# Patient Record
Sex: Male | Born: 1955
Health system: Southern US, Community
[De-identification: ages and names within clinical notes are randomized; demographics above are authoritative.]

## PROBLEM LIST (undated history)

## (undated) DIAGNOSIS — I5041 Acute combined systolic (congestive) and diastolic (congestive) heart failure: Secondary | ICD-10-CM

## (undated) DIAGNOSIS — B955 Unspecified streptococcus as the cause of diseases classified elsewhere: Secondary | ICD-10-CM

## (undated) DIAGNOSIS — A491 Streptococcal infection, unspecified site: Secondary | ICD-10-CM

## (undated) DIAGNOSIS — K7460 Unspecified cirrhosis of liver: Secondary | ICD-10-CM

## (undated) DIAGNOSIS — R7881 Bacteremia: Secondary | ICD-10-CM

## (undated) DIAGNOSIS — I10 Essential (primary) hypertension: Secondary | ICD-10-CM

## (undated) DIAGNOSIS — T783XXA Angioneurotic edema, initial encounter: Secondary | ICD-10-CM

## (undated) DIAGNOSIS — I82409 Acute embolism and thrombosis of unspecified deep veins of unspecified lower extremity: Secondary | ICD-10-CM

## (undated) DIAGNOSIS — M795 Residual foreign body in soft tissue: Secondary | ICD-10-CM

## (undated) DIAGNOSIS — F1010 Alcohol abuse, uncomplicated: Secondary | ICD-10-CM

## (undated) DIAGNOSIS — I4892 Unspecified atrial flutter: Secondary | ICD-10-CM

## (undated) HISTORY — PX: HAND SURGERY: SHX662

---

## 2007-11-13 ENCOUNTER — Emergency Department (HOSPITAL_COMMUNITY): Admission: EM | Admit: 2007-11-13 | Discharge: 2007-11-13 | Payer: Self-pay | Admitting: Emergency Medicine

## 2010-10-28 LAB — POCT I-STAT, CHEM 8
Calcium, Ion: 1.21
HCT: 44
Hemoglobin: 15
TCO2: 29

## 2010-10-28 LAB — URINE MICROSCOPIC-ADD ON

## 2010-10-28 LAB — URINALYSIS, ROUTINE W REFLEX MICROSCOPIC
Glucose, UA: NEGATIVE
Leukocytes, UA: NEGATIVE
Nitrite: NEGATIVE
Specific Gravity, Urine: 1.025
pH: 5.5

## 2010-10-28 LAB — GLUCOSE, CAPILLARY: Glucose-Capillary: 83

## 2010-12-02 ENCOUNTER — Emergency Department (HOSPITAL_COMMUNITY)
Admission: EM | Admit: 2010-12-02 | Discharge: 2010-12-02 | Disposition: A | Discharge status: Home / Self Care | Payer: Self-pay | Attending: Emergency Medicine | Admitting: Emergency Medicine

## 2010-12-02 ENCOUNTER — Emergency Department (HOSPITAL_COMMUNITY): Payer: Self-pay

## 2010-12-02 ENCOUNTER — Encounter: Payer: Self-pay | Admitting: Emergency Medicine

## 2010-12-02 DIAGNOSIS — Z794 Long term (current) use of insulin: Secondary | ICD-10-CM | POA: Insufficient documentation

## 2010-12-02 DIAGNOSIS — M940 Chondrocostal junction syndrome [Tietze]: Secondary | ICD-10-CM | POA: Insufficient documentation

## 2010-12-02 DIAGNOSIS — E119 Type 2 diabetes mellitus without complications: Secondary | ICD-10-CM | POA: Insufficient documentation

## 2010-12-02 DIAGNOSIS — I10 Essential (primary) hypertension: Secondary | ICD-10-CM | POA: Insufficient documentation

## 2010-12-02 DIAGNOSIS — R071 Chest pain on breathing: Secondary | ICD-10-CM | POA: Insufficient documentation

## 2010-12-02 HISTORY — DX: Essential (primary) hypertension: I10

## 2010-12-02 MED ORDER — HYDROCODONE-ACETAMINOPHEN 5-325 MG PO TABS: 1.0000 | ORAL_TABLET | ORAL | Status: AC | PRN

## 2010-12-02 NOTE — ED Notes (Signed)
Patient c/o of right flank pain. Denies any nausea, vomiting, fevers, or urinary problems. Patient denies any known injuries.

## 2010-12-02 NOTE — ED Provider Notes (Signed)
History     CSN: [id number] Arrival date & time: 12/02/2010  9:49 AM   First MD Initiated Contact with Patient 12/02/10 1006      Chief Complaint  Patient presents with  . Rib Injury    (Consider location/radiation/quality/duration/timing/severity/associated sxs/prior treatment) Patient is a 55 y.o. male presenting with chest pain. The history is provided by the patient.  Chest Pain Episode onset: 3 weeks ago.  He denies injury,  fall,  heavy lifting , etc. Chest pain occurs intermittently. The chest pain is unchanged. Associated with: Movement and lying on his right side. At its most intense, the pain is at 8/10. The pain is currently at 8/10. The severity of the pain is moderate. The quality of the pain is described as sharp and stabbing. The pain does not radiate. Chest pain is worsened by certain positions. Pertinent negatives for primary symptoms include no fever, no shortness of breath, no cough, no wheezing, no palpitations, no abdominal pain, no nausea and no dizziness.  Pertinent negatives for associated symptoms include no diaphoresis, no lower extremity edema, no numbness, no orthopnea and no weakness. He tried narcotics (He gets relief with hydrocodone.) for the symptoms.  His past medical history is significant for diabetes and hypertension.     Past Medical History  Diagnosis Date  . Hypertension   . Diabetes mellitus     History reviewed. No pertinent past surgical history.  Family History  Problem Relation Age of Onset  . Diabetes Mother     History  Substance Use Topics  . Smoking status: Never Smoker   . Smokeless tobacco: Never Used  . Alcohol Use: No      Review of Systems  Constitutional: Negative for fever and diaphoresis.  HENT: Negative for congestion, sore throat and neck pain.   Eyes: Negative.   Respiratory: Negative for cough, chest tightness, shortness of breath and wheezing.   Cardiovascular: Positive for chest pain. Negative for  palpitations, orthopnea and leg swelling.  Gastrointestinal: Negative for nausea and abdominal pain.  Genitourinary: Negative.  Negative for dysuria, frequency and difficulty urinating.  Musculoskeletal: Negative for joint swelling and arthralgias.  Skin: Negative.  Negative for rash and wound.  Neurological: Negative for dizziness, weakness, light-headedness, numbness and headaches.  Hematological: Negative.   Psychiatric/Behavioral: Negative.     Allergies  Other  Home Medications   Current Outpatient Rx  Name Route Sig Dispense Refill  . HYDROCODONE-ACETAMINOPHEN 5-500 MG PO TABS Oral Take 1 tablet by mouth 2 (two) times daily.      . INSULIN DETEMIR 100 UNIT/ML [place] SOLN Subcutaneous Inject 60 Units into the skin daily.      Marland Kitchen METFORMIN HCL 500 MG PO TABS Oral Take 500 mg by mouth daily.        BP 185/97  Pulse 74  Temp(Src) 98.3 F (36.8 C) (Oral)  Resp 17  Ht 6\' 1"  (1.854 m)  Wt 280 lb (127.007 kg)  BMI 36.94 kg/m2  SpO2 99%  Physical Exam  Nursing note and vitals reviewed. Constitutional: He is oriented to person, place, and time. He appears well-developed and well-nourished.  HENT:  Head: Normocephalic and atraumatic.  Eyes: Conjunctivae are normal.  Neck: Normal range of motion.  Cardiovascular: Normal rate, regular rhythm, normal heart sounds and intact distal pulses.  Exam reveals no friction rub.   No murmur heard. Pulmonary/Chest: Effort normal and breath sounds normal. No respiratory distress. He has no wheezes. He has no rales. He exhibits tenderness. He exhibits no  crepitus, no edema, no deformity and no swelling.    Abdominal: Soft. Bowel sounds are normal. There is no tenderness.  Musculoskeletal: Normal range of motion.  Neurological: He is alert and oriented to person, place, and time.  Skin: Skin is warm and dry.  Psychiatric: He has a normal mood and affect.    ED Course  Procedures (including critical care time)  Labs Reviewed - No data to  display Dg Ribs Unilateral W/chest Right  12/02/2010  *RADIOLOGY REPORT*  Clinical Data: Right anterior rib pain x 3 weeks, no known injury  RIGHT RIBS AND CHEST - 3+ VIEW  Comparison: None.  Findings: Lungs are clear. No pleural effusion or pneumothorax.  Cardiomediastinal silhouette is within normal limits.  No right rib fracture is seen.  IMPRESSION: No evidence of acute cardiopulmonary disease.  No right rib fracture is seen.  Original Report Authenticated By: Charline Bills, M.D.     No diagnosis found.    MDM  Chest wall pain. Hypertension        Candis Musa, PA 12/02/10 1222

## 2010-12-05 NOTE — ED Provider Notes (Signed)
Medical screening examination/treatment/procedure(s) were performed by non-physician practitioner and as supervising physician I was immediately available for consultation/collaboration.   Benny Lennert, MD 12/05/10 [phone]

## 2010-12-24 ENCOUNTER — Other Ambulatory Visit (HOSPITAL_COMMUNITY): Payer: Self-pay | Admitting: "Endocrinology

## 2010-12-24 ENCOUNTER — Ambulatory Visit (HOSPITAL_COMMUNITY)
Admission: RE | Admit: 2010-12-24 | Discharge: 2010-12-24 | Disposition: A | Discharge status: Home / Self Care | Payer: Medicaid Other | Source: Ambulatory Visit | Attending: "Endocrinology | Admitting: "Endocrinology

## 2010-12-24 DIAGNOSIS — R0789 Other chest pain: Secondary | ICD-10-CM

## 2010-12-24 DIAGNOSIS — R079 Chest pain, unspecified: Secondary | ICD-10-CM | POA: Insufficient documentation

## 2011-01-11 ENCOUNTER — Emergency Department (INDEPENDENT_AMBULATORY_CARE_PROVIDER_SITE_OTHER): Payer: Medicaid Other

## 2011-01-11 ENCOUNTER — Encounter (HOSPITAL_COMMUNITY): Payer: Self-pay | Admitting: *Deleted

## 2011-01-11 ENCOUNTER — Other Ambulatory Visit: Payer: Self-pay

## 2011-01-11 ENCOUNTER — Emergency Department (INDEPENDENT_AMBULATORY_CARE_PROVIDER_SITE_OTHER)
Admission: EM | Admit: 2011-01-11 | Discharge: 2011-01-11 | Disposition: A | Discharge status: Home / Self Care | Payer: Medicaid Other | Source: Home / Self Care | Attending: Emergency Medicine | Admitting: Emergency Medicine

## 2011-01-11 DIAGNOSIS — R0789 Other chest pain: Secondary | ICD-10-CM

## 2011-01-11 MED ORDER — PREDNISONE 20 MG PO TABS: ORAL_TABLET | ORAL | Status: AC

## 2011-01-11 MED ORDER — MELOXICAM 15 MG PO TABS: 15.0000 mg | ORAL_TABLET | Freq: Every day | ORAL | Status: AC

## 2011-01-11 MED ORDER — HYDROCODONE-ACETAMINOPHEN 5-325 MG PO TABS: 2.0000 | ORAL_TABLET | ORAL | Status: AC | PRN

## 2011-01-11 NOTE — ED Notes (Signed)
C/O intermittent chest pain x 1 month - reports "it's there every morning when I wake up".  States had CXR per PCP for sxs, but negative results.  Denies having cardiac work-up.  Describes pain as sharp, intermittent to right anterior lower ribs; pain worse with palpation.  Denies SOB, nausea, change w/ deep breathing.  Occasionally pain is worse with certain movement.  Has been taking hydrocodone for back pain (now resolved) - takes care of the rib/chest pain, but pain returns.

## 2011-01-11 NOTE — ED Provider Notes (Signed)
History     CSN: [id number] Arrival date & time: 01/11/2011 12:31 PM   First MD Initiated Contact with Patient 01/11/11 1242      Chief Complaint  Patient presents with  . Chest Pain    HPI Comments: Pt with intermittent sharp nonradiating anterior lower CP x 6 weeks. Lasts seconds/minutes and resolves. Worse with certain movements, palpation. No exertional component.  Seen in ED on 11/6 for identical pain, thought to have costochondritis, sent home with vicodin which pt states helps. Has not tried anything else for this pain. No rash. No recent or remote h/o trauma to this area.   Patient is a 55 y.o. male presenting with chest pain. The history is provided by the patient.  Chest Pain The chest pain began more  than 1 month ago. Chest pain occurs intermittently. The chest pain is unchanged. The pain is associated with breathing and coughing. The quality of the pain is described as sharp and similar to previous episodes. The pain does not radiate. Pertinent negatives for primary symptoms include no fever, no fatigue, no syncope, no shortness of breath, no cough, no wheezing, no palpitations, no abdominal pain, no nausea, no vomiting and no dizziness.  Pertinent negatives for associated symptoms include no diaphoresis, no lower extremity edema, no near-syncope, no numbness and no weakness. He tried narcotics for the symptoms. Risk factors include male gender and obesity.  His past medical history is significant for hypertension.     Past Medical History  Diagnosis Date  . Hypertension   . Diabetes mellitus     History reviewed. No pertinent past surgical history.  Family History  Problem Relation Age of Onset  . Diabetes Mother     History  Substance Use Topics  . Smoking status: Never Smoker   . Smokeless tobacco: Never Used  . Alcohol Use: No      Review of Systems  Constitutional: Negative for fever, diaphoresis and fatigue.  Respiratory: Negative for cough, chest  tightness, shortness of breath and wheezing.   Cardiovascular: Positive for chest pain. Negative for palpitations, leg swelling, syncope and near-syncope.  Gastrointestinal: Negative for nausea, vomiting and abdominal pain.  Musculoskeletal: Negative for back pain.  Skin: Negative for rash.  Neurological: Negative for dizziness, weakness and numbness.    Allergies  Other  Home Medications   Current Outpatient Rx  Name Route Sig Dispense Refill  . INSULIN DETEMIR 100 UNIT/ML [place] SOLN Subcutaneous Inject 60 Units into the skin daily.      Marland Kitchen LISINOPRIL PO Oral Take by mouth.      . METFORMIN HCL 500 MG PO TABS Oral Take 500 mg by mouth daily.      Marland Kitchen HYDROCODONE-ACETAMINOPHEN 5-325 MG PO TABS Oral Take 2 tablets by mouth every 4 (four) hours as needed for pain. 20 tablet 0  . MELOXICAM 15 MG PO TABS Oral Take 1 tablet (15 mg total) by mouth daily. 14 tablet 0  . PREDNISONE 20 MG PO TABS  Take 3 tabs po on first day, 2 tabs second day, 2 tabs third day, 1 tab fourth day, 1 tab 5th day. Take with food. 9 tablet 0  . UNKNOWN TO PATIENT  Blood pressure medicine - unknown name      BP 181/83  Pulse 81  Temp(Src) 97.8 F (36.6 C) (Oral)  Resp 20  SpO2 96%  Physical Exam  Nursing note and vitals reviewed. Constitutional: He is oriented to person, place, and time. He appears well-developed and well-nourished.  HENT:  Head: Normocephalic and atraumatic.  Eyes: Conjunctivae and EOM are normal. Pupils are equal, round, and reactive to light.  Neck: Normal range of motion.  Cardiovascular: Normal rate, regular rhythm, normal heart sounds and intact distal pulses.  Exam reveals no friction rub.   No murmur heard. Pulmonary/Chest: Effort normal and breath sounds normal. No respiratory distress. He exhibits tenderness. He exhibits no crepitus, no deformity and no swelling.         Tenderness at costochondral junction. Good inspiratory effort.  Abdominal: Soft. Bowel sounds are normal. He  exhibits no distension and no mass. There is no tenderness. There is no rebound and no guarding.  Musculoskeletal: Normal range of motion.  Neurological: He is alert and oriented to person, place, and time.  Skin: Skin is warm and dry. No rash noted.  Psychiatric: He has a normal mood and affect. His behavior is normal.    ED Course  Procedures (including critical care time)  Labs Reviewed - No data to display Dg Chest 2 View  01/11/2011  *RADIOLOGY REPORT*  Clinical Data: Chest pain for 6 weeks  CHEST - 2 VIEW  Comparison: None  Findings: The heart size and mediastinal contours are within normal limits.  Both lungs are clear.  The visualized skeletal structures are unremarkable.  IMPRESSION: No active cardiopulmonary abnormalities.  Original Report Authenticated By: Rosealee Albee, M.D.     1. Musculoskeletal chest pain       MDM  Prev records, labs, imaging reviewed. BP noted. Was in this range on last visit on 12/02/2010.  Imaging reviewed by myself. Discussed with rads. No PNA, [facility] per them. [place] narcotic database queried. 1 narcotic Rx from ED on 11/6. No other record of narcotic rx. Prev BUN/cr WNL. Pt reports no h/o renal insufficiency.   Pt appears to be in NAD, non-toxic, VSS. No evidence of STEMI or acute ischemic changes on EKG. Doubt ACS, PE, ruptured esophagus, aortic dissection, PNA or PTX. H&P most c/w MSK CP. Will send home with meloxicam, steriods, and vicodin for presumed costochondritis. Instructed pt that prednisone will elevate his blood sugar and discussed importance of taking BP medication/ checking BP at home. Pt to f/u with PMD at the [place] in 2 weeks if no improvement.    Date: 01/11/2011  Rate: 73  Rhythm: NSR. 1 PVC noted.   QRS Axis: normal  Intervals: normal  ST/T Wave abnormalities: nonspecific T wave changes  Conduction Disutrbances:none  Narrative Interpretation:   Old EKG Reviewed: none available   Luiz Blare, MD 01/11/11 1453

## 2014-04-13 ENCOUNTER — Emergency Department (HOSPITAL_COMMUNITY): Payer: Medicare Other

## 2014-04-13 ENCOUNTER — Inpatient Hospital Stay (HOSPITAL_COMMUNITY)
Admission: EM | Admit: 2014-04-13 | Discharge: 2014-04-30 | DRG: 291 | Disposition: A | Discharge status: Skilled Nursing Facility | Payer: Medicare Other | Attending: Family Medicine | Admitting: Family Medicine

## 2014-04-13 ENCOUNTER — Encounter (HOSPITAL_COMMUNITY): Payer: Self-pay | Admitting: Emergency Medicine

## 2014-04-13 DIAGNOSIS — I10 Essential (primary) hypertension: Secondary | ICD-10-CM | POA: Diagnosis not present

## 2014-04-13 DIAGNOSIS — N179 Acute kidney failure, unspecified: Secondary | ICD-10-CM | POA: Diagnosis present

## 2014-04-13 DIAGNOSIS — F10931 Alcohol use, unspecified with withdrawal delirium: Secondary | ICD-10-CM | POA: Diagnosis present

## 2014-04-13 DIAGNOSIS — Z794 Long term (current) use of insulin: Secondary | ICD-10-CM | POA: Diagnosis not present

## 2014-04-13 DIAGNOSIS — D649 Anemia, unspecified: Secondary | ICD-10-CM | POA: Diagnosis present

## 2014-04-13 DIAGNOSIS — K7460 Unspecified cirrhosis of liver: Secondary | ICD-10-CM | POA: Diagnosis present

## 2014-04-13 DIAGNOSIS — R220 Localized swelling, mass and lump, head: Secondary | ICD-10-CM | POA: Diagnosis not present

## 2014-04-13 DIAGNOSIS — I5031 Acute diastolic (congestive) heart failure: Secondary | ICD-10-CM | POA: Diagnosis not present

## 2014-04-13 DIAGNOSIS — R0602 Shortness of breath: Secondary | ICD-10-CM | POA: Diagnosis not present

## 2014-04-13 DIAGNOSIS — I5041 Acute combined systolic (congestive) and diastolic (congestive) heart failure: Secondary | ICD-10-CM | POA: Diagnosis not present

## 2014-04-13 DIAGNOSIS — M795 Residual foreign body in soft tissue: Secondary | ICD-10-CM | POA: Diagnosis not present

## 2014-04-13 DIAGNOSIS — I4892 Unspecified atrial flutter: Secondary | ICD-10-CM | POA: Diagnosis not present

## 2014-04-13 DIAGNOSIS — Y95 Nosocomial condition: Secondary | ICD-10-CM | POA: Diagnosis present

## 2014-04-13 DIAGNOSIS — R000 Tachycardia, unspecified: Secondary | ICD-10-CM | POA: Diagnosis not present

## 2014-04-13 DIAGNOSIS — A408 Other streptococcal sepsis: Secondary | ICD-10-CM | POA: Diagnosis not present

## 2014-04-13 DIAGNOSIS — Z452 Encounter for adjustment and management of vascular access device: Secondary | ICD-10-CM | POA: Diagnosis not present

## 2014-04-13 DIAGNOSIS — A409 Streptococcal sepsis, unspecified: Secondary | ICD-10-CM | POA: Diagnosis not present

## 2014-04-13 DIAGNOSIS — R0600 Dyspnea, unspecified: Secondary | ICD-10-CM

## 2014-04-13 DIAGNOSIS — T783XXA Angioneurotic edema, initial encounter: Secondary | ICD-10-CM | POA: Diagnosis present

## 2014-04-13 DIAGNOSIS — I82409 Acute embolism and thrombosis of unspecified deep veins of unspecified lower extremity: Secondary | ICD-10-CM

## 2014-04-13 DIAGNOSIS — I471 Supraventricular tachycardia: Secondary | ICD-10-CM

## 2014-04-13 DIAGNOSIS — R778 Other specified abnormalities of plasma proteins: Secondary | ICD-10-CM

## 2014-04-13 DIAGNOSIS — I426 Alcoholic cardiomyopathy: Secondary | ICD-10-CM | POA: Diagnosis present

## 2014-04-13 DIAGNOSIS — I5043 Acute on chronic combined systolic (congestive) and diastolic (congestive) heart failure: Principal | ICD-10-CM | POA: Diagnosis present

## 2014-04-13 DIAGNOSIS — T502X5A Adverse effect of carbonic-anhydrase inhibitors, benzothiadiazides and other diuretics, initial encounter: Secondary | ICD-10-CM | POA: Diagnosis present

## 2014-04-13 DIAGNOSIS — I509 Heart failure, unspecified: Secondary | ICD-10-CM | POA: Diagnosis not present

## 2014-04-13 DIAGNOSIS — R918 Other nonspecific abnormal finding of lung field: Secondary | ICD-10-CM | POA: Diagnosis not present

## 2014-04-13 DIAGNOSIS — B955 Unspecified streptococcus as the cause of diseases classified elsewhere: Secondary | ICD-10-CM

## 2014-04-13 DIAGNOSIS — E669 Obesity, unspecified: Secondary | ICD-10-CM | POA: Diagnosis present

## 2014-04-13 DIAGNOSIS — Z833 Family history of diabetes mellitus: Secondary | ICD-10-CM

## 2014-04-13 DIAGNOSIS — I2699 Other pulmonary embolism without acute cor pulmonale: Secondary | ICD-10-CM | POA: Diagnosis present

## 2014-04-13 DIAGNOSIS — T464X5A Adverse effect of angiotensin-converting-enzyme inhibitors, initial encounter: Secondary | ICD-10-CM | POA: Diagnosis present

## 2014-04-13 DIAGNOSIS — E876 Hypokalemia: Secondary | ICD-10-CM | POA: Diagnosis present

## 2014-04-13 DIAGNOSIS — J690 Pneumonitis due to inhalation of food and vomit: Secondary | ICD-10-CM | POA: Diagnosis not present

## 2014-04-13 DIAGNOSIS — I517 Cardiomegaly: Secondary | ICD-10-CM | POA: Diagnosis not present

## 2014-04-13 DIAGNOSIS — A491 Streptococcal infection, unspecified site: Secondary | ICD-10-CM

## 2014-04-13 DIAGNOSIS — I472 Ventricular tachycardia: Secondary | ICD-10-CM | POA: Diagnosis present

## 2014-04-13 DIAGNOSIS — E119 Type 2 diabetes mellitus without complications: Secondary | ICD-10-CM

## 2014-04-13 DIAGNOSIS — S60551A Superficial foreign body of right hand, initial encounter: Secondary | ICD-10-CM | POA: Diagnosis not present

## 2014-04-13 DIAGNOSIS — I82812 Embolism and thrombosis of superficial veins of left lower extremities: Secondary | ICD-10-CM | POA: Diagnosis not present

## 2014-04-13 DIAGNOSIS — E1165 Type 2 diabetes mellitus with hyperglycemia: Secondary | ICD-10-CM | POA: Diagnosis present

## 2014-04-13 DIAGNOSIS — J9811 Atelectasis: Secondary | ICD-10-CM | POA: Diagnosis not present

## 2014-04-13 DIAGNOSIS — E870 Hyperosmolality and hypernatremia: Secondary | ICD-10-CM | POA: Diagnosis not present

## 2014-04-13 DIAGNOSIS — T884XXA Failed or difficult intubation, initial encounter: Secondary | ICD-10-CM

## 2014-04-13 DIAGNOSIS — R748 Abnormal levels of other serum enzymes: Secondary | ICD-10-CM | POA: Diagnosis not present

## 2014-04-13 DIAGNOSIS — R600 Localized edema: Secondary | ICD-10-CM

## 2014-04-13 DIAGNOSIS — I4891 Unspecified atrial fibrillation: Secondary | ICD-10-CM | POA: Diagnosis present

## 2014-04-13 DIAGNOSIS — Z4682 Encounter for fitting and adjustment of non-vascular catheter: Secondary | ICD-10-CM | POA: Diagnosis not present

## 2014-04-13 DIAGNOSIS — J9690 Respiratory failure, unspecified, unspecified whether with hypoxia or hypercapnia: Secondary | ICD-10-CM

## 2014-04-13 DIAGNOSIS — Z9911 Dependence on respirator [ventilator] status: Secondary | ICD-10-CM

## 2014-04-13 DIAGNOSIS — J9601 Acute respiratory failure with hypoxia: Secondary | ICD-10-CM | POA: Diagnosis present

## 2014-04-13 DIAGNOSIS — R188 Other ascites: Secondary | ICD-10-CM | POA: Diagnosis not present

## 2014-04-13 DIAGNOSIS — I5040 Unspecified combined systolic (congestive) and diastolic (congestive) heart failure: Secondary | ICD-10-CM | POA: Diagnosis not present

## 2014-04-13 DIAGNOSIS — I82401 Acute embolism and thrombosis of unspecified deep veins of right lower extremity: Secondary | ICD-10-CM | POA: Diagnosis not present

## 2014-04-13 DIAGNOSIS — I82443 Acute embolism and thrombosis of tibial vein, bilateral: Secondary | ICD-10-CM | POA: Diagnosis not present

## 2014-04-13 DIAGNOSIS — F10231 Alcohol dependence with withdrawal delirium: Secondary | ICD-10-CM | POA: Diagnosis present

## 2014-04-13 DIAGNOSIS — T783XXS Angioneurotic edema, sequela: Secondary | ICD-10-CM | POA: Diagnosis not present

## 2014-04-13 DIAGNOSIS — A419 Sepsis, unspecified organism: Secondary | ICD-10-CM

## 2014-04-13 DIAGNOSIS — I82403 Acute embolism and thrombosis of unspecified deep veins of lower extremity, bilateral: Secondary | ICD-10-CM | POA: Diagnosis not present

## 2014-04-13 DIAGNOSIS — R7989 Other specified abnormal findings of blood chemistry: Secondary | ICD-10-CM

## 2014-04-13 DIAGNOSIS — F1010 Alcohol abuse, uncomplicated: Secondary | ICD-10-CM

## 2014-04-13 DIAGNOSIS — T82898A Other specified complication of vascular prosthetic devices, implants and grafts, initial encounter: Secondary | ICD-10-CM | POA: Diagnosis not present

## 2014-04-13 DIAGNOSIS — T783XXD Angioneurotic edema, subsequent encounter: Secondary | ICD-10-CM | POA: Diagnosis not present

## 2014-04-13 DIAGNOSIS — J9691 Respiratory failure, unspecified with hypoxia: Secondary | ICD-10-CM | POA: Diagnosis not present

## 2014-04-13 DIAGNOSIS — M7989 Other specified soft tissue disorders: Secondary | ICD-10-CM | POA: Diagnosis not present

## 2014-04-13 DIAGNOSIS — R7881 Bacteremia: Secondary | ICD-10-CM

## 2014-04-13 DIAGNOSIS — Z9889 Other specified postprocedural states: Secondary | ICD-10-CM

## 2014-04-13 DIAGNOSIS — B954 Other streptococcus as the cause of diseases classified elsewhere: Secondary | ICD-10-CM | POA: Diagnosis not present

## 2014-04-13 DIAGNOSIS — R6520 Severe sepsis without septic shock: Secondary | ICD-10-CM | POA: Diagnosis not present

## 2014-04-13 DIAGNOSIS — R109 Unspecified abdominal pain: Secondary | ICD-10-CM

## 2014-04-13 DIAGNOSIS — R0989 Other specified symptoms and signs involving the circulatory and respiratory systems: Secondary | ICD-10-CM | POA: Diagnosis not present

## 2014-04-13 HISTORY — DX: Residual foreign body in soft tissue: M79.5

## 2014-04-13 HISTORY — DX: Alcohol abuse, uncomplicated: F10.10

## 2014-04-13 HISTORY — DX: Acute combined systolic (congestive) and diastolic (congestive) heart failure: I50.41

## 2014-04-13 HISTORY — DX: Acute embolism and thrombosis of unspecified deep veins of unspecified lower extremity: I82.409

## 2014-04-13 HISTORY — DX: Angioneurotic edema, initial encounter: T78.3XXA

## 2014-04-13 HISTORY — DX: Bacteremia: R78.81

## 2014-04-13 HISTORY — DX: Streptococcal infection, unspecified site: A49.1

## 2014-04-13 HISTORY — DX: Unspecified streptococcus as the cause of diseases classified elsewhere: B95.5

## 2014-04-13 HISTORY — DX: Unspecified cirrhosis of liver: K74.60

## 2014-04-13 HISTORY — DX: Unspecified atrial flutter: I48.92

## 2014-04-13 LAB — TSH: TSH: 1.213 u[IU]/mL (ref 0.350–4.500)

## 2014-04-13 LAB — BASIC METABOLIC PANEL
ANION GAP: 10 (ref 5–15)
BUN: 29 mg/dL — ABNORMAL HIGH (ref 6–23)
CALCIUM: 9.2 mg/dL (ref 8.4–10.5)
CO2: 26 mmol/L (ref 19–32)
Chloride: 99 mmol/L (ref 96–112)
Creatinine, Ser: 1.98 mg/dL — ABNORMAL HIGH (ref 0.50–1.35)
GFR calc Af Amer: 40 mL/min — ABNORMAL LOW (ref >90)
GFR calc non Af Amer: 35 mL/min — ABNORMAL LOW (ref >90)
Glucose, Bld: 95 mg/dL (ref 70–99)
Potassium: 3.7 mmol/L (ref 3.5–5.1)
SODIUM: 135 mmol/L (ref 135–145)

## 2014-04-13 LAB — TROPONIN I
TROPONIN I: 0.09 ng/mL — AB (ref <0.031)
Troponin I: 0.08 ng/mL — ABNORMAL HIGH (ref <0.031)
Troponin I: 0.05 ng/mL — ABNORMAL HIGH (ref <0.031)

## 2014-04-13 LAB — HEPATIC FUNCTION PANEL
ALBUMIN: 3.7 g/dL (ref 3.5–5.2)
ALT: 30 U/L (ref 0–53)
AST: 61 U/L — AB (ref 0–37)
Alkaline Phosphatase: 97 U/L (ref 39–117)
BILIRUBIN TOTAL: 2.1 mg/dL — AB (ref 0.3–1.2)
Bilirubin, Direct: 0.8 mg/dL — ABNORMAL HIGH (ref 0.0–0.5)
Indirect Bilirubin: 1.3 mg/dL — ABNORMAL HIGH (ref 0.3–0.9)
Total Protein: 7.2 g/dL (ref 6.0–8.3)

## 2014-04-13 LAB — CBC WITH DIFFERENTIAL/PLATELET
BASOS ABS: 0 10*3/uL (ref 0.0–0.1)
BASOS PCT: 1 % (ref 0–1)
Eosinophils Absolute: 0.2 10*3/uL (ref 0.0–0.7)
Eosinophils Relative: 3 % (ref 0–5)
HEMATOCRIT: 38.9 % — AB (ref 39.0–52.0)
Hemoglobin: 13.2 g/dL (ref 13.0–17.0)
Lymphocytes Relative: 23 % (ref 12–46)
Lymphs Abs: 1.5 10*3/uL (ref 0.7–4.0)
MCH: 31.9 pg (ref 26.0–34.0)
MCHC: 33.9 g/dL (ref 30.0–36.0)
MCV: 94 fL (ref 78.0–100.0)
MONO ABS: 0.6 10*3/uL (ref 0.1–1.0)
Monocytes Relative: 9 % (ref 3–12)
NEUTROS ABS: 4.3 10*3/uL (ref 1.7–7.7)
NEUTROS PCT: 64 % (ref 43–77)
Platelets: 151 10*3/uL (ref 150–400)
RBC: 4.14 MIL/uL — ABNORMAL LOW (ref 4.22–5.81)
RDW: 13.4 % (ref 11.5–15.5)
WBC: 6.7 10*3/uL (ref 4.0–10.5)

## 2014-04-13 LAB — GLUCOSE, CAPILLARY: Glucose-Capillary: 123 mg/dL — ABNORMAL HIGH (ref 70–99)

## 2014-04-13 LAB — BRAIN NATRIURETIC PEPTIDE: B NATRIURETIC PEPTIDE 5: 617 pg/mL — AB (ref 0.0–100.0)

## 2014-04-13 LAB — MRSA PCR SCREENING: MRSA BY PCR: POSITIVE — AB

## 2014-04-13 MED ORDER — DILTIAZEM LOAD VIA INFUSION
15.0000 mg | Freq: Once | INTRAVENOUS | Status: DC
  Filled 2014-04-13: qty 15

## 2014-04-13 MED ORDER — DILTIAZEM HCL 100 MG IV SOLR
10.0000 mg/h | INTRAVENOUS | Status: DC
  Administered 2014-04-14 (×2): 10 mg/h via INTRAVENOUS
  Administered 2014-04-14: 7.5 mg/h via INTRAVENOUS
  Filled 2014-04-13: qty 100

## 2014-04-13 MED ORDER — INSULIN ASPART 100 UNIT/ML ~~LOC~~ SOLN
0.0000 [IU] | Freq: Three times a day (TID) | SUBCUTANEOUS | Status: DC
  Administered 2014-04-13: 2 [IU] via SUBCUTANEOUS
  Administered 2014-04-14 (×2): 3 [IU] via SUBCUTANEOUS
  Administered 2014-04-14: 5 [IU] via SUBCUTANEOUS
  Administered 2014-04-16: 2 [IU] via SUBCUTANEOUS

## 2014-04-13 MED ORDER — METHYLPREDNISOLONE SODIUM SUCC 125 MG IJ SOLR
80.0000 mg | Freq: Once | INTRAMUSCULAR | Status: AC
  Administered 2014-04-13: 80 mg via INTRAVENOUS
  Filled 2014-04-13: qty 2

## 2014-04-13 MED ORDER — THIAMINE HCL 100 MG/ML IJ SOLN
100.0000 mg | Freq: Every day | INTRAMUSCULAR | Status: DC
  Administered 2014-04-16 – 2014-04-22 (×2): 100 mg via INTRAVENOUS
  Filled 2014-04-13 (×2): qty 2

## 2014-04-13 MED ORDER — DIPHENHYDRAMINE HCL 50 MG/ML IJ SOLN
25.0000 mg | Freq: Four times a day (QID) | INTRAMUSCULAR | Status: DC | PRN
  Administered 2014-04-18 – 2014-04-23 (×7): 25 mg via INTRAVENOUS
  Filled 2014-04-13 (×8): qty 1

## 2014-04-13 MED ORDER — INSULIN DETEMIR 100 UNIT/ML ~~LOC~~ SOLN
60.0000 [IU] | Freq: Every day | SUBCUTANEOUS | Status: DC
Start: 2014-04-13 — End: 2014-04-15
  Administered 2014-04-13 – 2014-04-14 (×2): 60 [IU] via SUBCUTANEOUS
  Filled 2014-04-13 (×4): qty 0.6

## 2014-04-13 MED ORDER — FUROSEMIDE 10 MG/ML IJ SOLN
40.0000 mg | Freq: Every day | INTRAMUSCULAR | Status: DC
  Administered 2014-04-13: 40 mg via INTRAVENOUS
  Filled 2014-04-13: qty 4

## 2014-04-13 MED ORDER — LORAZEPAM 0.5 MG PO TABS
1.0000 mg | ORAL_TABLET | Freq: Four times a day (QID) | ORAL | Status: AC | PRN
  Administered 2014-04-15: 1 mg via ORAL
  Filled 2014-04-13: qty 2

## 2014-04-13 MED ORDER — INSULIN ASPART 100 UNIT/ML ~~LOC~~ SOLN
0.0000 [IU] | Freq: Every day | SUBCUTANEOUS | Status: DC
  Administered 2014-04-13: 3 [IU] via SUBCUTANEOUS

## 2014-04-13 MED ORDER — LORAZEPAM 2 MG/ML IJ SOLN: 0.0000 mg | Freq: Four times a day (QID) | INTRAMUSCULAR | Status: AC

## 2014-04-13 MED ORDER — VITAMIN B-1 100 MG PO TABS
100.0000 mg | ORAL_TABLET | Freq: Every day | ORAL | Status: DC
  Administered 2014-04-13 – 2014-04-30 (×16): 100 mg via ORAL
  Filled 2014-04-13 (×16): qty 1

## 2014-04-13 MED ORDER — ACETAMINOPHEN 325 MG PO TABS: 650.0000 mg | ORAL_TABLET | Freq: Four times a day (QID) | ORAL | Status: DC | PRN

## 2014-04-13 MED ORDER — PREDNISONE 20 MG PO TABS
40.0000 mg | ORAL_TABLET | Freq: Every day | ORAL | Status: DC
Start: 2014-04-14 — End: 2014-04-14
  Administered 2014-04-14: 40 mg via ORAL
  Filled 2014-04-13: qty 2

## 2014-04-13 MED ORDER — FUROSEMIDE 10 MG/ML IJ SOLN
40.0000 mg | Freq: Three times a day (TID) | INTRAMUSCULAR | Status: DC
  Administered 2014-04-13 – 2014-04-15 (×5): 40 mg via INTRAVENOUS
  Filled 2014-04-13 (×5): qty 4

## 2014-04-13 MED ORDER — FOLIC ACID 1 MG PO TABS
1.0000 mg | ORAL_TABLET | Freq: Every day | ORAL | Status: DC
  Administered 2014-04-13 – 2014-04-15 (×3): 1 mg via ORAL
  Filled 2014-04-13 (×3): qty 1

## 2014-04-13 MED ORDER — ONDANSETRON HCL 4 MG/2ML IJ SOLN
4.0000 mg | Freq: Four times a day (QID) | INTRAMUSCULAR | Status: DC | PRN
  Administered 2014-04-23: 4 mg via INTRAVENOUS
  Filled 2014-04-13: qty 2

## 2014-04-13 MED ORDER — DILTIAZEM HCL 25 MG/5ML IV SOLN
INTRAVENOUS | Status: AC
  Administered 2014-04-13: 10 mg via INTRAVENOUS
  Filled 2014-04-13: qty 5

## 2014-04-13 MED ORDER — ACETAMINOPHEN 650 MG RE SUPP: 650.0000 mg | Freq: Four times a day (QID) | RECTAL | Status: DC | PRN

## 2014-04-13 MED ORDER — LORAZEPAM 2 MG/ML IJ SOLN
1.0000 mg | Freq: Once | INTRAMUSCULAR | Status: AC
  Administered 2014-04-13: 1 mg via INTRAVENOUS
  Filled 2014-04-13: qty 1

## 2014-04-13 MED ORDER — DILTIAZEM HCL 100 MG IV SOLR
5.0000 mg/h | Freq: Once | INTRAVENOUS | Status: DC
  Filled 2014-04-13: qty 100

## 2014-04-13 MED ORDER — ONDANSETRON HCL 4 MG PO TABS: 4.0000 mg | ORAL_TABLET | Freq: Four times a day (QID) | ORAL | Status: DC | PRN

## 2014-04-13 MED ORDER — DILTIAZEM HCL 50 MG/10ML IV SOLN
10.0000 mg | Freq: Once | INTRAVENOUS | Status: AC
  Administered 2014-04-13: 10 mg via INTRAVENOUS

## 2014-04-13 MED ORDER — LORAZEPAM 2 MG/ML IJ SOLN
0.0000 mg | Freq: Two times a day (BID) | INTRAMUSCULAR | Status: AC
Start: 2014-04-15 — End: 2014-04-17
  Administered 2014-04-15 – 2014-04-16 (×2): 2 mg via INTRAVENOUS
  Administered 2014-04-16 – 2014-04-17 (×2): 4 mg via INTRAVENOUS
  Filled 2014-04-13 (×2): qty 2
  Filled 2014-04-13: qty 1
  Filled 2014-04-13: qty 2

## 2014-04-13 MED ORDER — SODIUM CHLORIDE 0.9 % IJ SOLN
3.0000 mL | Freq: Two times a day (BID) | INTRAMUSCULAR | Status: DC
  Administered 2014-04-13 – 2014-04-29 (×16): 3 mL via INTRAVENOUS

## 2014-04-13 MED ORDER — ADULT MULTIVITAMIN W/MINERALS CH
1.0000 | ORAL_TABLET | Freq: Every day | ORAL | Status: DC
  Administered 2014-04-13 – 2014-04-15 (×3): 1 via ORAL
  Filled 2014-04-13 (×3): qty 1

## 2014-04-13 MED ORDER — ASPIRIN 325 MG PO TABS
325.0000 mg | ORAL_TABLET | Freq: Once | ORAL | Status: AC
Start: 2014-04-13 — End: 2014-04-13
  Administered 2014-04-13: 325 mg via ORAL
  Filled 2014-04-13: qty 1

## 2014-04-13 MED ORDER — HEPARIN SODIUM (PORCINE) 5000 UNIT/ML IJ SOLN
5000.0000 [IU] | Freq: Three times a day (TID) | INTRAMUSCULAR | Status: DC
  Administered 2014-04-13 – 2014-04-16 (×10): 5000 [IU] via SUBCUTANEOUS
  Filled 2014-04-13 (×10): qty 1

## 2014-04-13 MED ORDER — LORAZEPAM 2 MG/ML IJ SOLN: 1.0000 mg | Freq: Four times a day (QID) | INTRAMUSCULAR | Status: AC | PRN

## 2014-04-13 NOTE — ED Notes (Signed)
EDP in room to see pt. 

## 2014-04-13 NOTE — H&P (Signed)
[facility] History and Physical  Gabriel SeminoleRalph E Merk EAV:[id number] DOB: [birth year] DOA: 04/13/2014  Referring physician: Emergency Department PCP: Pcp Not In System  Specialists:   Chief Complaint: LE swelling  HPI: Gabriel Reynolds is a 59 y.o. male  With a hx of DM who presents with complaints of generalized edema and facial swelling in the setting of lisinopril. In the ED, the patient was noted to have marked LE edema with an enlarged heart seen on CXR. BNP was elevated at 617 with initial troponin of 0.09. The patient was also tachycardic with HR in the 120's, with patient receiving diltazem in ED. Cardiology was consulted and hospitalist was consulted for consideration for admission.  Review of Systems: Review of Systems  Constitutional: Negative for fever, weight loss and diaphoresis.  HENT: Negative for ear pain.   Eyes: Negative for pain and discharge.  Respiratory: Positive for cough. Negative for shortness of breath and stridor.   Cardiovascular: Positive for leg swelling. Negative for chest pain and orthopnea.  Gastrointestinal: Negative for nausea and abdominal pain.  Genitourinary: Negative for dysuria and urgency.  Musculoskeletal: Negative for back pain and joint pain.  Neurological: Negative for seizures and loss of consciousness.  Psychiatric/Behavioral: Negative for depression. The patient is not nervous/anxious.      Past Medical History  Diagnosis Date  . Hypertension   . Diabetes mellitus    Past Surgical History  Procedure Laterality Date  . Hand surgery     Social History:  reports that he has never smoked. He has never used smokeless tobacco. He reports that he drinks about 4.2 oz of alcohol per week. He reports that he does not use illicit drugs.  where does patient live--home, ALF, SNF? and with whom if at home?  Can patient participate in ADLs?  Allergies  Allergen Reactions  . Other Hives    wool    Family History  Problem Relation Age of  Onset  . Diabetes Mother     (be sure to complete)  Prior to Admission medications   Medication Sig Start Date End Date Taking? Authorizing Provider  insulin detemir (LEVEMIR) 100 UNIT/ML injection Inject 60 Units into the skin daily.     Yes Historical Provider, MD  metFORMIN (GLUCOPHAGE) 500 MG tablet Take 500 mg by mouth daily.     Yes Historical Provider, MD   Physical Exam: Filed Vitals:   04/13/14 1040  BP: 134/99  Pulse: 125  Temp: 98.5 F (36.9 C)  TempSrc: Oral  Resp: 18  Height: 6\' 1"  (1.854 m)  Weight: 127.007 kg (280 lb)  SpO2: 99%     General:  Awake, in nad  Eyes: PERRL B  ENT: membranes moist, dentition fair  Neck: trachea midline, neck supple  Cardiovascular: tachycardic, s1, s2  Respiratory: normal resp effort, no wheezing  Abdomen: soft,nondistended  Skin: normal skin turgor, no abnormal skin lesions seen  Musculoskeletal: perfused, no clubbing, 2-3+ LE pitting edema  Psychiatric: mood/affect normal // no auditory/visual hallucinations  Neurologic: cn2-12 intact, strength/sensation intact  Labs on Admission:  Basic Metabolic Panel:  Recent Labs Lab 04/13/14 1110  NA 135  K 3.7  CL 99  CO2 26  GLUCOSE 95  BUN 29*  CREATININE 1.98*  CALCIUM 9.2   Liver Function Tests: No results for input(s): AST, ALT, ALKPHOS, BILITOT, PROT, ALBUMIN in the last 168 hours. No results for input(s): LIPASE, AMYLASE in the last 168 hours. No results for input(s): AMMONIA in the last 168 hours. CBC:  Recent Labs Lab 04/13/14 1110  WBC 6.7  NEUTROABS 4.3  HGB 13.2  HCT 38.9*  MCV 94.0  PLT 151   Cardiac Enzymes:  Recent Labs Lab 04/13/14 1110  TROPONINI 0.09*    BNP (last 3 results)  Recent Labs  04/13/14 1110  BNP 617.0*    ProBNP (last 3 results) No results for input(s): PROBNP in the last 8760 hours.  CBG: No results for input(s): GLUCAP in the last 168 hours.  Radiological Exams on Admission: Dg Chest 2  View  04/13/2014   CLINICAL DATA:  Facial and bilateral leg swelling for 2 weeks.  EXAM: CHEST  2 VIEW  COMPARISON:  01/11/2011  FINDINGS: Mild cardiomegaly, which has developed since previous. Aortic and hilar contours are stable.  No edema or convincing effusion. There is chronic coarsening of lung markings, especially at the right base. No pneumonia or pneumothorax.  IMPRESSION: 1. Cardiomegaly which has developed since 2012. 2. No edema or pneumonia.   Electronically Signed   By: Marnee Spring M.D.   On: 04/13/2014 12:18    Assessment/Plan Principal Problem:   CHF exacerbation Active Problems:   Diabetes type 2, controlled   Angioedema   Tachycardia  1. Suspected acutely decompensated CHF exacerbation 1. Elevated BNP with enlarged heart on CXR 2. Cardiology consulted 3. Will check 2d echo 4. Will start IV lasix 5. Monitor I/o and daily weights 6. Admit to stepdown 2. DM2 1. Cont on home lantus 2. Add SSI coverage 3. Angioedema 1. Suspect related to lisinopril 2. Hold med, start steroids and benadryl 4. Tachycardia 1. Cardiology consulted 2. Start cardizem gtt 3. Follow in stepdown 5. DVT prophylaxis 1. Heparin subQ  Code Status: Full Family Communication: Pt in room, wife at bedside Disposition Plan: Admit to med-tele   CHIU, Scheryl Marten [facility] Pager [phone]  If 7PM-7AM, please contact night-coverage [email or web address] Password [facility] 04/13/2014, 2:07 PM

## 2014-04-13 NOTE — ED Notes (Signed)
Pt reports onset of feet and leg edema 2 weeks ago. Pt now has facial swelling x 1 day.

## 2014-04-13 NOTE — ED Notes (Signed)
Cardizem gtt ordered per hospitalist. Gtt started. Order in Epic placed as one time dose. Verbal gtt order verified with Physician and started. Order received from Dr Deno Etiennehu.

## 2014-04-13 NOTE — Consult Note (Signed)
CARDIOLOGY CONSULT NOTE   Patient ID: Gabriel Reynolds MRN: [id number] DOB/AGE: 1955/04/09 59 y.o.  Admit Date: 04/13/2014 Referring Physician: PTH [facility]  Consulting Cardiologist: Prentice Docker  Primary Cardiologist:  New Reason for Consultation: Diastolic CHF and tachycardia   Clinical Summary Mr. Overbeck is a 59 y.o.male admitted with generalized edema, tachycardia, and elevated troponin of 0.09. He has a history of hypertension and diabetes. He was admitted for diureses and cardiology work up. He has not seen a cardiologist in the past. He is followed by the [place] in [place].  His wife states that the symptoms began appx two weeks ago with swelling in his left leg, then moving to his right. She states that the swelling got worse over the last week with abdominal distention,lip and facial edema.  He has been on lisinopril for over a year without allergic reaction. He also states that he has had lower BG and had to decrease insulin doses recently.   In ER EKG demonstrated tachycardia rate of  127 bpm with PVC's. BP 134/99, HR 125, O2 Sat 99%. Creatinine 1.98. K+ 3.7. Pro-BNP 617.0 CXR Cardiomegaly, no edema or pneumonia. HE was treated with diltazem IV bolus and gtt, lasix 40 mg IV, ativan, steroids, as he was thought to be having allergic reaction to lisinopril.  He drinks several 12 oz beers a day. He is vague on the amount, but his wife states he drinks a good bit.  He denies chest pain, dyspnea, or NV. He is medically compliant. He states he has had no results from lasix in ER.   Allergies  Allergen Reactions  . Other Hives    wool    Medications Scheduled Medications: . diltiazem  15 mg Intravenous Once  . furosemide  40 mg Intravenous Daily  . heparin  5,000 Units Subcutaneous 3 times per day  . insulin aspart  0-15 Units Subcutaneous TID WC  . insulin aspart  0-5 Units Subcutaneous QHS  . insulin detemir  60 Units Subcutaneous Daily  . [START ON  04/14/2014] predniSONE  40 mg Oral Q breakfast  . sodium chloride  3 mL Intravenous Q12H    Infusions:     PRN Medications: acetaminophen **OR** acetaminophen, diphenhydrAMINE, ondansetron **OR** ondansetron (ZOFRAN) IV   Past Medical History  Diagnosis Date  . Hypertension   . Diabetes mellitus     Past Surgical History  Procedure Laterality Date  . Hand surgery      Family History  Problem Relation Age of Onset  . Diabetes Mother     Social History Mr. Prosperi reports that he has never smoked. He has never used smokeless tobacco. Mr. Gosch reports that he drinks about 4.2 oz of alcohol per week.  Review of Systems Complete review of systems are found to be negative unless outlined in H&P above.  Physical Examination Blood pressure 119/90, pulse 98, temperature 97.7 F (36.5 C), temperature source Oral, resp. rate 29, height  (1.854 m), weight 300 lb 7.8 oz (136.3 kg), SpO2 99 %. No intake or output data in the 24 hours ending 04/13/14 1614  Telemetry: Sinus tachycardia rates in the 120's to 130's.   GEN: No acute distress. HEENT: Conjunctiva and lids edematous , lips edematous. . Neck: Supple, no elevated JVP or carotid bruits, no thyromegaly. Lungs: Clear to auscultation, nonlabored breathing at rest. Cardiac: Tachycardic with regular rhythm, no S3 or significant systolic murmur, no pericardial rub. Abdomen: Distended, nontender, no hepatomegaly, bowel sounds  present, no guarding or rebound. Extremities: 1+pretibial pitting edema of the left leg, not on the right., distal pulses 2+. Skin: Warm and dry. Musculoskeletal: No kyphosis. Neuropsychiatric: Alert and oriented x3, affect grossly appropriate.  Prior Cardiac Testing/Procedures NONE  Lab Results  Basic Metabolic Panel:  Recent Labs Lab 04/13/14 1110  NA 135  K 3.7  CL 99  CO2 26  GLUCOSE 95  BUN 29*  CREATININE 1.98*  CALCIUM 9.2   CBC:  Recent Labs Lab 04/13/14 1110  WBC 6.7    NEUTROABS 4.3  HGB 13.2  HCT 38.9*  MCV 94.0  PLT 151    Cardiac Enzymes:  Recent Labs Lab 04/13/14 1110  TROPONINI 0.09*    Radiology: Dg Chest 2 View  04/13/2014   CLINICAL DATA:  Facial and bilateral leg swelling for 2 weeks.  EXAM: CHEST  2 VIEW  COMPARISON:  01/11/2011  FINDINGS: Mild cardiomegaly, which has developed since previous. Aortic and hilar contours are stable.  No edema or convincing effusion. There is chronic coarsening of lung markings, especially at the right base. No pneumonia or pneumothorax.  IMPRESSION: 1. Cardiomegaly which has developed since 2012. 2. No edema or pneumonia.   Electronically Signed   By: Marnee SpringJonathon  Watts M.D.   On: 04/13/2014 12:18     ECG: Sinus tachycardiac rate of 127 bpm. Some PVC's.    Impression and Recommendations  1. Acute Diastolic CHF: He still has some evidence of fluid overload in the LE with abdominal distention. He received one dose of IV lasix X 1 in ER with no output per patient. Will begin 40 mg IV TID. Abdomen is distended may be from fluid.   2.Tachycardia: Heart rate is not well controlled on current dose of IV diltiazem 5 mg/hr will increase to 10 mghr in and effort to slow him down. Echo has been ordered.  3. Hypertension: BP is normalized on diltiazem gtt.   4. Diabetes: States that his blood glucose is lower and he has had to lower the dose of insulin. Not eating due to feelings of fullness.   5. ETOH Abuse: Per his wife. He down plays the amount. May need DT precautions.    Discussed this with Dr. Purvis SheffieldKoneswaran. Please see his notes below.   Signed: Bettey MareKathryn M. Lawrence NP [facility]  04/13/2014, 4:14 PM Co-Sign MD  The patient was seen and examined, and I agree with the assessment and plan as documented above, with modifications as noted below. Pt with aforementioned history pertinent of increasing leg swelling, dyspnea, and cough over past week which has gradually been getting worse, followed by two days of facial  and lip swelling. Denies history of heart, kidney, and liver disease. Wife not present during my evaluation, but she had previously reported that patient drinks a significant amount of alcohol daily. Denies chest pain altogether, as well as orthopnea, palpitations, and paroxysmal nocturnal dyspnea. CXR demonstrated cardiomegaly without CHF. Labs notable for minimally elevated troponin (consistent with demand ischemia) and trending down, acute renal failure (baseline creatinine unknown), elevated BNP, and hyperbilirubinemia. Telemetry demonstrates sinus tachycardia on a diltiazem infusion. Overall impression concerning for a cardiomyopathy with acute decompensation. Unclear whether systolic or diastolic, but diastolic BP's markedly elevated. Also with h/o alcoholism, alcoholic cardiomyopathy may also be playing a role. TSH normal. Has been taking lisinopril for quite some time so unlikely for this to represent an acute allergic reaction. Elevated bilirubin with normal Hgb suggestive of hepatobiliary disease vs congestive hepatopathy.  Recommendations: Would attempt diuresis  with Lasix IV 40 mg tid. Obtain echocardiogram with contrast to assess LV size, systolic and diastolic function, and regional wall motion. No murmurs on exam to suggest significant valvular heart disease. Tachycardia likely represents physiologic response to underlying pathology, and will likely improve with diuresis and optimal medical therapy. Ok to use diltiazem infusion for now. Given alcohol abuse, consider abdominal ultrasound.

## 2014-04-13 NOTE — ED Provider Notes (Addendum)
CSN: [id number]     Arrival date & time 04/13/14  1021 History  This chart was scribed for Shon Baton, MD by Tonye Royalty, ED Scribe. This patient was seen in room APA09/APA09 and the patient's care was started at 10:56 AM.    Chief Complaint  Patient presents with  . Facial Swelling  . Leg Swelling   The history is provided by the patient. No language interpreter was used.    HPI Comments: Gabriel Reynolds is a 59 y.o. male who presents to the Emergency Department complaining of swelling in legs with onset 2 weeks ago as well as facial swelling with onset 2 days ago. He states leg swelling is not worse after standing. He states he has never had similar facial swelling before. He denies any new foods or medications. He uses Lisinopril. He denies history of CHF. He states he has history of HTN and DM. He also uses insulin and Metformin. He denies smoking but does use alcohol. He denies tongue swelling or difficulty swallowing, SOB, chest pain, or abdominal pain.  [facility]  Past Medical History  Diagnosis Date  . Hypertension   . Diabetes mellitus    Past Surgical History  Procedure Laterality Date  . Hand surgery     Family History  Problem Relation Age of Onset  . Diabetes Mother    History  Substance Use Topics  . Smoking status: Never Smoker   . Smokeless tobacco: Never Used  . Alcohol Use: 4.2 oz/week    7 Cans of beer per week    Review of Systems  Constitutional: Negative.  Negative for fever.  HENT: Positive for facial swelling.   Respiratory: Negative.  Negative for chest tightness and shortness of breath.   Cardiovascular: Positive for leg swelling. Negative for chest pain.  Gastrointestinal: Negative.  Negative for abdominal pain.  Genitourinary: Negative.  Negative for dysuria.  Neurological: Negative for headaches.  All other systems reviewed and are negative.     Allergies  Other  Home Medications   Prior to Admission medications    Medication Sig Start Date End Date Taking? Authorizing Provider  insulin detemir (LEVEMIR) 100 UNIT/ML injection Inject 60 Units into the skin daily.     Yes Historical Provider, MD  metFORMIN (GLUCOPHAGE) 500 MG tablet Take 500 mg by mouth daily.     Yes Historical Provider, MD   BP 134/99 mmHg  Pulse 125  Temp(Src) 98.5 F (36.9 C) (Oral)  Resp 18  Ht  (1.854 m)  Wt 280 lb (127.007 kg)  BMI 36.95 kg/m2  SpO2 99% Physical Exam  Constitutional: He is oriented to person, place, and time.  HENT:  Head: Normocephalic and atraumatic.  Diffuse swelling of the upper and lower lips, no tonsillar or pharyngeal swelling noted, airway patent  Eyes: Pupils are equal, round, and reactive to light.  Neck: Neck supple. JVD present.  Cardiovascular: Regular rhythm and normal heart sounds.   No murmur heard. Tachycardia  Pulmonary/Chest: Effort normal and breath sounds normal. No respiratory distress. He has no wheezes.  Abdominal: Soft. Bowel sounds are normal. There is no tenderness. There is no rebound.  Musculoskeletal: He exhibits edema.  3+ pitting edema of the bilateral lower extremities to the knee  Neurological: He is alert and oriented to person, place, and time.  Skin: Skin is warm and dry.  Psychiatric: He has a normal mood and affect.  Nursing note and vitals reviewed.   ED Course  Procedures (  including critical care time)  CRITICAL CARE Performed by: Shon Baton   Total critical care time: 35 min  Critical care time was exclusive of separately billable procedures and treating other patients.  Critical care was necessary to treat or prevent imminent or life-threatening deterioration.  Critical care was time spent personally by me on the following activities: development of treatment plan with patient and/or surrogate as well as nursing, discussions with consultants, evaluation of patient's response to treatment, examination of patient, obtaining history from  patient or surrogate, ordering and performing treatments and interventions, ordering and review of laboratory studies, ordering and review of radiographic studies, pulse oximetry and re-evaluation of patient's condition.   DIAGNOSTIC STUDIES: Oxygen Saturation is 99% on room air, normal by my interpretation.    COORDINATION OF CARE: 10:59 AM Discussed treatment plan with patient at beside, the patient agrees with the plan and has no further questions at this time.   Labs Review Labs Reviewed  CBC WITH DIFFERENTIAL/PLATELET - Abnormal; Notable for the following:    RBC 4.14 (*)    HCT 38.9 (*)    All other components within normal limits  BASIC METABOLIC PANEL - Abnormal; Notable for the following:    BUN 29 (*)    Creatinine, Ser 1.98 (*)    GFR calc non Af Amer 35 (*)    GFR calc Af Amer 40 (*)    All other components within normal limits  BRAIN NATRIURETIC PEPTIDE - Abnormal; Notable for the following:    B Natriuretic Peptide 617.0 (*)    All other components within normal limits  TROPONIN I - Abnormal; Notable for the following:    Troponin I 0.09 (*)    All other components within normal limits    Imaging Review Dg Chest 2 View  04/13/2014   CLINICAL DATA:  Facial and bilateral leg swelling for 2 weeks.  EXAM: CHEST  2 VIEW  COMPARISON:  01/11/2011  FINDINGS: Mild cardiomegaly, which has developed since previous. Aortic and hilar contours are stable.  No edema or convincing effusion. There is chronic coarsening of lung markings, especially at the right base. No pneumonia or pneumothorax.  IMPRESSION: 1. Cardiomegaly which has developed since 2012. 2. No edema or pneumonia.   Electronically Signed   By: Marnee Spring M.D.   On: 04/13/2014 12:18     EKG Interpretation   Date/Time:  Friday April 13 2014 11:09:32 EDT Ventricular Rate:  127 PR Interval:  173 QRS Duration: 91 QT Interval:  308 QTC Calculation: 448 R Axis:   130 Text Interpretation:  Ectopic atrial  tachycardia, unifocal Multiform  ventricular premature complexes Anterior infarct, old Repol abnrm suggests  ischemia, diffuse leads multiple changes when compared to prior Confirmed  by HORTON  MD, COURTNEY ([id number]) on 04/13/2014 11:15:44 AM      MDM   Final diagnoses:  Angioedema, initial encounter  Bilateral edema of lower extremity  Elevated troponin  Atrial tachycardia    Patient presents with swelling of the face in the bilateral feet. Vital signs notable for tachycardia in the 120s. Denies any chest pain or shortness of breath. However, screening EKG given tachycardia shows PVCs and Q waves which were not present with prior. No known history of heart disease. Troponin and BNP sent.  Workup notable for increased creatinine at 1.98, BNP is 617 and troponin of 0.09. Patient given full dose aspirin. Chest x-ray shows cardiomegaly without vascular congestion.  Patient does report that he drinks 1 beer every  couple of days. He denies daily drinking; however, wife at the bedside states she thinks he probably drinks daily. This could be contributed tachycardia. Patient given Ativan and diltiazem IV bolus in an effort to manage tachycardia.  Cardiology to consult given elevated troponin, abnormal EKG. Will admit to medicine  I personally performed the services described in this documentation, which was scribed in my presence. The recorded information has been reviewed and is accurate.   Shon Batonourtney F Horton, MD 04/13/14 1339  No significant response to one bolus dose of diltiazem. Will place on diltiazem drip.  Shon Batonourtney F Horton, MD 04/13/14 1340

## 2014-04-14 ENCOUNTER — Inpatient Hospital Stay (HOSPITAL_COMMUNITY): Payer: Medicare Other

## 2014-04-14 DIAGNOSIS — I509 Heart failure, unspecified: Secondary | ICD-10-CM

## 2014-04-14 LAB — CBC
HCT: 38.6 % — ABNORMAL LOW (ref 39.0–52.0)
Hemoglobin: 13.1 g/dL (ref 13.0–17.0)
MCH: 32 pg (ref 26.0–34.0)
MCHC: 33.9 g/dL (ref 30.0–36.0)
MCV: 94.4 fL (ref 78.0–100.0)
Platelets: 150 10*3/uL (ref 150–400)
RBC: 4.09 MIL/uL — ABNORMAL LOW (ref 4.22–5.81)
RDW: 13.3 % (ref 11.5–15.5)
WBC: 4.9 10*3/uL (ref 4.0–10.5)

## 2014-04-14 LAB — COMPREHENSIVE METABOLIC PANEL
ALT: 30 U/L (ref 0–53)
AST: 52 U/L — AB (ref 0–37)
Albumin: 3.4 g/dL — ABNORMAL LOW (ref 3.5–5.2)
Alkaline Phosphatase: 95 U/L (ref 39–117)
Anion gap: 10 (ref 5–15)
BUN: 37 mg/dL — ABNORMAL HIGH (ref 6–23)
CO2: 25 mmol/L (ref 19–32)
CREATININE: 2.33 mg/dL — AB (ref 0.50–1.35)
Calcium: 9.1 mg/dL (ref 8.4–10.5)
Chloride: 99 mmol/L (ref 96–112)
GFR calc Af Amer: 33 mL/min — ABNORMAL LOW (ref >90)
GFR calc non Af Amer: 29 mL/min — ABNORMAL LOW (ref >90)
Glucose, Bld: 223 mg/dL — ABNORMAL HIGH (ref 70–99)
Potassium: 4.4 mmol/L (ref 3.5–5.1)
Sodium: 134 mmol/L — ABNORMAL LOW (ref 135–145)
TOTAL PROTEIN: 6.9 g/dL (ref 6.0–8.3)
Total Bilirubin: 1.8 mg/dL — ABNORMAL HIGH (ref 0.3–1.2)

## 2014-04-14 LAB — LIPID PANEL
CHOLESTEROL: 145 mg/dL (ref 0–200)
HDL: 43 mg/dL (ref >39)
LDL Cholesterol: 90 mg/dL (ref 0–99)
Total CHOL/HDL Ratio: 3.4 RATIO
Triglycerides: 60 mg/dL (ref <150)
VLDL: 12 mg/dL (ref 0–40)

## 2014-04-14 LAB — HEMOGLOBIN A1C
Hgb A1c MFr Bld: 5.2 % (ref 4.8–5.6)
MEAN PLASMA GLUCOSE: 103 mg/dL

## 2014-04-14 LAB — GLUCOSE, CAPILLARY: GLUCOSE-CAPILLARY: 276 mg/dL — AB (ref 70–99)

## 2014-04-14 LAB — TROPONIN I: Troponin I: 0.04 ng/mL — ABNORMAL HIGH (ref <0.031)

## 2014-04-14 MED ORDER — CHLORHEXIDINE GLUCONATE CLOTH 2 % EX PADS
6.0000 | MEDICATED_PAD | Freq: Every day | CUTANEOUS | Status: AC
  Administered 2014-04-14 – 2014-04-18 (×6): 6 via TOPICAL

## 2014-04-14 MED ORDER — PREDNISONE 20 MG PO TABS
20.0000 mg | ORAL_TABLET | Freq: Every day | ORAL | Status: DC
  Administered 2014-04-15: 20 mg via ORAL
  Filled 2014-04-14: qty 1

## 2014-04-14 MED ORDER — DILTIAZEM HCL 30 MG PO TABS: 30.0000 mg | ORAL_TABLET | Freq: Two times a day (BID) | ORAL | Status: DC

## 2014-04-14 MED ORDER — DILTIAZEM HCL 30 MG PO TABS
30.0000 mg | ORAL_TABLET | Freq: Two times a day (BID) | ORAL | Status: DC
  Administered 2014-04-14 – 2014-04-15 (×3): 30 mg via ORAL
  Filled 2014-04-14 (×3): qty 1

## 2014-04-14 MED ORDER — MUPIROCIN 2 % EX OINT
1.0000 "application " | TOPICAL_OINTMENT | Freq: Two times a day (BID) | CUTANEOUS | Status: AC
  Administered 2014-04-14 – 2014-04-18 (×9): 1 via NASAL
  Filled 2014-04-14 (×3): qty 22

## 2014-04-14 MED ORDER — NAPHAZOLINE HCL 0.1 % OP SOLN
2.0000 [drp] | Freq: Four times a day (QID) | OPHTHALMIC | Status: DC | PRN
  Administered 2014-04-17: 2 [drp] via OPHTHALMIC
  Filled 2014-04-14: qty 15

## 2014-04-14 NOTE — Progress Notes (Signed)
[facility] PROGRESS NOTE  Gabriel SeminoleRalph E Simmerman WUJ:[id number] DOB: 1956-01-10 DOA: 04/13/2014 PCP: Pcp Not In System  Assessment/Plan: 1. Suspected acutely decompensated CHF exacerbation 1. Elevated BNP with enlarged heart on CXR 2. Cardiology was consulted and is following 3. 2d echo done 3/19. Awaiting results 4. Currently on IV lasix TID 5. Monitor I/o and daily weights 6. TSH normal. Consideration for ETOH cardiomyopathy given significant reported ETOH abuse 2. DM2 1. Cont on home lantus 2. Add SSI coverage 3. Angioedema 1. Suspect related to lisinopril, now on hold 2. Started steroids and benadryl 3. Improving 4. Tachycardia 1. Cardiology consulted per above 2. Started cardizem gtt 3. Rate better controlled 5. DVT prophylaxis 1. Heparin subQ 6. ETOH abuse 1. Pt downplays his ETOH intake, but it is reportedly significant 2. CIWA ordered 3. Pt with evidence of cirrhosis per below 7. Cirrhosis, likely alcoholic 1. Evidence of cirrhosis on liver US 2. LFT's trending down 3. Avoid hepatotoxic agents  Code Status: Full Family Communication: Pt in room (indicate person spoken with, relationship, and if by phone, the number) Disposition Plan: Pending   Consultants:  Cardiology  Procedures:    Antibiotics:   (indicate start date, and stop date if known)  HPI/Subjective: Reports facial swelling improved  Objective: Filed Vitals:   04/14/14 1159 04/14/14 1200 04/14/14 1215 04/14/14 1230  BP:  129/78 104/58 103/65  Pulse:  46 61 103  Temp: 97.5 F (36.4 C)     TempSrc: Oral     Resp:  28 29 25   Height:      Weight:      SpO2:  96% 95% 97%    Intake/Output Summary (Last 24 hours) at 04/14/14 1359 Last data filed at 04/14/14 1200  Gross per 24 hour  Intake    480 ml  Output      0 ml  Net    480 ml   Filed Weights   04/13/14 1040 04/13/14 1548 04/14/14 0500  Weight: 127.007 kg (280 lb) 136.3 kg (300 lb 7.8 oz) 136.5 kg (300 lb 14.9 oz)     Exam:   General:  Awake, in nad  Cardiovascular: regular, s1, s2  Respiratory: normal resp effort, no wheezing  Abdomen: soft, obese, nondistended  Musculoskeletal: perfused, no clubbing   Data Reviewed: Basic Metabolic Panel:  Recent Labs Lab 04/13/14 1110 04/14/14 0427  NA 135 134*  K 3.7 4.4  CL 99 99  CO2 26 25  GLUCOSE 95 223*  BUN 29* 37*  CREATININE 1.98* 2.33*  CALCIUM 9.2 9.1   Liver Function Tests:  Recent Labs Lab 04/13/14 1545 04/14/14 0427  AST 61* 52*  ALT 30 30  ALKPHOS 97 95  BILITOT 2.1* 1.8*  PROT 7.2 6.9  ALBUMIN 3.7 3.4*   No results for input(s): LIPASE, AMYLASE in the last 168 hours. No results for input(s): AMMONIA in the last 168 hours. CBC:  Recent Labs Lab 04/13/14 1110 04/14/14 0427  WBC 6.7 4.9  NEUTROABS 4.3  --   HGB 13.2 13.1  HCT 38.9* 38.6*  MCV 94.0 94.4  PLT 151 150   Cardiac Enzymes:  Recent Labs Lab 04/13/14 1110 04/13/14 1545 04/13/14 2224 04/14/14 0427  TROPONINI 0.09* 0.08* 0.05* 0.04*   BNP (last 3 results)  Recent Labs  04/13/14 1110  BNP 617.0*    ProBNP (last 3 results) No results for input(s): PROBNP in the last 8760 hours.  CBG:  Recent Labs Lab 04/13/14 1615 04/13/14 2022  GLUCAP 123* 276*  Recent Results (from the past 240 hour(s))  MRSA PCR Screening     Status: Abnormal   Collection Time: 04/13/14  4:00 PM  Result Value Ref Range Status   MRSA by PCR POSITIVE (A) NEGATIVE Final    Comment:        The GeneXpert MRSA Assay (FDA approved for NASAL specimens only), is one component of a comprehensive MRSA colonization surveillance program. It is not intended to diagnose MRSA infection nor to guide or monitor treatment for MRSA infections. RESULT CALLED TO, READ BACK BY AND VERIFIED WITH:  CUMMINGS,R @ 2205 ON 04/13/14 BY WOODIE,J      Studies: Dg Chest 2 View  04/13/2014   CLINICAL DATA:  Facial and bilateral leg swelling for 2 weeks.  EXAM: CHEST  2 VIEW   COMPARISON:  01/11/2011  FINDINGS: Mild cardiomegaly, which has developed since previous. Aortic and hilar contours are stable.  No edema or convincing effusion. There is chronic coarsening of lung markings, especially at the right base. No pneumonia or pneumothorax.  IMPRESSION: 1. Cardiomegaly which has developed since 2012. 2. No edema or pneumonia.   Electronically Signed   By: Marnee Spring M.D.   On: 04/13/2014 12:18   US Abdomen Limited Ruq  04/14/2014   CLINICAL DATA:  Ethanol abuse  EXAM: US ABDOMEN LIMITED - RIGHT UPPER QUADRANT  COMPARISON:  None.  FINDINGS: Gallbladder:  No gallstones or wall thickening visualized. No sonographic Murphy sign noted.  Common bile duct:  Diameter: Normal diameter at 4 mm.  Liver:  There is uniform increase in liver echogenicity. No biliary duct dilatation. Fine nodular contour to the liver. No ascites.  IMPRESSION: 1. Normal gallbladder. 2. Echogenic liver with fine nodular contour suggests mild cirrhosis. 3. No ascites.   Electronically Signed   By: Genevive Bi M.D.   On: 04/14/2014 13:18    Scheduled Meds: . Chlorhexidine Gluconate Cloth  6 each Topical Q0600  . folic acid  1 mg Oral Daily  . furosemide  40 mg Intravenous TID  . heparin  5,000 Units Subcutaneous 3 times per day  . insulin aspart  0-15 Units Subcutaneous TID WC  . insulin aspart  0-5 Units Subcutaneous QHS  . insulin detemir  60 Units Subcutaneous Daily  . LORazepam  0-4 mg Intravenous Q6H   Followed by  . [START ON 04/15/2014] LORazepam  0-4 mg Intravenous Q12H  . multivitamin with minerals  1 tablet Oral Daily  . mupirocin ointment  1 application Nasal BID  . predniSONE  40 mg Oral Q breakfast  . sodium chloride  3 mL Intravenous Q12H  . thiamine  100 mg Oral Daily   Or  . thiamine  100 mg Intravenous Daily   Continuous Infusions: . diltiazem (CARDIZEM) infusion Stopped (04/14/14 1204)    Principal Problem:   CHF exacerbation Active Problems:   Diabetes type 2,  controlled   Angioedema   Tachycardia    CHIU, STEPHEN K  [facility] Pager [phone]. If 7PM-7AM, please contact night-coverage at [email or web address], password [facility] 04/14/2014, 1:59 PM  LOS: 1 day

## 2014-04-14 NOTE — Progress Notes (Signed)
Cardizem gtt restarted, d/t HR in 120 s.

## 2014-04-14 NOTE — Progress Notes (Signed)
Echocardiogram 2D Echocardiogram has been performed.  Estelle GrumblesMyers, Tarena Gockley J 04/14/2014, 9:49 AM

## 2014-04-15 ENCOUNTER — Inpatient Hospital Stay (HOSPITAL_COMMUNITY): Payer: Medicare Other

## 2014-04-15 ENCOUNTER — Inpatient Hospital Stay (HOSPITAL_COMMUNITY): Payer: Medicare Other | Admitting: Anesthesiology

## 2014-04-15 DIAGNOSIS — I5041 Acute combined systolic (congestive) and diastolic (congestive) heart failure: Secondary | ICD-10-CM

## 2014-04-15 LAB — GLUCOSE, CAPILLARY
GLUCOSE-CAPILLARY: 188 mg/dL — AB (ref 70–99)
GLUCOSE-CAPILLARY: 108 mg/dL — AB (ref 70–99)
GLUCOSE-CAPILLARY: 69 mg/dL — AB (ref 70–99)
GLUCOSE-CAPILLARY: 93 mg/dL (ref 70–99)
Glucose-Capillary: 216 mg/dL — ABNORMAL HIGH (ref 70–99)
Glucose-Capillary: 159 mg/dL — ABNORMAL HIGH (ref 70–99)
Glucose-Capillary: 76 mg/dL (ref 70–99)
Glucose-Capillary: 97 mg/dL (ref 70–99)

## 2014-04-15 LAB — BLOOD GAS, ARTERIAL
Acid-base deficit: 9.1 mmol/L — ABNORMAL HIGH (ref 0.0–2.0)
Acid-base deficit: 7.5 mmol/L — ABNORMAL HIGH (ref 0.0–2.0)
BICARBONATE: 13.5 meq/L — AB (ref 20.0–24.0)
Bicarbonate: 17 mEq/L — ABNORMAL LOW (ref 20.0–24.0)
Drawn by: 22223
FIO2: 100 %
MECHVT: 700 mL
O2 Content: 3 L/min
O2 Saturation: 92.6 %
O2 Saturation: 98 %
PATIENT TEMPERATURE: 37
PEEP: 5 cmH2O
PH ART: 7.507 — AB (ref 7.350–7.450)
PH ART: 7.348 — AB (ref 7.350–7.450)
PO2 ART: 65 mmHg — AB (ref 80.0–100.0)
Patient temperature: 37
RATE: 12 resp/min
TCO2: 11.7 mmol/L (ref 0–100)
TCO2: 15.1 mmol/L (ref 0–100)
pCO2 arterial: 17.2 mmHg — CL (ref 35.0–45.0)
pCO2 arterial: 31.8 mmHg — ABNORMAL LOW (ref 35.0–45.0)
pO2, Arterial: 134 mmHg — ABNORMAL HIGH (ref 80.0–100.0)

## 2014-04-15 LAB — COMPREHENSIVE METABOLIC PANEL
ALBUMIN: 3.7 g/dL (ref 3.5–5.2)
ALT: 28 U/L (ref 0–53)
AST: 43 U/L — ABNORMAL HIGH (ref 0–37)
Alkaline Phosphatase: 91 U/L (ref 39–117)
Anion gap: 12 (ref 5–15)
BUN: 47 mg/dL — ABNORMAL HIGH (ref 6–23)
CHLORIDE: 99 mmol/L (ref 96–112)
CO2: 22 mmol/L (ref 19–32)
CREATININE: 3.07 mg/dL — AB (ref 0.50–1.35)
Calcium: 9.6 mg/dL (ref 8.4–10.5)
GFR calc Af Amer: 24 mL/min — ABNORMAL LOW (ref >90)
GFR calc non Af Amer: 20 mL/min — ABNORMAL LOW (ref >90)
Glucose, Bld: 79 mg/dL (ref 70–99)
Potassium: 4 mmol/L (ref 3.5–5.1)
Sodium: 133 mmol/L — ABNORMAL LOW (ref 135–145)
Total Bilirubin: 1.4 mg/dL — ABNORMAL HIGH (ref 0.3–1.2)
Total Protein: 7.2 g/dL (ref 6.0–8.3)

## 2014-04-15 LAB — CBC
HCT: 40.1 % (ref 39.0–52.0)
Hemoglobin: 13.4 g/dL (ref 13.0–17.0)
MCH: 31.5 pg (ref 26.0–34.0)
MCHC: 33.4 g/dL (ref 30.0–36.0)
MCV: 94.4 fL (ref 78.0–100.0)
PLATELETS: 187 10*3/uL (ref 150–400)
RBC: 4.25 MIL/uL (ref 4.22–5.81)
RDW: 13.5 % (ref 11.5–15.5)
WBC: 12.1 10*3/uL — AB (ref 4.0–10.5)

## 2014-04-15 LAB — LACTIC ACID, PLASMA: LACTIC ACID, VENOUS: 4.5 mmol/L — AB (ref 0.5–2.0)

## 2014-04-15 LAB — LIPASE, BLOOD: Lipase: 25 U/L (ref 11–59)

## 2014-04-15 LAB — MAGNESIUM: MAGNESIUM: 1.8 mg/dL (ref 1.5–2.5)

## 2014-04-15 MED ORDER — ASPIRIN 81 MG PO CHEW
81.0000 mg | CHEWABLE_TABLET | Freq: Every day | ORAL | Status: DC
  Administered 2014-04-15: 81 mg via ORAL
  Filled 2014-04-15: qty 1

## 2014-04-15 MED ORDER — METHYLPREDNISOLONE SODIUM SUCC 125 MG IJ SOLR
125.0000 mg | INTRAMUSCULAR | Status: AC
  Administered 2014-04-15: 125 mg via INTRAVENOUS

## 2014-04-15 MED ORDER — DOPAMINE-DEXTROSE 3.2-5 MG/ML-% IV SOLN
0.0000 ug/kg/min | INTRAVENOUS | Status: DC
  Administered 2014-04-15: 5 ug/kg/min via INTRAVENOUS

## 2014-04-15 MED ORDER — ETOMIDATE 2 MG/ML IV SOLN: INTRAVENOUS | Status: DC | PRN

## 2014-04-15 MED ORDER — MIDAZOLAM HCL 2 MG/2ML IJ SOLN
INTRAMUSCULAR | Status: AC
  Administered 2014-04-15: 20:00:00
  Filled 2014-04-15: qty 4

## 2014-04-15 MED ORDER — SODIUM CHLORIDE 0.9 % IJ SOLN
10.0000 mL | INTRAMUSCULAR | Status: DC | PRN
  Administered 2014-04-17 – 2014-04-22 (×4): 10 mL
  Filled 2014-04-15 (×4): qty 40

## 2014-04-15 MED ORDER — FENTANYL CITRATE 0.05 MG/ML IJ SOLN
INTRAMUSCULAR | Status: AC
  Administered 2014-04-15: 19:00:00
  Filled 2014-04-15: qty 2

## 2014-04-15 MED ORDER — DEXMEDETOMIDINE HCL IN NACL 200 MCG/50ML IV SOLN
INTRAVENOUS | Status: AC
  Administered 2014-04-15: 200 ug via INTRAVENOUS
  Filled 2014-04-15: qty 50

## 2014-04-15 MED ORDER — SODIUM CHLORIDE 0.9 % IJ SOLN
10.0000 mL | Freq: Two times a day (BID) | INTRAMUSCULAR | Status: DC
  Administered 2014-04-15 – 2014-04-17 (×4): 10 mL
  Administered 2014-04-17 – 2014-04-18 (×2): 40 mL
  Administered 2014-04-18 – 2014-04-19 (×3): 10 mL
  Administered 2014-04-21: 40 mL
  Administered 2014-04-22 – 2014-04-30 (×9): 10 mL

## 2014-04-15 MED ORDER — METHYLPREDNISOLONE SODIUM SUCC 125 MG IJ SOLR
INTRAMUSCULAR | Status: AC
  Administered 2014-04-15: 62.5 mg
  Filled 2014-04-15: qty 2

## 2014-04-15 MED ORDER — FUROSEMIDE 10 MG/ML IJ SOLN: 60.0000 mg | Freq: Three times a day (TID) | INTRAMUSCULAR | Status: DC

## 2014-04-15 MED ORDER — LABETALOL HCL 5 MG/ML IV SOLN: 5.0000 mg | INTRAVENOUS | Status: DC | PRN

## 2014-04-15 MED ORDER — DEXMEDETOMIDINE HCL IN NACL 200 MCG/50ML IV SOLN
0.4000 ug/kg/h | INTRAVENOUS | Status: DC
  Administered 2014-04-15: 200 ug via INTRAVENOUS
  Administered 2014-04-15 – 2014-04-16 (×3): 0.4 ug/kg/h via INTRAVENOUS
  Administered 2014-04-17 (×3): 0.6 ug/kg/h via INTRAVENOUS
  Filled 2014-04-15: qty 100
  Filled 2014-04-15 (×3): qty 50
  Filled 2014-04-15 (×2): qty 100

## 2014-04-15 MED ORDER — DEXMEDETOMIDINE HCL IN NACL 200 MCG/50ML IV SOLN
INTRAVENOUS | Status: AC
  Administered 2014-04-15: 200 ug
  Filled 2014-04-15: qty 50

## 2014-04-15 MED ORDER — DOPAMINE-DEXTROSE 3.2-5 MG/ML-% IV SOLN
INTRAVENOUS | Status: AC
  Filled 2014-04-15: qty 250

## 2014-04-15 MED ORDER — FUROSEMIDE 10 MG/ML IJ SOLN
40.0000 mg | Freq: Three times a day (TID) | INTRAMUSCULAR | Status: DC
  Administered 2014-04-15 – 2014-04-16 (×2): 40 mg via INTRAVENOUS
  Filled 2014-04-15 (×2): qty 4

## 2014-04-15 MED ORDER — MIDAZOLAM HCL 2 MG/2ML IJ SOLN
4.0000 mg | Freq: Once | INTRAMUSCULAR | Status: AC
  Administered 2014-04-15 (×3): 4 mg via INTRAVENOUS
  Filled 2014-04-15: qty 4

## 2014-04-15 MED ORDER — METOPROLOL TARTRATE 50 MG PO TABS
50.0000 mg | ORAL_TABLET | Freq: Two times a day (BID) | ORAL | Status: DC
  Administered 2014-04-15: 50 mg via ORAL
  Filled 2014-04-15: qty 1

## 2014-04-15 MED ORDER — MIDAZOLAM HCL 2 MG/2ML IJ SOLN
4.0000 mg | Freq: Once | INTRAMUSCULAR | Status: AC
  Administered 2014-04-15: 4 mg via INTRAVENOUS

## 2014-04-15 MED ORDER — METOPROLOL TARTRATE 25 MG PO TABS
25.0000 mg | ORAL_TABLET | Freq: Two times a day (BID) | ORAL | Status: DC
  Administered 2014-04-15: 25 mg via ORAL
  Filled 2014-04-15: qty 1

## 2014-04-15 MED ORDER — METOPROLOL TARTRATE 25 MG PO TABS
25.0000 mg | ORAL_TABLET | Freq: Two times a day (BID) | ORAL | Status: DC
Start: 2014-04-15 — End: 2014-04-15

## 2014-04-15 MED ORDER — INSULIN DETEMIR 100 UNIT/ML ~~LOC~~ SOLN
40.0000 [IU] | Freq: Every day | SUBCUTANEOUS | Status: DC
  Administered 2014-04-15: 40 [IU] via SUBCUTANEOUS
  Filled 2014-04-15 (×3): qty 0.4

## 2014-04-15 MED ORDER — FENTANYL CITRATE 0.05 MG/ML IJ SOLN
100.0000 ug | Freq: Once | INTRAMUSCULAR | Status: AC
  Administered 2014-04-15 (×2): 100 ug via INTRAVENOUS
  Filled 2014-04-15: qty 2

## 2014-04-15 MED ORDER — ETOMIDATE 2 MG/ML IV SOLN
INTRAVENOUS | Status: DC | PRN
  Administered 2014-04-15 (×2): 10 mg via INTRAVENOUS

## 2014-04-15 MED ORDER — METHYLPREDNISOLONE SODIUM SUCC 40 MG IJ SOLR
40.0000 mg | INTRAMUSCULAR | Status: DC
  Administered 2014-04-16 – 2014-04-17 (×2): 40 mg via INTRAVENOUS
  Filled 2014-04-15 (×2): qty 1

## 2014-04-15 MED ORDER — MIDAZOLAM HCL 10 MG/2ML IJ SOLN
INTRAMUSCULAR | Status: AC
  Administered 2014-04-15: 19:00:00
  Filled 2014-04-15: qty 2

## 2014-04-15 NOTE — Progress Notes (Signed)
Bladder scanned the patient , 25 cc of urine in bladder. Will continue to monitor. Spouse visiting at the bedside.

## 2014-04-15 NOTE — Treatment Plan (Signed)
Patient noted to be increasingly confused with increased tachypnea with RR into the 40's. ABG obtained with pH of 7.5 and pCO2 of 17 with pO2 in the 60's. Pt had been continued on PRN ativan with continued tachypnea. Discussed case with Critical Care, findings suggestive of hyperventilation in the setting of suspected ETOH withdrawal. Critical Care agrees with elective intubation. Discussed with patient's family who agrees with protecting patient's airway.  Time out was called. Intubation was attempted by myself x 2. Both attempts were unsuccessful secondary to poorly visualized vocal cords. Anesthesia also at bedside. Multiple other attempts were made by anesthesia. ED staff then at bedside with successful intubation at that time. CXR ordered for tube placement. See intubation notes for drugs given during procedure.

## 2014-04-15 NOTE — Progress Notes (Signed)
Patient is stating he wants foley catheter removed. Foley removed per patients request, patient bladder scanned, no urine was revealed on the scanner. Will continue to monitor. Urinal at bedside and instructed patient to use when he felt the urge to void.

## 2014-04-15 NOTE — Anesthesia Procedure Notes (Addendum)
Procedure Name: Intubation Date/Time: 04/15/2014 7:35 PM Performed by: Franco NonesYATES, Mariella Blackwelder S Pre-anesthesia Checklist: Patient identified, Emergency Drugs available, Suction available, Patient being monitored and Timeout performed Patient Re-evaluated:Patient Re-evaluated prior to inductionOxygen Delivery Method: Ambu bag Preoxygenation: Pre-oxygenation with 100% oxygen Intubation Type: IV induction, Cricoid Pressure applied and Rapid sequence Laryngoscope Size: Glidescope and 4 Grade View: Grade II Tube type: Subglottic suction tube Tube size: 7.0 mm Number of attempts: 5 or more Airway Equipment and Method: Stylet,  Video-laryngoscopy and Oral airway Placement Confirmation: ETT inserted through vocal cords under direct vision,  CO2 detector and breath sounds checked- equal and bilateral Secured at: 21 cm Tube secured with: Tape Dental Injury: Teeth and Oropharynx as per pre-operative assessment

## 2014-04-15 NOTE — Progress Notes (Signed)
CBG 69 this am, patient is asymptomatic. 4oz orange juice given. Will recheck CBG and monitor.

## 2014-04-15 NOTE — Procedures (Signed)
Central Venous Catheter Insertion Procedure Note Geri SeminoleRalph E Rilling [id number] 1956-01-16  Procedure: Insertion of Central Venous Catheter Indications: Assessment of intravascular volume, Drug and/or fluid administration and Frequent blood sampling  Procedure Details Consent: Unable to obtain consent because of emergent medical necessity. Time Out: Verified patient identification, verified procedure, site/side was marked, verified correct patient position, special equipment/implants available, medications/allergies/relevent history reviewed, required imaging and test results available.  Performed  Maximum sterile technique was used including antiseptics, cap, gloves, gown, hand hygiene, mask and sheet. Skin prep: Chlorhexidine; local anesthetic administered A antimicrobial bonded/coated triple lumen catheter was placed in the right femoral vein due to emergent situation using the Seldinger technique.  Evaluation Blood flow good Complications: No apparent complications Patient did tolerate procedure well.   Debbora Ang A 04/15/2014, 8:09 PM

## 2014-04-15 NOTE — Anesthesia Procedure Notes (Deleted)
Procedure Name: Intubation Date/Time: 04/15/2014 7:35 PM Performed by: Franco NonesYATES, Lisel Siegrist S Pre-anesthesia Checklist: Patient identified, Emergency Drugs available, Suction available, Patient being monitored and Timeout performed Patient Re-evaluated:Patient Re-evaluated prior to inductionOxygen Delivery Method: Ambu bag Preoxygenation: Pre-oxygenation with 100% oxygen Intubation Type: IV induction Laryngoscope Size: Glidescope and 4 Grade View: Grade II Tube type: Subglottic suction tube Tube size: 7.0 mm Number of attempts: 5 or more Airway Equipment and Method: Video-laryngoscopy,  Oral airway and Rigid stylet Placement Confirmation: ETT inserted through vocal cords under direct vision,  CO2 detector and breath sounds checked- equal and bilateral Secured at: 24 cm Tube secured with: Tape Dental Injury: Teeth and Oropharynx as per pre-operative assessment  Comments: Deep and downward cricoid pressure

## 2014-04-15 NOTE — ED Provider Notes (Signed)
Asked to evaluate the patient in the ICU for intubation. Patient having increased difficulty breathing secondary to angioedema, requiring intubation to secure airway.   Emergent  condition was identified, no consent obtained.  Procedure: Intubation Permit was implied secondary to emergent situation. A number 4 GlideScope blade was inserted into the oropharynx at which time the vocal cords were visualized. A 7.5-French endotracheal tube was inserted and visualized going through the vocal cords. The stylette was removed. Colorimetric change was visualized on the CO2 meter. Breath sounds were heard in both lung fields equally. The endotracheal tube was placed at 24 cm, measured at the teeth.   Gilda Creasehristopher J Jamina Macbeth, MD 04/15/14 2006

## 2014-04-15 NOTE — Progress Notes (Signed)
Patient has not voided this am and is c/o of some pressure in lower abdomen. Bladder scanned after receiving lasix 40mg  IV, bladder scanned revealed 135ml of urine in bladder. MD made aware, orders given for foley to be inserted for acute urinary retention and aggressive IV diuresis. Will continue to monitor.

## 2014-04-15 NOTE — Progress Notes (Addendum)
CBG 76 after consuming 50% of breakfast. Will continue to monitor. MD Made aware.

## 2014-04-15 NOTE — Progress Notes (Signed)
/  Upon entering the patients room, he was sitting on the bedside and was stating he needed to go use the bathroom. When helping patient stand up to ambulate, he became very unsteady and had a near syncopal episode. Additional nursing staff entered the room to help assist the patient back to bed. BP 95/78, HR 70s. Assisted patient to wheelchair. When standing to return to back to bed, BP 58/22, HR 70's. Pt became very dizzy again when transferring back to bed. EKG was obtained. CBG checked 96. MD made aware. Will continue to monitor.

## 2014-04-15 NOTE — Progress Notes (Signed)
Pt yelling out, restless and agitated, attempting to get out of the bed. Unable to tell nursing staff what is wrong. MD paged to make aware.

## 2014-04-15 NOTE — Progress Notes (Addendum)
[facility] PROGRESS NOTE  FILEMON BRETON AVW:[id number] DOB: 1955-07-02 DOA: 04/13/2014 PCP: Pcp Not In System  Assessment/Plan: 1. Acutely decompensated combined systolic and diastolic CHF exacerbation 1. Elevated BNP with enlarged heart on CXR 2. Cardiology was consulted and is following 3. 2d echo done 3/19. Findings of E 20-25% with grade 3 diastolic dysfunction, diffuse hypokinesis 4. Currently on IV lasix TID 5. I/O is not accurate over past 24hrs as urinal was being emptied without measuring 6. Would ensure strict I/O 7. Monitor I/o and daily weights 8. TSH normal. Consideration for ETOH cardiomyopathy given significant reported ETOH abuse by pt's wife 9. Will attempt to transition from cardizem to beta blocker given EF 10. Will keep NPO after midnight in case cardiac intervention is recommended in AM 2. DM2 1. Mildly hypoglycemic this AM 2. Will reduce lantus to 40 units daily from 60 units 3. On SSI coverage 3. Angioedema 1. Suspect related to lisinopril, now on hold 2. Started steroids and benadryl 3. Improving 4. Tachycardia 1. Cardiology consulted per above 2. Started cardizem gtt 3. Rate better controlled 4. Given echo results, consider transitioning to beta blocker for rate control 5. DVT prophylaxis 1. Heparin subQ 6. ETOH abuse 1. Pt downplays his ETOH intake, but it is reportedly significant 2. Wife at bedside reports as much as 3x "40's" of beer in one day 3. CIWA is continued 4. Pt with evidence of cirrhosis per below 7. Cirrhosis, likely alcoholic 1. Evidence of cirrhosis on liver US 2. LFT's trending down 3. Avoid hepatotoxic agents  Code Status: Full Family Communication: Pt in room, family at bedside Disposition Plan: Pending   Consultants:  Cardiology  Procedures:    Antibiotics:   (indicate start date, and stop date if known)  HPI/Subjective: Wants to go home  Objective: Filed Vitals:   04/15/14 0830 04/15/14 0845 04/15/14  0900 04/15/14 0915  BP: 118/80 125/95 124/81 120/86  Pulse: 95 106 55 39  Temp:      TempSrc:      Resp: Height:      Weight:      SpO2: 95% 93% 96% 95%    Intake/Output Summary (Last 24 hours) at 04/15/14 0939 Last data filed at 04/14/14 1900  Gross per 24 hour  Intake    414 ml  Output      0 ml  Net    414 ml   Filed Weights   04/13/14 1548 04/14/14 0500 04/15/14 0500  Weight: 136.3 kg (300 lb 7.8 oz) 136.5 kg (300 lb 14.9 oz) 136.6 kg (301 lb 2.4 oz)    Exam:   General:  Awake, in nad  Cardiovascular: regular, s1, s2  Respiratory: normal resp effort, no wheezing  Abdomen: soft, obese, nondistended  Musculoskeletal: perfused, no clubbing   Data Reviewed: Basic Metabolic Panel:  Recent Labs Lab 04/13/14 1110 04/14/14 0427 04/15/14 0434  NA 135 134* 133*  K 3.7 4.4 4.0  CL 99 99 99  CO2 GLUCOSE 95 223* 79  BUN 29* 37* 47*  CREATININE 1.98* 2.33* 3.07*  CALCIUM 9.2 9.1 9.6  MG  --   --  1.8   Liver Function Tests:  Recent Labs Lab 04/13/14 1545 04/14/14 0427 04/15/14 0434  AST 61* 52* 43*  ALT ALKPHOS 97 95 91  BILITOT 2.1* 1.8* 1.4*  PROT 7.2 6.9 7.2  ALBUMIN 3.7 3.4* 3.7   No results for input(s): LIPASE, AMYLASE in  the last 168 hours. No results for input(s): AMMONIA in the last 168 hours. CBC:  Recent Labs Lab 04/13/14 1110 04/14/14 0427 04/15/14 0434  WBC 6.7 4.9 12.1*  NEUTROABS 4.3  --   --   HGB 13.2 13.1 13.4  HCT 38.9* 38.6* 40.1  MCV 94.0 94.4 94.4  PLT 151 150 187   Cardiac Enzymes:  Recent Labs Lab 04/13/14 1110 04/13/14 1545 04/13/14 2224 04/14/14 0427  TROPONINI 0.09* 0.08* 0.05* 0.04*   BNP (last 3 results)  Recent Labs  04/13/14 1110  BNP 617.0*    ProBNP (last 3 results) No results for input(s): PROBNP in the last 8760 hours.  CBG:  Recent Labs Lab 04/14/14 0733 04/14/14 1134 04/14/14 1743 04/14/14 2114 04/15/14 0801  GLUCAP 188* 216* 159* 108* 69*     Recent Results (from the past 240 hour(s))  MRSA PCR Screening     Status: Abnormal   Collection Time: 04/13/14  4:00 PM  Result Value Ref Range Status   MRSA by PCR POSITIVE (A) NEGATIVE Final    Comment:        The GeneXpert MRSA Assay (FDA approved for NASAL specimens only), is one component of a comprehensive MRSA colonization surveillance program. It is not intended to diagnose MRSA infection nor to guide or monitor treatment for MRSA infections. RESULT CALLED TO, READ BACK BY AND VERIFIED WITH:  CUMMINGS,R @ 2205 ON 04/13/14 BY WOODIE,J      Studies: Dg Chest 2 View  04/13/2014   CLINICAL DATA:  Facial and bilateral leg swelling for 2 weeks.  EXAM: CHEST  2 VIEW  COMPARISON:  01/11/2011  FINDINGS: Mild cardiomegaly, which has developed since previous. Aortic and hilar contours are stable.  No edema or convincing effusion. There is chronic coarsening of lung markings, especially at the right base. No pneumonia or pneumothorax.  IMPRESSION: 1. Cardiomegaly which has developed since 2012. 2. No edema or pneumonia.   Electronically Signed   By: Marnee Spring M.D.   On: 04/13/2014 12:18   US Abdomen Limited Ruq  04/14/2014   CLINICAL DATA:  Ethanol abuse  EXAM: US ABDOMEN LIMITED - RIGHT UPPER QUADRANT  COMPARISON:  None.  FINDINGS: Gallbladder:  No gallstones or wall thickening visualized. No sonographic Murphy sign noted.  Common bile duct:  Diameter: Normal diameter at 4 mm.  Liver:  There is uniform increase in liver echogenicity. No biliary duct dilatation. Fine nodular contour to the liver. No ascites.  IMPRESSION: 1. Normal gallbladder. 2. Echogenic liver with fine nodular contour suggests mild cirrhosis. 3. No ascites.   Electronically Signed   By: Genevive Bi M.D.   On: 04/14/2014 13:18    Scheduled Meds: . Chlorhexidine Gluconate Cloth  6 each Topical Q0600  . folic acid  1 mg Oral Daily  . furosemide  40 mg Intravenous TID  . heparin  5,000 Units Subcutaneous 3  times per day  . insulin aspart  0-15 Units Subcutaneous TID WC  . insulin aspart  0-5 Units Subcutaneous QHS  . insulin detemir  40 Units Subcutaneous Daily  . LORazepam  0-4 mg Intravenous Q6H   Followed by  . LORazepam  0-4 mg Intravenous Q12H  . metoprolol tartrate  50 mg Oral BID  . multivitamin with minerals  1 tablet Oral Daily  . mupirocin ointment  1 application Nasal BID  . predniSONE  20 mg Oral Q breakfast  . sodium chloride  3 mL Intravenous Q12H  . thiamine  100 mg  Oral Daily   Or  . thiamine  100 mg Intravenous Daily   Continuous Infusions: . diltiazem (CARDIZEM) infusion 7.5 mg/hr (04/14/14 1900)    Principal Problem:   CHF exacerbation Active Problems:   Diabetes type 2, controlled   Angioedema   Tachycardia    CHIU, STEPHEN K  [facility] Pager [phone]. If 7PM-7AM, please contact night-coverage at [email or web address], password [email or web address] 04/15/2014, 9:39 AM  LOS: 2 days

## 2014-04-15 NOTE — Progress Notes (Signed)
While attempting to insert foley using aseptic technique, patient bared down during insertion and push foley out while expelling urine all over the bed. 150 cc of urine was collected in the urinal after the foley came out. Patient was then cleaned up, peri care performed,  then foley catheter insertion was reattempted again using aseptic technique with 2 RNs at the bedside.

## 2014-04-15 NOTE — Progress Notes (Signed)
MD notified of lactic acid of 4.5 via text page/phone call

## 2014-04-16 ENCOUNTER — Inpatient Hospital Stay (HOSPITAL_COMMUNITY): Payer: Medicare Other

## 2014-04-16 DIAGNOSIS — I5040 Unspecified combined systolic (congestive) and diastolic (congestive) heart failure: Secondary | ICD-10-CM

## 2014-04-16 DIAGNOSIS — J9601 Acute respiratory failure with hypoxia: Secondary | ICD-10-CM

## 2014-04-16 LAB — COMPREHENSIVE METABOLIC PANEL
ALT: 54 U/L — ABNORMAL HIGH (ref 0–53)
ANION GAP: 12 (ref 5–15)
AST: 97 U/L — ABNORMAL HIGH (ref 0–37)
Albumin: 3.1 g/dL — ABNORMAL LOW (ref 3.5–5.2)
Alkaline Phosphatase: 81 U/L (ref 39–117)
BUN: 65 mg/dL — ABNORMAL HIGH (ref 6–23)
CO2: 22 mmol/L (ref 19–32)
CREATININE: 4.26 mg/dL — AB (ref 0.50–1.35)
Calcium: 8.9 mg/dL (ref 8.4–10.5)
Chloride: 99 mmol/L (ref 96–112)
GFR calc Af Amer: 16 mL/min — ABNORMAL LOW (ref >90)
GFR, EST NON AFRICAN AMERICAN: 14 mL/min — AB (ref >90)
Glucose, Bld: 154 mg/dL — ABNORMAL HIGH (ref 70–99)
Potassium: 5 mmol/L (ref 3.5–5.1)
Sodium: 133 mmol/L — ABNORMAL LOW (ref 135–145)
Total Bilirubin: 1.8 mg/dL — ABNORMAL HIGH (ref 0.3–1.2)
Total Protein: 6.4 g/dL (ref 6.0–8.3)

## 2014-04-16 LAB — URINALYSIS, ROUTINE W REFLEX MICROSCOPIC
Bilirubin Urine: NEGATIVE
Glucose, UA: NEGATIVE mg/dL
Ketones, ur: NEGATIVE mg/dL
Nitrite: NEGATIVE
Protein, ur: NEGATIVE mg/dL
Specific Gravity, Urine: 1.02 (ref 1.005–1.030)
UROBILINOGEN UA: 0.2 mg/dL (ref 0.0–1.0)
pH: 5 (ref 5.0–8.0)

## 2014-04-16 LAB — URINE MICROSCOPIC-ADD ON

## 2014-04-16 LAB — GLUCOSE, CAPILLARY
GLUCOSE-CAPILLARY: 117 mg/dL — AB (ref 70–99)
GLUCOSE-CAPILLARY: 109 mg/dL — AB (ref 70–99)
GLUCOSE-CAPILLARY: 123 mg/dL — AB (ref 70–99)
Glucose-Capillary: 155 mg/dL — ABNORMAL HIGH (ref 70–99)

## 2014-04-16 LAB — SODIUM, URINE, RANDOM: Sodium, Ur: 54 mmol/L

## 2014-04-16 LAB — CBC WITH DIFFERENTIAL/PLATELET
BASOS ABS: 0 10*3/uL (ref 0.0–0.1)
BASOS PCT: 0 % (ref 0–1)
Eosinophils Absolute: 0 10*3/uL (ref 0.0–0.7)
Eosinophils Relative: 0 % (ref 0–5)
LYMPHS PCT: 13 % (ref 12–46)
Lymphs Abs: 1 10*3/uL (ref 0.7–4.0)
MONO ABS: 0.5 10*3/uL (ref 0.1–1.0)
MONOS PCT: 6 % (ref 3–12)
NEUTROS PCT: 81 % — AB (ref 43–77)
Neutro Abs: 6.4 10*3/uL (ref 1.7–7.7)

## 2014-04-16 LAB — LACTIC ACID, PLASMA
LACTIC ACID, VENOUS: 2.2 mmol/L — AB (ref 0.5–2.0)
LACTIC ACID, VENOUS: 1.6 mmol/L (ref 0.5–2.0)
Lactic Acid, Venous: 3.2 mmol/L (ref 0.5–2.0)

## 2014-04-16 LAB — MAGNESIUM: Magnesium: 1.7 mg/dL (ref 1.5–2.5)

## 2014-04-16 LAB — CBC
HCT: 41.6 % (ref 39.0–52.0)
Hemoglobin: 13.8 g/dL (ref 13.0–17.0)
MCH: 31.6 pg (ref 26.0–34.0)
MCHC: 33.2 g/dL (ref 30.0–36.0)
MCV: 95.2 fL (ref 78.0–100.0)
PLATELETS: 157 10*3/uL (ref 150–400)
RBC: 4.37 MIL/uL (ref 4.22–5.81)
RDW: 13.6 % (ref 11.5–15.5)
WBC: 8.1 10*3/uL (ref 4.0–10.5)

## 2014-04-16 LAB — HEPATITIS PANEL, ACUTE
HCV AB: NEGATIVE
HEP A IGM: NONREACTIVE
HEP B C IGM: NONREACTIVE
HEP B S AG: NEGATIVE

## 2014-04-16 LAB — PROTIME-INR
INR: 1.59 — ABNORMAL HIGH (ref 0.00–1.49)
PROTHROMBIN TIME: 19.1 s — AB (ref 11.6–15.2)

## 2014-04-16 LAB — APTT: aPTT: 31 seconds (ref 24–37)

## 2014-04-16 LAB — ANTITHROMBIN III: ANTITHROMB III FUNC: 86 % (ref 75–120)

## 2014-04-16 LAB — CREATININE, URINE, RANDOM: CREATININE, URINE: 66.89 mg/dL

## 2014-04-16 LAB — HEPARIN LEVEL (UNFRACTIONATED): Heparin Unfractionated: 0.93 IU/mL — ABNORMAL HIGH (ref 0.30–0.70)

## 2014-04-16 LAB — PROCALCITONIN: Procalcitonin: 1.32 ng/mL

## 2014-04-16 MED ORDER — MAGNESIUM SULFATE 2 GM/50ML IV SOLN
2.0000 g | Freq: Once | INTRAVENOUS | Status: AC
  Administered 2014-04-16: 2 g via INTRAVENOUS
  Filled 2014-04-16: qty 50

## 2014-04-16 MED ORDER — CHLORHEXIDINE GLUCONATE 0.12 % MT SOLN
15.0000 mL | Freq: Two times a day (BID) | OROMUCOSAL | Status: DC
  Administered 2014-04-16 – 2014-04-23 (×17): 15 mL via OROMUCOSAL
  Filled 2014-04-16 (×14): qty 15

## 2014-04-16 MED ORDER — DEXMEDETOMIDINE HCL IN NACL 200 MCG/50ML IV SOLN
0.0000 ug/kg/h | INTRAVENOUS | Status: DC
  Administered 2014-04-16: 0.6 ug/kg/h via INTRAVENOUS
  Administered 2014-04-16: 0.5 ug/kg/h via INTRAVENOUS
  Administered 2014-04-17 (×2): 0.6 ug/kg/h via INTRAVENOUS
  Administered 2014-04-17 (×2): 0.7 ug/kg/h via INTRAVENOUS
  Administered 2014-04-17 (×2): 0.6 ug/kg/h via INTRAVENOUS
  Administered 2014-04-17: 0.8 ug/kg/h via INTRAVENOUS
  Administered 2014-04-17 – 2014-04-18 (×2): 0.6 ug/kg/h via INTRAVENOUS
  Administered 2014-04-18: 0.7 ug/kg/h via INTRAVENOUS
  Administered 2014-04-18 (×2): 0.6 ug/kg/h via INTRAVENOUS
  Administered 2014-04-18: 0.7 ug/kg/h via INTRAVENOUS
  Administered 2014-04-18 (×3): 0.6 ug/kg/h via INTRAVENOUS
  Administered 2014-04-18: 0.9 ug/kg/h via INTRAVENOUS
  Administered 2014-04-19: 1 ug/kg/h via INTRAVENOUS
  Administered 2014-04-19: 1.2 ug/kg/h via INTRAVENOUS
  Administered 2014-04-19 (×2): 1.1 ug/kg/h via INTRAVENOUS
  Administered 2014-04-19: 1.2 ug/kg/h via INTRAVENOUS
  Administered 2014-04-19: 1.1 ug/kg/h via INTRAVENOUS
  Filled 2014-04-16 (×12): qty 50
  Filled 2014-04-16: qty 100
  Filled 2014-04-16 (×2): qty 50
  Filled 2014-04-16: qty 100

## 2014-04-16 MED ORDER — ALBUTEROL SULFATE (2.5 MG/3ML) 0.083% IN NEBU: 2.5000 mg | INHALATION_SOLUTION | RESPIRATORY_TRACT | Status: DC | PRN

## 2014-04-16 MED ORDER — CETYLPYRIDINIUM CHLORIDE 0.05 % MT LIQD
7.0000 mL | Freq: Four times a day (QID) | OROMUCOSAL | Status: DC
  Administered 2014-04-16 – 2014-04-24 (×32): 7 mL via OROMUCOSAL

## 2014-04-16 MED ORDER — INSULIN DETEMIR 100 UNIT/ML ~~LOC~~ SOLN
15.0000 [IU] | Freq: Every day | SUBCUTANEOUS | Status: DC
  Administered 2014-04-17: 15 [IU] via SUBCUTANEOUS
  Filled 2014-04-16 (×2): qty 0.15

## 2014-04-16 MED ORDER — HEPARIN (PORCINE) IN NACL 100-0.45 UNIT/ML-% IJ SOLN
1000.0000 [IU]/h | INTRAMUSCULAR | Status: DC
  Administered 2014-04-16: 1800 [IU]/h via INTRAVENOUS
  Administered 2014-04-17: 1600 [IU]/h via INTRAVENOUS
  Filled 2014-04-16 (×2): qty 250

## 2014-04-16 MED ORDER — PIPERACILLIN-TAZOBACTAM 3.375 G IVPB
3.3750 g | Freq: Three times a day (TID) | INTRAVENOUS | Status: DC
  Administered 2014-04-16 – 2014-04-19 (×10): 3.375 g via INTRAVENOUS
  Filled 2014-04-16 (×13): qty 50

## 2014-04-16 MED ORDER — ASPIRIN 81 MG PO CHEW
81.0000 mg | CHEWABLE_TABLET | Freq: Every day | ORAL | Status: DC
  Administered 2014-04-16 – 2014-04-26 (×11): 81 mg
  Filled 2014-04-16 (×11): qty 1

## 2014-04-16 MED ORDER — ALTEPLASE 2 MG IJ SOLR
INTRAMUSCULAR | Status: AC
  Filled 2014-04-16: qty 2

## 2014-04-16 MED ORDER — FENTANYL CITRATE 0.05 MG/ML IJ SOLN
100.0000 ug | INTRAMUSCULAR | Status: DC | PRN
  Filled 2014-04-16: qty 2

## 2014-04-16 MED ORDER — INSULIN ASPART 100 UNIT/ML ~~LOC~~ SOLN
0.0000 [IU] | SUBCUTANEOUS | Status: DC
  Administered 2014-04-16: 3 [IU] via SUBCUTANEOUS
  Administered 2014-04-16: 2 [IU] via SUBCUTANEOUS
  Administered 2014-04-16: 3 [IU] via SUBCUTANEOUS
  Administered 2014-04-17: 5 [IU] via SUBCUTANEOUS
  Administered 2014-04-17: 3 [IU] via SUBCUTANEOUS
  Administered 2014-04-17: 2 [IU] via SUBCUTANEOUS
  Administered 2014-04-18: 15 [IU] via SUBCUTANEOUS
  Administered 2014-04-18: 11 [IU] via SUBCUTANEOUS
  Administered 2014-04-18: 5 [IU] via SUBCUTANEOUS
  Administered 2014-04-18: 15 [IU] via SUBCUTANEOUS
  Administered 2014-04-18 – 2014-04-19 (×2): 8 [IU] via SUBCUTANEOUS
  Administered 2014-04-19 (×3): 11 [IU] via SUBCUTANEOUS
  Administered 2014-04-19 (×2): 8 [IU] via SUBCUTANEOUS
  Administered 2014-04-20: 5 [IU] via SUBCUTANEOUS
  Administered 2014-04-20: 3 [IU] via SUBCUTANEOUS
  Administered 2014-04-20 (×3): 8 [IU] via SUBCUTANEOUS
  Administered 2014-04-21: 11 [IU] via SUBCUTANEOUS
  Administered 2014-04-21: 8 [IU] via SUBCUTANEOUS
  Administered 2014-04-21 (×3): 11 [IU] via SUBCUTANEOUS
  Administered 2014-04-21: 15 [IU] via SUBCUTANEOUS
  Administered 2014-04-22 (×2): 11 [IU] via SUBCUTANEOUS
  Administered 2014-04-22: 8 [IU] via SUBCUTANEOUS
  Administered 2014-04-22: 11 [IU] via SUBCUTANEOUS
  Administered 2014-04-22: 5 [IU] via SUBCUTANEOUS
  Administered 2014-04-22: 8 [IU] via SUBCUTANEOUS
  Administered 2014-04-23: 15 [IU] via SUBCUTANEOUS
  Administered 2014-04-23: 8 [IU] via SUBCUTANEOUS
  Administered 2014-04-23 (×2): 11 [IU] via SUBCUTANEOUS
  Administered 2014-04-23: 8 [IU] via SUBCUTANEOUS
  Administered 2014-04-23: 5 [IU] via SUBCUTANEOUS
  Administered 2014-04-24 (×2): 2 [IU] via SUBCUTANEOUS

## 2014-04-16 MED ORDER — ADULT MULTIVITAMIN W/MINERALS CH
1.0000 | ORAL_TABLET | Freq: Every day | ORAL | Status: DC
  Administered 2014-04-16 – 2014-04-30 (×15): 1
  Filled 2014-04-16 (×15): qty 1

## 2014-04-16 MED ORDER — STERILE WATER FOR INJECTION IJ SOLN
INTRAMUSCULAR | Status: AC
  Filled 2014-04-16: qty 10

## 2014-04-16 MED ORDER — DEXMEDETOMIDINE HCL IN NACL 200 MCG/50ML IV SOLN
INTRAVENOUS | Status: AC
  Filled 2014-04-16: qty 200

## 2014-04-16 MED ORDER — FUROSEMIDE 10 MG/ML IJ SOLN
4.0000 mg/h | INTRAVENOUS | Status: DC
  Administered 2014-04-16 – 2014-04-18 (×3): 8 mg/h via INTRAVENOUS
  Administered 2014-04-19: 4 mg/h via INTRAVENOUS
  Filled 2014-04-16 (×4): qty 25

## 2014-04-16 MED ORDER — HEPARIN BOLUS VIA INFUSION
4000.0000 [IU] | Freq: Once | INTRAVENOUS | Status: AC
  Administered 2014-04-16: 4000 [IU] via INTRAVENOUS
  Filled 2014-04-16: qty 4000

## 2014-04-16 MED ORDER — FOLIC ACID 1 MG PO TABS
1.0000 mg | ORAL_TABLET | Freq: Every day | ORAL | Status: DC
  Administered 2014-04-16 – 2014-04-30 (×15): 1 mg
  Filled 2014-04-16 (×15): qty 1

## 2014-04-16 MED ORDER — FUROSEMIDE 10 MG/ML IJ SOLN
80.0000 mg | Freq: Once | INTRAMUSCULAR | Status: AC
  Administered 2014-04-16: 80 mg via INTRAVENOUS
  Filled 2014-04-16: qty 8

## 2014-04-16 MED ORDER — DOBUTAMINE IN D5W 4-5 MG/ML-% IV SOLN
2.5000 ug/kg/min | INTRAVENOUS | Status: DC
  Administered 2014-04-16: 5 ug/kg/min via INTRAVENOUS
  Administered 2014-04-17 – 2014-04-19 (×2): 2.5 ug/kg/min via INTRAVENOUS
  Filled 2014-04-16 (×3): qty 250

## 2014-04-16 MED ORDER — FENTANYL CITRATE 0.05 MG/ML IJ SOLN
100.0000 ug | INTRAMUSCULAR | Status: DC | PRN
  Administered 2014-04-16 – 2014-04-24 (×19): 100 ug via INTRAVENOUS
  Filled 2014-04-16 (×21): qty 2

## 2014-04-16 NOTE — Consult Note (Addendum)
Reason for Consult: Acute kidney injury Referring Physician: Dr. Artis Delay is an 59 y.o. male.  HPI: The patient was history of hypertension, diabetes, history of alcohol abuse presently came with complaints of increase neck swelling, abdominal and some fascial swelling. When he was evaluated possibility of angioedema and congestive heart failure entertained and patient was admitted to the hospital. Presently patient is intubated and is unable to get any additional information. Presently patient is oliguric and with worsening of renal failure. At this moment there is no previous documented history of renal failure.I have reviewed the patient'Reynolds current medications.  Past Medical History  Diagnosis Date  . Hypertension   . Diabetes mellitus     Past Surgical History  Procedure Laterality Date  . Hand surgery      Family History  Problem Relation Age of Onset  . Diabetes Mother     Social History:  reports that he has never smoked. He has never used smokeless tobacco. He reports that he drinks about 4.2 oz of alcohol per week. He reports that he does not use illicit drugs.  Allergies:  Allergies  Allergen Reactions  . Ace Inhibitors Swelling  . Other Hives    wool    Medications:   Results for orders placed or performed during the hospital encounter of 04/13/14 (from the past 48 hour(Reynolds))  Glucose, capillary     Status: Abnormal   Collection Time: 04/14/14  5:43 PM  Result Value Ref Range   Glucose-Capillary 159 (H) 70 - 99 mg/dL  Glucose, capillary     Status: Abnormal   Collection Time: 04/14/14  9:14 PM  Result Value Ref Range   Glucose-Capillary 108 (H) 70 - 99 mg/dL   Comment 1 Notify RN   Comprehensive metabolic panel     Status: Abnormal   Collection Time: 04/15/14  4:34 AM  Result Value Ref Range   Sodium 133 (L) 135 - 145 mmol/L   Potassium 4.0 3.5 - 5.1 mmol/L   Chloride 99 96 - 112 mmol/L   CO2 22 19 - 32 mmol/L   Glucose, Bld 79 70 - 99 mg/dL    BUN 47 (H) 6 - 23 mg/dL   Creatinine, Ser 5.42 (H) 0.50 - 1.35 mg/dL   Calcium 9.6 8.4 - 66.4 mg/dL   Total Protein 7.2 6.0 - 8.3 g/dL   Albumin 3.7 3.5 - 5.2 g/dL   AST 43 (H) 0 - 37 U/L   ALT 28 0 - 53 U/L   Alkaline Phosphatase 91 39 - 117 U/L   Total Bilirubin 1.4 (H) 0.3 - 1.2 mg/dL   GFR calc non Af Amer 20 (L) >90 mL/min   GFR calc Af Amer 24 (L) >90 mL/min    Comment: (NOTE) The eGFR has been calculated using the CKD EPI equation. This calculation has not been validated in all clinical situations. eGFR'Reynolds persistently <90 mL/min signify possible Chronic Kidney Disease.    Anion gap 12 5 - 15  CBC     Status: Abnormal   Collection Time: 04/15/14  4:34 AM  Result Value Ref Range   WBC 12.1 (H) 4.0 - 10.5 K/uL   RBC 4.25 4.22 - 5.81 MIL/uL   Hemoglobin 13.4 13.0 - 17.0 g/dL   HCT 05.9 95.4 - 28.2 %   MCV 94.4 78.0 - 100.0 fL   MCH 31.5 26.0 - 34.0 pg   MCHC 33.4 30.0 - 36.0 g/dL   RDW 21.4 02.0 - 22.0 %  Platelets 187 150 - 400 K/uL  Magnesium     Status: None   Collection Time: 04/15/14  4:34 AM  Result Value Ref Range   Magnesium 1.8 1.5 - 2.5 mg/dL  Glucose, capillary     Status: Abnormal   Collection Time: 04/15/14  8:01 AM  Result Value Ref Range   Glucose-Capillary 69 (L) 70 - 99 mg/dL   Comment 1 Notify RN    Comment 2 Document in Chart   Glucose, capillary     Status: None   Collection Time: 04/15/14  8:42 AM  Result Value Ref Range   Glucose-Capillary 76 70 - 99 mg/dL   Comment 1 Notify RN    Comment 2 Document in Chart   Hepatitis panel, acute     Status: None   Collection Time: 04/15/14  9:57 AM  Result Value Ref Range   Hepatitis B Surface Ag NEGATIVE NEGATIVE   HCV Ab NEGATIVE NEGATIVE   Hep A IgM NON REACTIVE NON REACTIVE    Comment: (NOTE) Effective December 11, 2013, Hepatitis Acute Panel (test code [id number]) will be revised to automatically reflex to the Hepatitis C Viral RNA, Quantitative, Real-Time PCR assay if the Hepatitis C  antibody screening result is Reactive. This action is being taken to ensure that the CDC/USPSTF recommended HCV diagnostic algorithm with the appropriate test reflex needed for accurate interpretation is followed.    Hep B C IgM NON REACTIVE NON REACTIVE    Comment: (NOTE) High levels of Hepatitis B Core IgM antibody are detectable during the acute stage of Hepatitis B. This antibody is used to differentiate current from past HBV infection. Performed at [facility]   Glucose, capillary     Status: None   Collection Time: 04/15/14 10:30 AM  Result Value Ref Range   Glucose-Capillary 93 70 - 99 mg/dL  Glucose, capillary     Status: None   Collection Time: 04/15/14 11:30 AM  Result Value Ref Range   Glucose-Capillary 97 70 - 99 mg/dL  Glucose, capillary     Status: Abnormal   Collection Time: 04/15/14  3:43 PM  Result Value Ref Range   Glucose-Capillary 117 (H) 70 - 99 mg/dL   Comment 1 Notify RN    Comment 2 Document in Chart   Blood gas, arterial     Status: Abnormal   Collection Time: 04/15/14  4:13 PM  Result Value Ref Range   O2 Content 3.0 L/min   Delivery systems NASAL CANNULA    pH, Arterial 7.507 (H) 7.350 - 7.450   pCO2 arterial 17.2 (LL) 35.0 - 45.0 mmHg    Comment: CRITICAL RESULT CALLED TO, READ BACK BY AND VERIFIED WITH: NEDINE ROWE, RN BY JESSICA LASLEY, RRT ON 04/15/2014 AT 1616    pO2, Arterial 65.0 (L) 80.0 - 100.0 mmHg   Bicarbonate 13.5 (L) 20.0 - 24.0 mEq/L   TCO2 11.7 0 - 100 mmol/L   Acid-base deficit 9.1 (H) 0.0 - 2.0 mmol/L   O2 Saturation 92.6 %   Patient temperature 37.0    Collection site RIGHT BRACHIAL    Drawn by COLLECTED BY RT    Sample type ARTERIAL    Allens test (pass/fail) NOT INDICATED (A) PASS  Lactic acid, plasma     Status: Abnormal   Collection Time: 04/15/14  8:00 PM  Result Value Ref Range   Lactic Acid, Venous 4.5 (HH) 0.5 - 2.0 mmol/L    Comment: MODERATE HEMOLYSIS CRITICAL RESULT CALLED TO, READ BACK BY AND  VERIFIED WITH: WAGONER,R AT 2055 ON 04/15/14 BY MOSLEY,   Lipase, blood     Status: None   Collection Time: 04/15/14  8:00 PM  Result Value Ref Range   Lipase 25 11 - 59 U/L  Glucose, capillary     Status: Abnormal   Collection Time: 04/15/14  9:19 PM  Result Value Ref Range   Glucose-Capillary 109 (H) 70 - 99 mg/dL   Comment 1 Notify RN   Blood gas, arterial     Status: Abnormal   Collection Time: 04/15/14 10:23 PM  Result Value Ref Range   FIO2 100.00 %   Delivery systems VENTILATOR    Mode PRESSURE REGULATED VOLUME CONTROL    VT 700 mL   Rate 12 resp/min   Peep/cpap 5.0 cm H20   pH, Arterial 7.348 (L) 7.350 - 7.450   pCO2 arterial 31.8 (L) 35.0 - 45.0 mmHg   pO2, Arterial 134.0 (H) 80.0 - 100.0 mmHg   Bicarbonate 17.0 (L) 20.0 - 24.0 mEq/L   TCO2 15.1 0 - 100 mmol/L   Acid-base deficit 7.5 (H) 0.0 - 2.0 mmol/L   O2 Saturation 98.0 %   Patient temperature 37.0    Collection site RIGHT RADIAL    Drawn by [id number]    Sample type ARTERIAL    Allens test (pass/fail) PASS PASS  Glucose, capillary     Status: Abnormal   Collection Time: 04/16/14  7:40 AM  Result Value Ref Range   Glucose-Capillary 123 (H) 70 - 99 mg/dL   Comment 1 Notify RN    Comment 2 Document in Chart   Comprehensive metabolic panel     Status: Abnormal   Collection Time: 04/16/14  9:00 AM  Result Value Ref Range   Sodium 133 (L) 135 - 145 mmol/L   Potassium 5.0 3.5 - 5.1 mmol/L    Comment: DELTA CHECK NOTED   Chloride 99 96 - 112 mmol/L   CO2 22 19 - 32 mmol/L   Glucose, Bld 154 (H) 70 - 99 mg/dL   BUN 65 (H) 6 - 23 mg/dL   Creatinine, Ser 4.26 (H) 0.50 - 1.35 mg/dL   Calcium 8.9 8.4 - 10.5 mg/dL   Total Protein 6.4 6.0 - 8.3 g/dL   Albumin 3.1 (L) 3.5 - 5.2 g/dL   AST 97 (H) 0 - 37 U/L   ALT 54 (H) 0 - 53 U/L   Alkaline Phosphatase 81 39 - 117 U/L   Total Bilirubin 1.8 (H) 0.3 - 1.2 mg/dL   GFR calc non Af Amer 14 (L) >90 mL/min   GFR calc Af Amer 16 (L) >90 mL/min    Comment: (NOTE) The eGFR  has been calculated using the CKD EPI equation. This calculation has not been validated in all clinical situations. eGFR'Reynolds persistently <90 mL/min signify possible Chronic Kidney Disease.    Anion gap 12 5 - 15  CBC     Status: None   Collection Time: 04/16/14  9:00 AM  Result Value Ref Range   WBC 8.1 4.0 - 10.5 K/uL   RBC 4.37 4.22 - 5.81 MIL/uL   Hemoglobin 13.8 13.0 - 17.0 g/dL   HCT 41.6 39.0 - 52.0 %   MCV 95.2 78.0 - 100.0 fL   MCH 31.6 26.0 - 34.0 pg   MCHC 33.2 30.0 - 36.0 g/dL   RDW 13.6 11.5 - 15.5 %   Platelets 157 150 - 400 K/uL  Magnesium     Status: None   Collection Time:  04/16/14  9:00 AM  Result Value Ref Range   Magnesium 1.7 1.5 - 2.5 mg/dL  Procalcitonin     Status: None   Collection Time: 04/16/14  9:00 AM  Result Value Ref Range   Procalcitonin 1.32 ng/mL    Comment:        Interpretation: PCT > 0.5 ng/mL and <= 2 ng/mL: Systemic infection (sepsis) is possible, but other conditions are known to elevate PCT as well. (NOTE)         ICU PCT Algorithm               Non ICU PCT Algorithm    ----------------------------     ------------------------------         PCT < 0.25 ng/mL                 PCT < 0.1 ng/mL     Stopping of antibiotics            Stopping of antibiotics       strongly encouraged.               strongly encouraged.    ----------------------------     ------------------------------       PCT level decrease by               PCT < 0.25 ng/mL       >= 80% from peak PCT       OR PCT 0.25 - 0.5 ng/mL          Stopping of antibiotics                                             encouraged.     Stopping of antibiotics           encouraged.    ----------------------------     ------------------------------       PCT level decrease by              PCT >= 0.25 ng/mL       < 80% from peak PCT        AND PCT >= 0.5 ng/mL             Continuing antibiotics                                              encouraged.       Continuing antibiotics             encouraged.    ----------------------------     ------------------------------     PCT level increase compared          PCT > 0.5 ng/mL         with peak PCT AND          PCT >= 0.5 ng/mL             Escalation of antibiotics                                          strongly encouraged.      Escalation of antibiotics        strongly encouraged.  Lactic acid, plasma     Status: Abnormal   Collection Time: 04/16/14  9:15 AM  Result Value Ref Range   Lactic Acid, Venous 3.2 (HH) 0.5 - 2.0 mmol/L    Comment: CRITICAL RESULT CALLED TO, READ BACK BY AND VERIFIED WITH: PHILLIPS,C AT 10:05AM ON 04/16/14 BY FESTERMAN,C   Culture, blood (x 2)     Status: None (Preliminary result)   Collection Time: 04/16/14 10:06 AM  Result Value Ref Range   Specimen Description BLOOD    Special Requests NONE    Culture NO GROWTH <24 HRS    Report Status PENDING   CBC with Differential     Status: Abnormal   Collection Time: 04/16/14 10:06 AM  Result Value Ref Range   WBC DUPLICATE 4.0 - 09.3 K/uL   RBC DUPLICATE 2.35 - 5.73 MIL/uL   Hemoglobin DUPLICATE 22.0 - 25.4 g/dL   HCT DUPLICATE 27.0 - 62.3 %   MCV DUPLICATE 76.2 - 831.5 fL   MCH DUPLICATE 17.6 - 16.0 pg   MCHC DUPLICATE 73.7 - 10.6 g/dL   RDW DUPLICATE 26.9 - 48.5 %   Platelets DUPLICATE 462 - 703 K/uL   Neutrophils Relative % 81 (H) 43 - 77 %   Neutro Abs 6.4 1.7 - 7.7 K/uL   Lymphocytes Relative 13 12 - 46 %   Lymphs Abs 1.0 0.7 - 4.0 K/uL   Monocytes Relative 6 3 - 12 %   Monocytes Absolute 0.5 0.1 - 1.0 K/uL   Eosinophils Relative 0 0 - 5 %   Eosinophils Absolute 0.0 0.0 - 0.7 K/uL   Basophils Relative 0 0 - 1 %   Basophils Absolute 0.0 0.0 - 0.1 K/uL  Protime-INR     Status: Abnormal   Collection Time: 04/16/14 10:06 AM  Result Value Ref Range   Prothrombin Time 19.1 (H) 11.6 - 15.2 seconds   INR 1.59 (H) 0.00 - 1.49  APTT     Status: None   Collection Time: 04/16/14 10:06 AM  Result Value Ref Range   aPTT 31 24 - 37  seconds  Culture, blood (x 2)     Status: None (Preliminary result)   Collection Time: 04/16/14 10:16 AM  Result Value Ref Range   Specimen Description BLOOD    Special Requests NONE    Culture NO GROWTH <24 HRS    Report Status PENDING   Glucose, capillary     Status: Abnormal   Collection Time: 04/16/14 12:12 PM  Result Value Ref Range   Glucose-Capillary 155 (H) 70 - 99 mg/dL   Comment 1 Notify RN    Comment 2 Document in Chart     Ct Abdomen Pelvis Wo Contrast  04/15/2014   CLINICAL DATA:  Initial evaluation for abdominal pain with concern for renal obstruction or small bowel obstruction as well as ascites, personal history of diabetes and hypertension  EXAM: CT ABDOMEN AND PELVIS WITHOUT CONTRAST  TECHNIQUE: Multidetector CT imaging of the abdomen and pelvis was performed following the standard protocol without IV contrast.  COMPARISON:  04/14/2014  FINDINGS: There is a small volume of ascites in the abdomen and pelvis. Liver is normal as is the gallbladder. The spleen is normal. Pancreas appears normal. There are peripancreatic lymph nodes in the region of the portal vein. There is suggestion of mild inflammatory change around the pancreas. There is also possibly mild inflammatory change inferior to the pancreas around the duodenum.  Adrenal glands are normal. Kidneys are normal. There are no  renal or ureteral calculi. There is no evidence of hydronephrosis. There is moderate bilateral perinephric inflammatory change.  Reproductive organs are normal. Bladder is contracted and mildly hyper attenuating. There is mild inflammatory change around the bladder.  There is mild calcification of the abdominal aorta. There is a nonobstructive bowel gas pattern. There are no abnormally dilated loops of small or large bowel. Appendix appears nondilated. Stomach is normal.  There are no acute musculoskeletal findings. There is coronary artery calcification. There is mild cardiac enlargement. There is trace  left pleural fluid. There is a small right pleural effusion. There is mildly increased attenuation in the body wall suggesting possibility of mild anasarca.  IMPRESSION: Mild ascites in the abdomen and pelvis. Additionally, there is possible inflammatory change in the region of the pancreas that could indicate pancreatitis. There is also inflammatory change in the perinephric space bilaterally as well as around the bladder. Cystitis with pyelonephritis not excluded.  Bladder is essentially entirely decompressed and appears somewhat hyper attenuating. This may reflect concentrated urine. Correlate for any evidence of hematuria however.  Small pleural effusions   Electronically Signed   By: Skipper Cliche M.D.   On: 04/15/2014 18:04   Dg Chest 1 View  04/15/2014   CLINICAL DATA:  Endotracheal tube placement.  Initial encounter.  EXAM: CHEST  1 VIEW  COMPARISON:  Chest radiograph performed earlier today at 4:17 p.m.  FINDINGS: The patient'Reynolds endotracheal tube is seen ending 4 cm above the carina.  The lungs are hypoexpanded. Vascular crowding and vascular congestion are seen. Mild left-sided opacity likely reflects atelectasis. No definite pleural effusion or pneumothorax is seen.  The cardiomediastinal silhouette is borderline enlarged. No acute osseous abnormalities are identified.  IMPRESSION: 1. Endotracheal tube seen ending 4 cm above the carina. 2. Lungs hypoexpanded. Vascular congestion and borderline cardiomegaly noted. Mild left-sided airspace opacity likely reflects atelectasis.   Electronically Signed   By: Garald Balding M.D.   On: 04/15/2014 20:19   Dg Chest Port 1 View  04/15/2014   CLINICAL DATA:  CHF  EXAM: PORTABLE CHEST - 1 VIEW  COMPARISON:  04/13/2014  FINDINGS: Lungs are clear. No frank interstitial edema. No pleural effusion or pneumothorax.  Cardiomegaly.  IMPRESSION: No evidence of acute cardiopulmonary disease.   Electronically Signed   By: Julian Hy M.D.   On: 04/15/2014 16:46     Review of Systems  Unable to perform ROS: intubated   Blood pressure 127/85, pulse 82, temperature 96 F (35.6 C), temperature source Axillary, resp. rate 15, height $RemoveBe'6\' 1"'[id number]$  (1.854 m), weight 138.7 kg (305 lb 12.5 oz), SpO2 100 %. Physical Exam  Constitutional: No distress.  HENT:  Mouth/Throat: No oropharyngeal exudate.  Eyes: Left eye exhibits no discharge.  Neck: No JVD present.  Cardiovascular: Normal rate and regular rhythm.   No murmur heard. Respiratory: No respiratory distress. He has no wheezes.  GI: He exhibits distension. There is no tenderness. There is no rebound.  Musculoskeletal: He exhibits edema.    Assessment/Plan: Problem #1 acute kidney injury: Presently oliguric. The etiology could be prerenal versus hepatorenal. [Patient with hyponatremia, anasarca, acute kidney injury with some hypotension]. At this moment cardiorenal and ATN cannot be ruled out. Patient is on dobutamine and this is started on Lasix drip Problem #2 severe cardiomyopathy: Presently being followed by cardiology. Etiology could be related to alcoholic  cardiomyopathy versus hypertensive cardiomyopathy. As stated above patient presently on dobutamine. Problem #3 anasarca: Possibly secondary to chronic liver disease versus proceeded cardiomyopathy  associated with salt and fluid intake. Problem #4 history of diabetes Problem #5 hypertension: His blood pressure is moment is fluctuating but low normal. Problem #6 history of angioedema: Taught to be secondary to ACE inhibitor which was discontinued. Problem #7 elevated LFTs Problem #8 tachycardia Plan: We'll check urine sodium,  creatinine. We will give him Lasix 80 mg IV now and continue with a drip. We'll check his basic metabolic panel in the morning.  Gabriel Reynolds 04/16/2014, 1:55 PM

## 2014-04-16 NOTE — Progress Notes (Addendum)
[facility] PROGRESS NOTE  EAIN MULLENDORE ZOX:[id number] DOB: 1955/11/04 DOA: 04/13/2014 PCP: Pcp Not In System  Assessment/Plan: 1. Acutely decompensated combined systolic and diastolic CHF exacerbation 1. Elevated BNP with enlarged heart on CXR 2. Cardiology was consulted and is following 3. 2d echo done 3/19. Findings of E 20-25% with grade 3 diastolic dysfunction, diffuse hypokinesis 4. No urine output despite IV lasix TID, now on lasix gtt per Cardiology 5. Monitor I/o and daily weights 6. TSH normal.  7. Likely ETOH cardiomyopathy given significant reported ETOH abuse by pt's wife 2. DM2 1. As pt is intubated, presently NPO, reduce lantus to 20 units for now 2. Have consulted Dietitian for tube feed recs 3. On SSI coverage 3. Angioedema 1. Suspect related to lisinopril, now on hold 2. ACEI listed as allergy 3. Continue steroids and benadryl 4. Improving 4. Tachycardia 1. Cardiology following 2. Rate better controlled currently 3. Ultimately would benefit from metoprolol succinate or carvedilol 5. ETOH abuse 1. Pt downplayed his ETOH intake, but it is reportedly significant 2. Wife at bedside reported as much as 3x "40's" of beer in one day 3. Pt with evidence of cirrhosis per below 4. On evening of 3/20, pt became unstable and unable to maintain airway, thus intubated with concerns of acute ETOH withdrawal 6. Cirrhosis, likely alcoholic 1. Evidence of cirrhosis on liver US 2. LFT's trending down 3. Avoid hepatotoxic agents 7. LE DVT 1. Noted on LE dopplers 2. Also concerns for possible PE as well, but unable to obtain VQ or CT w/ contrast 3. Have started therapeutic heparin gtt 8. Possible aspiration pneumonia with sepsis 1. Elevated lactic acid during this admit with hypotension, hypothermia of 96.67F 2. Pan cultures are thus far unremarkable 3. Given recent intubation, will cover for aspiration PNA empirically with zosyn 4. Follow lactate trends 5. Not giving  IVF secondary to pt being clinically volume overloaded  Code Status: Full Family Communication: Pt in room, family at bedside Disposition Plan: Pending   Consultants:  Cardiology  Procedures:    Antibiotics:   (indicate start date, and stop date if known)  HPI/Subjective: No acute events this AM. Remains intubated  Objective: Filed Vitals:   04/16/14 1215 04/16/14 1230 04/16/14 1247 04/16/14 1707  BP: 113/90 127/85    Pulse: 66 82    Temp:   96 F (35.6 C) 96.4 F (35.8 C)  TempSrc:   Axillary Axillary  Resp: 17 15    Height:      Weight:      SpO2: 99% 100%      Intake/Output Summary (Last 24 hours) at 04/16/14 1740 Last data filed at 04/16/14 0500  Gross per 24 hour  Intake 394.92 ml  Output    400 ml  Net  -5.08 ml   Filed Weights   04/14/14 0500 04/15/14 0500 04/16/14 0500  Weight: 136.5 kg (300 lb 14.9 oz) 136.6 kg (301 lb 2.4 oz) 138.7 kg (305 lb 12.5 oz)    Exam:   General:  Intubated, sedated  Cardiovascular: regular, s1, s2  Respiratory: Intubated, no wheezing  Abdomen: soft, obese, nondistended  Musculoskeletal: perfused, no clubbing   Data Reviewed: Basic Metabolic Panel:  Recent Labs Lab 04/13/14 1110 04/14/14 0427 04/15/14 0434 04/16/14 0900  NA 135 134* 133* 133*  K 3.7 4.4 4.0 5.0  CL 99 99 99 99  CO2 26 25 22 22   GLUCOSE 95 223* 79 154*  BUN 29* 37* 47* 65*  CREATININE 1.98* 2.33* 3.07* 4.26*  CALCIUM 9.2 9.1 9.6 8.9  MG  --   --  1.8 1.7   Liver Function Tests:  Recent Labs Lab 04/13/14 1545 04/14/14 0427 04/15/14 0434 04/16/14 0900  AST 61* 52* 43* 97*  ALT 30 30 28  54*  ALKPHOS 97 95 91 81  BILITOT 2.1* 1.8* 1.4* 1.8*  PROT 7.2 6.9 7.2 6.4  ALBUMIN 3.7 3.4* 3.7 3.1*    Recent Labs Lab 04/15/14 2000  LIPASE 25   No results for input(s): AMMONIA in the last 168 hours. CBC:  Recent Labs Lab 04/13/14 1110 04/14/14 0427 04/15/14 0434 04/16/14 0900 04/16/14 1006  WBC 6.7 4.9 12.1* 8.1  DUPLICATE  NEUTROABS 4.3  --   --   --  6.4  HGB 13.2 13.1 13.4 13.8 DUPLICATE  HCT 38.9* 38.6* 40.1 41.6 DUPLICATE  MCV 94.0 94.4 94.4 95.2 DUPLICATE  PLT 151 150 187 157 DUPLICATE   Cardiac Enzymes:  Recent Labs Lab 04/13/14 1110 04/13/14 1545 04/13/14 2224 04/14/14 0427  TROPONINI 0.09* 0.08* 0.05* 0.04*   BNP (last 3 results)  Recent Labs  04/13/14 1110  BNP 617.0*    ProBNP (last 3 results) No results for input(s): PROBNP in the last 8760 hours.  CBG:  Recent Labs Lab 04/15/14 1130 04/15/14 1543 04/15/14 2119 04/16/14 0740 04/16/14 1212  GLUCAP 97 117* 109* 123* 155*    Recent Results (from the past 240 hour(s))  MRSA PCR Screening     Status: Abnormal   Collection Time: 04/13/14  4:00 PM  Result Value Ref Range Status   MRSA by PCR POSITIVE (A) NEGATIVE Final    Comment:        The GeneXpert MRSA Assay (FDA approved for NASAL specimens only), is one component of a comprehensive MRSA colonization surveillance program. It is not intended to diagnose MRSA infection nor to guide or monitor treatment for MRSA infections. RESULT CALLED TO, READ BACK BY AND VERIFIED WITH:  CUMMINGS,R @ 2205 ON 04/13/14 BY WOODIE,J   Culture, blood (x 2)     Status: None (Preliminary result)   Collection Time: 04/16/14 10:06 AM  Result Value Ref Range Status   Specimen Description BLOOD  Final   Special Requests NONE  Final   Culture NO GROWTH <24 HRS  Final   Report Status PENDING  Incomplete  Culture, blood (x 2)     Status: None (Preliminary result)   Collection Time: 04/16/14 10:16 AM  Result Value Ref Range Status   Specimen Description BLOOD  Final   Special Requests NONE  Final   Culture NO GROWTH <24 HRS  Final   Report Status PENDING  Incomplete     Studies: Ct Abdomen Pelvis Wo Contrast  04/15/2014   CLINICAL DATA:  Initial evaluation for abdominal pain with concern for renal obstruction or small bowel obstruction as well as ascites, personal history  of diabetes and hypertension  EXAM: CT ABDOMEN AND PELVIS WITHOUT CONTRAST  TECHNIQUE: Multidetector CT imaging of the abdomen and pelvis was performed following the standard protocol without IV contrast.  COMPARISON:  04/14/2014  FINDINGS: There is a small volume of ascites in the abdomen and pelvis. Liver is normal as is the gallbladder. The spleen is normal. Pancreas appears normal. There are peripancreatic lymph nodes in the region of the portal vein. There is suggestion of mild inflammatory change around the pancreas. There is also possibly mild inflammatory change inferior to the pancreas around the duodenum.  Adrenal glands are normal. Kidneys are normal. There are  no renal or ureteral calculi. There is no evidence of hydronephrosis. There is moderate bilateral perinephric inflammatory change.  Reproductive organs are normal. Bladder is contracted and mildly hyper attenuating. There is mild inflammatory change around the bladder.  There is mild calcification of the abdominal aorta. There is a nonobstructive bowel gas pattern. There are no abnormally dilated loops of small or large bowel. Appendix appears nondilated. Stomach is normal.  There are no acute musculoskeletal findings. There is coronary artery calcification. There is mild cardiac enlargement. There is trace left pleural fluid. There is a small right pleural effusion. There is mildly increased attenuation in the body wall suggesting possibility of mild anasarca.  IMPRESSION: Mild ascites in the abdomen and pelvis. Additionally, there is possible inflammatory change in the region of the pancreas that could indicate pancreatitis. There is also inflammatory change in the perinephric space bilaterally as well as around the bladder. Cystitis with pyelonephritis not excluded.  Bladder is essentially entirely decompressed and appears somewhat hyper attenuating. This may reflect concentrated urine. Correlate for any evidence of hematuria however.  Small  pleural effusions   Electronically Signed   By: Esperanza Heiraymond  Rubner M.D.   On: 04/15/2014 18:04   Dg Chest 1 View  04/15/2014   CLINICAL DATA:  Endotracheal tube placement.  Initial encounter.  EXAM: CHEST  1 VIEW  COMPARISON:  Chest radiograph performed earlier today at 4:17 p.m.  FINDINGS: The patient's endotracheal tube is seen ending 4 cm above the carina.  The lungs are hypoexpanded. Vascular crowding and vascular congestion are seen. Mild left-sided opacity likely reflects atelectasis. No definite pleural effusion or pneumothorax is seen.  The cardiomediastinal silhouette is borderline enlarged. No acute osseous abnormalities are identified.  IMPRESSION: 1. Endotracheal tube seen ending 4 cm above the carina. 2. Lungs hypoexpanded. Vascular congestion and borderline cardiomegaly noted. Mild left-sided airspace opacity likely reflects atelectasis.   Electronically Signed   By: Roanna RaiderJeffery  Chang M.D.   On: 04/15/2014 20:19   [place] Venous Img Lower Bilateral  04/16/2014   CLINICAL DATA:  Bilateral leg and foot swelling for 2 weeks. Evaluate for DVT. The patient is currently on the ventilator.  EXAM: BILATERAL LOWER EXTREMITY VENOUS DOPPLER ULTRASOUND  TECHNIQUE: Gray-scale sonography with graded compression, as well as color Doppler and duplex ultrasound were performed to evaluate the lower extremity deep venous systems from the level of the common femoral vein and including the common femoral, femoral, profunda femoral, popliteal and calf veins including the posterior tibial, peroneal and gastrocnemius veins when visible. The superficial great saphenous vein was also interrogated. Spectral Doppler was utilized to evaluate flow at rest and with distal augmentation maneuvers in the common femoral, femoral and popliteal veins.  COMPARISON:  None.  FINDINGS: RIGHT LOWER EXTREMITY  Common Femoral Vein: No evidence of thrombus. Normal compressibility.  Saphenofemoral Junction: No evidence of thrombus. Normal  compressibility.  Profunda Femoral Vein: No evidence of thrombus. Normal compressibility.  Femoral Vein: No evidence of thrombus.  Normal compressibility.  Popliteal Vein: No evidence of thrombus.  Normal compressibility.  Calf Veins: There is hypoechoic expansile occlusive thrombus within the posterior tibial vein (representative images 38 through 42).  Superficial Great Saphenous Vein: No evidence of thrombus. Normal compressibility and flow on color Doppler imaging.  Venous Reflux:  None.  Other Findings: A minimal on subcutaneous edema is noted at the level of the calf and lower leg.  LEFT LOWER EXTREMITY  Common Femoral Vein: No evidence of thrombus. Normal compressibility, respiratory phasicity and response  to augmentation.  Saphenofemoral Junction: No evidence of thrombus. Normal compressibility.  Profunda Femoral Vein: No evidence of thrombus. Normal compressibility.  Femoral Vein: No evidence of thrombus.  Normal compressibility.  Popliteal Vein: No evidence of thrombus. Slow flow is demonstrated within the left popliteal vein (image 81) however the vessel remains normally compressible at this location (representative image 84).  Calf Veins: There is hypoechoic nonocclusive thrombus within 1 of the paired left posterior tibial veins (image 99 through 101).  Superficial Great Saphenous Vein: No evidence of thrombus. Normal compressibility and flow on color Doppler imaging.  Venous Reflux:  None.  Other Findings: There is age-indeterminate occlusive superficial thrombophlebitis involving the lesser saphenous vein (Images 105 through portal 7). A minimal amount of subcutaneous edema is noted at the level of the left calf and ankle.  IMPRESSION: 1. Examination positive for DVT involving the right posterior tibial vein and one of the paired left posterior tibial veins, however there is no extension of this distal DVT to involve the more proximal deep venous systems of either lower extremity. 2. Examination is  positive for age-indeterminate occlusive superficial thrombophlebitis involving the left lesser saphenous vein.   Electronically Signed   By: Simonne Come M.D.   On: 04/16/2014 14:12   Dg Chest Port 1 View  04/16/2014   CLINICAL DATA:  PICC placement  EXAM: PORTABLE CHEST - 1 VIEW  COMPARISON:  04/15/2014  FINDINGS: New left upper extremity PICC, tip in the region of the upper right atrium. An endotracheal tube tip remains at the clavicular heads. Gastric suction tube reaches the stomach.  Unchanged cardiopericardial enlargement. Increased pulmonary venous congestion. The retrocardiac lung remains largely obscured by soft tissue attenuation. No effusion or pneumothorax.  IMPRESSION: 1. New left upper extremity PICC, tip at the upper right atrium. 2. Cardiomegaly and increased pulmonary venous congestion.   Electronically Signed   By: Marnee Spring M.D.   On: 04/16/2014 16:46   Dg Chest Port 1 View  04/15/2014   CLINICAL DATA:  CHF  EXAM: PORTABLE CHEST - 1 VIEW  COMPARISON:  04/13/2014  FINDINGS: Lungs are clear. No frank interstitial edema. No pleural effusion or pneumothorax.  Cardiomegaly.  IMPRESSION: No evidence of acute cardiopulmonary disease.   Electronically Signed   By: Charline Bills M.D.   On: 04/15/2014 16:46    Scheduled Meds: . antiseptic oral rinse  7 mL Mouth Rinse QID  . [START ON 04/17/2014] aspirin  81 mg Per Tube Daily  . chlorhexidine  15 mL Mouth Rinse BID  . Chlorhexidine Gluconate Cloth  6 each Topical Q0600  . [START ON 04/17/2014] folic acid  1 mg Per Tube Daily  . insulin aspart  0-15 Units Subcutaneous 6 times per day  . [START ON 04/17/2014] insulin detemir  15 Units Subcutaneous Daily  . LORazepam  0-4 mg Intravenous Q12H  . methylPREDNISolone (SOLU-MEDROL) injection  40 mg Intravenous Q24H  . [START ON 04/17/2014] multivitamin with minerals  1 tablet Per Tube Daily  . mupirocin ointment  1 application Nasal BID  . piperacillin-tazobactam (ZOSYN)  IV  3.375 g  Intravenous Q8H  . sodium chloride  10-40 mL Intracatheter Q12H  . sodium chloride  3 mL Intravenous Q12H  . thiamine  100 mg Oral Daily   Or  . thiamine  100 mg Intravenous Daily   Continuous Infusions: . dexmedetomidine 0.6 mcg/kg/hr (04/16/14 1447)  . dexmedetomidine 0.5 mcg/kg/hr (04/16/14 1253)  . DOBUTamine 3 mcg/kg/min (04/16/14 1032)  . furosemide (LASIX) infusion  8 mg/hr (04/16/14 1252)  . heparin 1,800 Units/hr (04/16/14 1708)    Principal Problem:   CHF exacerbation Active Problems:   Diabetes type 2, controlled   Angioedema   Tachycardia    CHIU, STEPHEN K  [facility] Pager [phone]. If 7PM-7AM, please contact night-coverage at [email or web address], password [facility] 04/16/2014, 5:40 PM  LOS: 3 days

## 2014-04-16 NOTE — Progress Notes (Signed)
ANTIBIOTIC CONSULT NOTE - INITIAL  Pharmacy Consult for Zosyn Indication: possible aspiration pneumonia, sepsis  Allergies  Allergen Reactions  . Other Hives    wool   Patient Measurements: Height: 6\' 1"  (185.4 cm) Weight: (!) 305 lb 12.5 oz (138.7 kg) IBW/kg (Calculated) : 79.9  Vital Signs: Temp: 96 F (35.6 C) (03/21 1247) Temp Source: Axillary (03/21 1247) BP: 127/85 mmHg (03/21 1230) Pulse Rate: 82 (03/21 1230) Intake/Output from previous day: 03/20 0701 - 03/21 0700 In: 394.9 [I.V.:394.9] Out: 550 [Urine:550] Intake/Output from this shift:    Labs:  Recent Labs  04/14/14 0427 04/15/14 0434 04/16/14 0900 04/16/14 1006  WBC 4.9 12.1* 8.1 DUPLICATE  HGB 13.1 13.4 13.8 DUPLICATE  PLT 150 187 157 DUPLICATE  CREATININE 2.33* 3.07* 4.26*  --    Estimated Creatinine Clearance: 26.6 mL/min (by C-G formula based on Cr of 4.26). No results for input(s): VANCOTROUGH, VANCOPEAK, VANCORANDOM, GENTTROUGH, GENTPEAK, GENTRANDOM, TOBRATROUGH, TOBRAPEAK, TOBRARND, AMIKACINPEAK, AMIKACINTROU, AMIKACIN in the last 72 hours.   Microbiology: Recent Results (from the past 720 hour(s))  MRSA PCR Screening     Status: Abnormal   Collection Time: 04/13/14  4:00 PM  Result Value Ref Range Status   MRSA by PCR POSITIVE (A) NEGATIVE Final    Comment:        The GeneXpert MRSA Assay (FDA approved for NASAL specimens only), is one component of a comprehensive MRSA colonization surveillance program. It is not intended to diagnose MRSA infection nor to guide or monitor treatment for MRSA infections. RESULT CALLED TO, READ BACK BY AND VERIFIED WITH:  CUMMINGS,R @ 2205 ON 04/13/14 BY WOODIE,J   Culture, blood (x 2)     Status: None (Preliminary result)   Collection Time: 04/16/14 10:06 AM  Result Value Ref Range Status   Specimen Description BLOOD  Final   Special Requests NONE  Final   Culture NO GROWTH <24 HRS  Final   Report Status PENDING  Incomplete  Culture, blood (x 2)      Status: None (Preliminary result)   Collection Time: 04/16/14 10:16 AM  Result Value Ref Range Status   Specimen Description BLOOD  Final   Special Requests NONE  Final   Culture NO GROWTH <24 HRS  Final   Report Status PENDING  Incomplete   Medical History: Past Medical History  Diagnosis Date  . Hypertension   . Diabetes mellitus    Zosyn 3/21 >>  Assessment: 59yo male intubated in ICU.  Lactic acid elevated.  Pt with possible sepsis, aspiration pneumonia.  SCr is elevated, NClCr is borderline for dose adjustment.  Goal of Therapy:  Eradicate infection.  Plan:  Zosyn 3.375gm IV q8h, each dose over 4 hrs Monitor labs, progress, renal fxn, and cultures  Valrie HartHall, Felina Tello A 04/16/2014,1:17 PM

## 2014-04-16 NOTE — Progress Notes (Signed)
Consulting cardiologist: Prentice Docker  Primary Cardiologist: New-Normally followed by [facility].   Cardiology Specific Problem List:  1. Systolic CHF 2. Tachycardia  Subjective:    Patient intubated. Events of weekend reviewed, spoke with Dr. Rhona Leavens. Patient became tachypneac, and tachycardic. Felt to be in DT's. Central line placed by Dr. Lovell Sheehan. Echo with EF of 20-25%, Grade III diastolic dysfunction with restrictive pattern. Now in renal failure with elevated Lactic Acid of 4.5. .   Objective:   Temp:  [97.4 F (36.3 C)-98 F (36.7 C)] 98 F (36.7 C) (03/21 0400) Pulse Rate:  [25-99] 74 (03/21 0515) Resp:  [12-36] 16 (03/21 0515) BP: (49-133)/(42-92) 87/64 mmHg (03/21 0903) SpO2:  [79 %-100 %] 100 % (03/21 0515) FiO2 (%):  [50 %-100 %] 50 % (03/21 0400) Weight:  [305 lb 12.5 oz (138.7 kg)] 305 lb 12.5 oz (138.7 kg) (03/21 0500) Last BM Date: 04/13/14  Filed Weights   04/14/14 0500 04/15/14 0500 04/16/14 0500  Weight: 300 lb 14.9 oz (136.5 kg) 301 lb 2.4 oz (136.6 kg) 305 lb 12.5 oz (138.7 kg)    Intake/Output Summary (Last 24 hours) at 04/16/14 0907 Last data filed at 04/16/14 0500  Gross per 24 hour  Intake 394.92 ml  Output    550 ml  Net -155.08 ml    Telemetry: SR vs slow atrial flutter, rates in the 70's with PVC's   Exam:  General: Intubated sedated.   HEENT: Conjunctiva and lids normal, oropharynx clear.  Lungs: Coarse breath sounds on the vent.  Cardiac: No elevated JVP or bruits. IRRR, no gallop or rub.   Abdomen: Hyo-to active bowel sounds, nontender, distended.  Extremities: No pitting edema, distal pulses full.  Neuropsychiatric:Sedated.   Echo:  04/14/2014 The left ventricular systolic function is severely decreased with diffuse hypokinesis and paradoxical septal motion. There is restrictive pattern of diastolic dysfunction with elevated filling pressures. Left atrium is severely dilated. Right ventricular systolic function is  moderately to severely reduced. RVSP is most probably underestimated secondary to RV dysfunction. IVC is dilated.  Lab Results:  Basic Metabolic Panel:  Recent Labs Lab 04/13/14 1110 04/14/14 0427 04/15/14 0434  NA 135 134* 133*  K 3.7 4.4 4.0  CL 99 99 99  CO2 GLUCOSE 95 223* 79  BUN 29* 37* 47*  CREATININE 1.98* 2.33* 3.07*  CALCIUM 9.2 9.1 9.6  MG  --   --  1.8    Liver Function Tests:  Recent Labs Lab 04/13/14 1545 04/14/14 0427 04/15/14 0434  AST 61* 52* 43*  ALT ALKPHOS 97 95 91  BILITOT 2.1* 1.8* 1.4*  PROT 7.2 6.9 7.2  ALBUMIN 3.7 3.4* 3.7    CBC:  Recent Labs Lab 04/13/14 1110 04/14/14 0427 04/15/14 0434  WBC 6.7 4.9 12.1*  HGB 13.2 13.1 13.4  HCT 38.9* 38.6* 40.1  MCV 94.0 94.4 94.4  PLT 151 150 187    Cardiac Enzymes:  Recent Labs Lab 04/13/14 1545 04/13/14 2224 04/14/14 0427  TROPONINI 0.08* 0.05* 0.04*    Radiology: Ct Abdomen Pelvis Wo Contrast  04/15/2014   CLINICAL DATA:  Initial evaluation for abdominal pain with concern for renal obstruction or small bowel obstruction as well as ascites, personal history of diabetes and hypertension  EXAM: CT ABDOMEN AND PELVIS WITHOUT CONTRAST  TECHNIQUE: Multidetector CT imaging of the abdomen and pelvis was performed following the standard protocol without IV contrast.  COMPARISON:  04/14/2014  FINDINGS: There is a  small volume of ascites in the abdomen and pelvis. Liver is normal as is the gallbladder. The spleen is normal. Pancreas appears normal. There are peripancreatic lymph nodes in the region of the portal vein. There is suggestion of mild inflammatory change around the pancreas. There is also possibly mild inflammatory change inferior to the pancreas around the duodenum.  Adrenal glands are normal. Kidneys are normal. There are no renal or ureteral calculi. There is no evidence of hydronephrosis. There is moderate bilateral perinephric inflammatory change.   Reproductive organs are normal. Bladder is contracted and mildly hyper attenuating. There is mild inflammatory change around the bladder.  There is mild calcification of the abdominal aorta. There is a nonobstructive bowel gas pattern. There are no abnormally dilated loops of small or large bowel. Appendix appears nondilated. Stomach is normal.  There are no acute musculoskeletal findings. There is coronary artery calcification. There is mild cardiac enlargement. There is trace left pleural fluid. There is a small right pleural effusion. There is mildly increased attenuation in the body wall suggesting possibility of mild anasarca.  IMPRESSION: Mild ascites in the abdomen and pelvis. Additionally, there is possible inflammatory change in the region of the pancreas that could indicate pancreatitis. There is also inflammatory change in the perinephric space bilaterally as well as around the bladder. Cystitis with pyelonephritis not excluded.  Bladder is essentially entirely decompressed and appears somewhat hyper attenuating. This may reflect concentrated urine. Correlate for any evidence of hematuria however.  Small pleural effusions   Electronically Signed   By: Esperanza Heir M.D.   On: 04/15/2014 18:04   Dg Chest 1 View  04/15/2014   CLINICAL DATA:  Endotracheal tube placement.  Initial encounter.  EXAM: CHEST  1 VIEW  COMPARISON:  Chest radiograph performed earlier today at 4:17 p.m.  FINDINGS: The patient's endotracheal tube is seen ending 4 cm above the carina.  The lungs are hypoexpanded. Vascular crowding and vascular congestion are seen. Mild left-sided opacity likely reflects atelectasis. No definite pleural effusion or pneumothorax is seen.  The cardiomediastinal silhouette is borderline enlarged. No acute osseous abnormalities are identified.  IMPRESSION: 1. Endotracheal tube seen ending 4 cm above the carina. 2. Lungs hypoexpanded. Vascular congestion and borderline cardiomegaly noted. Mild  left-sided airspace opacity likely reflects atelectasis.   Electronically Signed   By: Roanna Raider M.D.   On: 04/15/2014 20:19   Dg Chest Port 1 View  04/15/2014   CLINICAL DATA:  CHF  EXAM: PORTABLE CHEST - 1 VIEW  COMPARISON:  04/13/2014  FINDINGS: Lungs are clear. No frank interstitial edema. No pleural effusion or pneumothorax.  Cardiomegaly.  IMPRESSION: No evidence of acute cardiopulmonary disease.   Electronically Signed   By: Charline Bills M.D.   On: 04/15/2014 16:46   US Abdomen Limited Ruq  04/14/2014   CLINICAL DATA:  Ethanol abuse  EXAM: US ABDOMEN LIMITED - RIGHT UPPER QUADRANT  COMPARISON:  None.  FINDINGS: Gallbladder:  No gallstones or wall thickening visualized. No sonographic Murphy sign noted.  Common bile duct:  Diameter: Normal diameter at 4 mm.  Liver:  There is uniform increase in liver echogenicity. No biliary duct dilatation. Fine nodular contour to the liver. No ascites.  IMPRESSION: 1. Normal gallbladder. 2. Echogenic liver with fine nodular contour suggests mild cirrhosis. 3. No ascites.   Electronically Signed   By: Genevive Bi M.D.   On: 04/14/2014 13:18    Medications:   Scheduled Medications: . aspirin  81 mg Oral Daily  .  Chlorhexidine Gluconate Cloth  6 each Topical Q0600  . folic acid  1 mg Oral Daily  . furosemide  40 mg Intravenous TID  . heparin  5,000 Units Subcutaneous 3 times per day  . insulin aspart  0-15 Units Subcutaneous TID WC  . insulin aspart  0-5 Units Subcutaneous QHS  . insulin detemir  40 Units Subcutaneous Daily  . LORazepam  0-4 mg Intravenous Q12H  . methylPREDNISolone (SOLU-MEDROL) injection  40 mg Intravenous Q24H  . metoprolol tartrate  25 mg Oral BID  . multivitamin with minerals  1 tablet Oral Daily  . mupirocin ointment  1 application Nasal BID  . sodium chloride  10-40 mL Intracatheter Q12H  . sodium chloride  3 mL Intravenous Q12H  . thiamine  100 mg Oral Daily   Or  . thiamine  100 mg Intravenous Daily     Infusions: . dexmedetomidine 0.4 mcg/kg/hr (04/16/14 0857)  . DOPamine Stopped (04/16/14 0856)    PRN Medications: diphenhydrAMINE, labetalol, LORazepam **OR** LORazepam, naphazoline, ondansetron **OR** ondansetron (ZOFRAN) IV, sodium chloride   Assessment and Plan:   1. Systolic Dysfunction: EF of 20-25%. He is positive on fluids and now intubated. I have d/c's diltiazem, which was actually discontinued last evening. He is on pressors. He is not responding to IV lasix with worsening renal function creatinine of 3.07 up from admission of 1.9. [facility] Cath once renal function improves. HR is controlled. Consider inotropic support, but will discuss with Dr. Purvis SheffieldKoneswaran.   2. Diastolic Dysfunction: Grade III, with restrictive pattern. Long standing ETOH abuse and hypertension may be contributing.   3. Abnormal CT of abdomen: Ascities and pancreatic inflammation. Lactic Acid is elevated along with LFT's. Bowel sounds are hypoactive to absent.   4. Leukocytosis: Received steroids on admission due to presumed allergic reaction.    Bettey MareKathryn M. Lawrence NP [facility]  04/16/2014, 9:07 AM   The patient was seen and examined, and I agree with the assessment and plan as documented above, with modifications as noted below. Pt intubated on 3/20 for acute hypoxic respiratory failure deemed secondary to hyperventilation due to DT's. Remains hypotensive. Echo showed biventricular heart failure with high degree of suspicion for alcoholic cardiomyopathy. Spoke with wife today who says he drinks at least one large beer daily and she thinks he also drinks with friends during the day.  Not making urine with acute renal failure and lactic acidosis. Currently on dopamine. Will switch to dobutamine for inotropic support and switch scheduled IV Lasix to continuous infusion. Telemetry demonstrates atrial flutter. On SQ heparin. Will d/c metoprolol for now. Will eventually need carvedilol or  metoprolol succinate. Given hypotension, pulmonary embolism also in differential diagnosis with RV dysfunction noted. Cannot get VQ scan at present. D-dimer would be elevated regardless. Will obtain lower extremity Dopplers. Time spent: 90 minutes critical care time.

## 2014-04-16 NOTE — Consult Note (Signed)
Full note to follow. Pt seen and examined. No possibility of weaning /extubation now

## 2014-04-16 NOTE — Progress Notes (Signed)
ANTICOAGULATION CONSULT NOTE - Initial Consult  Pharmacy Consult for Heparin Indication: DVT  Allergies  Allergen Reactions  . Ace Inhibitors Swelling  . Other Hives    wool    Patient Measurements: Height: 6\' 1"  (185.4 cm) Weight: (!) 305 lb 12.5 oz (138.7 kg) IBW/kg (Calculated) : 79.9 Heparin Dosing Weight: 111.4 kg  Vital Signs: Temp: 96 F (35.6 C) (03/21 1247) Temp Source: Axillary (03/21 1247) BP: 127/85 mmHg (03/21 1230) Pulse Rate: 82 (03/21 1230)  Labs:  Recent Labs  04/13/14 2224  04/14/14 0427 04/15/14 0434 04/16/14 0900 04/16/14 1006  HGB  --   < > 13.1 13.4 13.8 DUPLICATE  HCT  --   < > 38.6* 40.1 41.6 DUPLICATE  PLT  --   < > 150 187 157 DUPLICATE  APTT  --   --   --   --   --  31  LABPROT  --   --   --   --   --  19.1*  INR  --   --   --   --   --  1.59*  CREATININE  --   --  2.33* 3.07* 4.26*  --   TROPONINI 0.05*  --  0.04*  --   --   --   < > = values in this interval not displayed.  Estimated Creatinine Clearance: 26.6 mL/min (by C-G formula based on Cr of 4.26).   Medical History: Past Medical History  Diagnosis Date  . Hypertension   . Diabetes mellitus     Medications:  Scheduled:  . antiseptic oral rinse  7 mL Mouth Rinse QID  . [START ON 04/17/2014] aspirin  81 mg Per Tube Daily  . chlorhexidine  15 mL Mouth Rinse BID  . Chlorhexidine Gluconate Cloth  6 each Topical Q0600  . [START ON 04/17/2014] folic acid  1 mg Per Tube Daily  . heparin  4,000 Units Intravenous Once  . insulin aspart  0-15 Units Subcutaneous 6 times per day  . [START ON 04/17/2014] insulin detemir  15 Units Subcutaneous Daily  . LORazepam  0-4 mg Intravenous Q12H  . methylPREDNISolone (SOLU-MEDROL) injection  40 mg Intravenous Q24H  . [START ON 04/17/2014] multivitamin with minerals  1 tablet Per Tube Daily  . mupirocin ointment  1 application Nasal BID  . piperacillin-tazobactam (ZOSYN)  IV  3.375 g Intravenous Q8H  . sodium chloride  10-40 mL Intracatheter  Q12H  . sodium chloride  3 mL Intravenous Q12H  . thiamine  100 mg Oral Daily   Or  . thiamine  100 mg Intravenous Daily    Assessment: Bilateral lower extremity venous doppler ultrasound positive for DVT involving the right posterior tibial vein and one of the paired left posterior tibial veins. Obese patient, reduced renal function Labs reviewed  Goal of Therapy:  Heparin level 0.3-0.7 units/ml Monitor platelets by anticoagulation protocol: Yes   Plan:  Give 4000 units bolus x 1 Start heparin infusion at 1800 units/hr Check anti-Xa level in 6 hours and daily while on heparin Continue to monitor H&H and platelets  Raquel Jamesittman, Gregoire Bennis Bennett 04/16/2014,4:08 PM

## 2014-04-16 NOTE — Clinical Documentation Improvement (Signed)
Supporting Information: Patient being treated for suspected sepsis per Pharmacy 3/21 progress notes.   Labs: Lactic acid: 3/21:  3.2. 3/20:  4.5.     Possible Clinical Condition: . Bacteremia (positive blood cultures only) . Sepsis with UTI, versus UTI only . Sepsis-specify causative organism if known . Sepsis due to: --Device --Implant --Graft --Infusion . Severe sepsis-sepsis with organ dysfunction --Specify organ dysfunction Respiratory failure Encephalopathy Acute kidney failure Other (specify) . SIRS (Systemic Inflammatory Response Syndrome --With or without organ dysfunction . Document septic shock if present . Document any associated diagnoses/conditions     Thank Gabriel CirriYou,  Chyrel Taha Mathews-Bethea,RN,BSN,Clinical Documentation Specialist [phone] [email or web address]

## 2014-04-17 ENCOUNTER — Inpatient Hospital Stay (HOSPITAL_COMMUNITY): Payer: Medicare Other

## 2014-04-17 LAB — GLUCOSE, CAPILLARY
GLUCOSE-CAPILLARY: 102 mg/dL — AB (ref 70–99)
GLUCOSE-CAPILLARY: 141 mg/dL — AB (ref 70–99)
Glucose-Capillary: 149 mg/dL — ABNORMAL HIGH (ref 70–99)
Glucose-Capillary: 167 mg/dL — ABNORMAL HIGH (ref 70–99)
Glucose-Capillary: 104 mg/dL — ABNORMAL HIGH (ref 70–99)
Glucose-Capillary: 117 mg/dL — ABNORMAL HIGH (ref 70–99)
Glucose-Capillary: 195 mg/dL — ABNORMAL HIGH (ref 70–99)

## 2014-04-17 LAB — BLOOD GAS, ARTERIAL
ACID-BASE DEFICIT: 4.5 mmol/L — AB (ref 0.0–2.0)
Bicarbonate: 19.1 mEq/L — ABNORMAL LOW (ref 20.0–24.0)
Drawn by: 21310
FIO2: 40 %
MECHVT: 700 mL
O2 SAT: 98.3 %
PCO2 ART: 29.7 mmHg — AB (ref 35.0–45.0)
PEEP: 5 cmH2O
PO2 ART: 126 mmHg — AB (ref 80.0–100.0)
Patient temperature: 37
RATE: 12 resp/min
TCO2: 16.6 mmol/L (ref 0–100)
pH, Arterial: 7.424 (ref 7.350–7.450)

## 2014-04-17 LAB — BASIC METABOLIC PANEL
ANION GAP: 15 (ref 5–15)
BUN: 77 mg/dL — AB (ref 6–23)
CALCIUM: 9 mg/dL (ref 8.4–10.5)
CO2: 20 mmol/L (ref 19–32)
CREATININE: 4.25 mg/dL — AB (ref 0.50–1.35)
Chloride: 99 mmol/L (ref 96–112)
GFR calc non Af Amer: 14 mL/min — ABNORMAL LOW (ref >90)
GFR, EST AFRICAN AMERICAN: 16 mL/min — AB (ref >90)
Glucose, Bld: 109 mg/dL — ABNORMAL HIGH (ref 70–99)
POTASSIUM: 4.1 mmol/L (ref 3.5–5.1)
SODIUM: 134 mmol/L — AB (ref 135–145)

## 2014-04-17 LAB — CBC
HCT: 40.8 % (ref 39.0–52.0)
HEMOGLOBIN: 14.2 g/dL (ref 13.0–17.0)
MCH: 32.1 pg (ref 26.0–34.0)
MCHC: 34.8 g/dL (ref 30.0–36.0)
MCV: 92.1 fL (ref 78.0–100.0)
PLATELETS: 121 10*3/uL — AB (ref 150–400)
RBC: 4.43 MIL/uL (ref 4.22–5.81)
RDW: 13.3 % (ref 11.5–15.5)
WBC: 9.1 10*3/uL (ref 4.0–10.5)

## 2014-04-17 LAB — PROTIME-INR
INR: 1.74 — AB (ref 0.00–1.49)
Prothrombin Time: 20.5 seconds — ABNORMAL HIGH (ref 11.6–15.2)

## 2014-04-17 LAB — MAGNESIUM: Magnesium: 2.1 mg/dL (ref 1.5–2.5)

## 2014-04-17 LAB — HEPARIN LEVEL (UNFRACTIONATED)
Heparin Unfractionated: 1.02 IU/mL — ABNORMAL HIGH (ref 0.30–0.70)
Heparin Unfractionated: 1.04 IU/mL — ABNORMAL HIGH (ref 0.30–0.70)
Heparin Unfractionated: 1.1 IU/mL — ABNORMAL HIGH (ref 0.30–0.70)

## 2014-04-17 LAB — HOMOCYSTEINE: HOMOCYSTEINE-NORM: 56.6 umol/L — AB (ref 0.0–15.0)

## 2014-04-17 MED ORDER — VANCOMYCIN HCL 10 G IV SOLR
2000.0000 mg | INTRAVENOUS | Status: DC
  Administered 2014-04-19: 2000 mg via INTRAVENOUS
  Filled 2014-04-17: qty 2000

## 2014-04-17 MED ORDER — VITAL HIGH PROTEIN PO LIQD: 1000.0000 mL | ORAL | Status: DC

## 2014-04-17 MED ORDER — INSULIN DETEMIR 100 UNIT/ML ~~LOC~~ SOLN
20.0000 [IU] | Freq: Every day | SUBCUTANEOUS | Status: DC
  Administered 2014-04-18 – 2014-04-21 (×4): 20 [IU] via SUBCUTANEOUS
  Filled 2014-04-17 (×6): qty 0.2

## 2014-04-17 MED ORDER — PANTOPRAZOLE SODIUM 40 MG IV SOLR
40.0000 mg | Freq: Once | INTRAVENOUS | Status: AC
  Administered 2014-04-17: 40 mg via INTRAVENOUS
  Filled 2014-04-17: qty 40

## 2014-04-17 MED ORDER — PRO-STAT SUGAR FREE PO LIQD
30.0000 mL | Freq: Two times a day (BID) | ORAL | Status: DC
  Administered 2014-04-17 – 2014-04-23 (×11): 30 mL
  Filled 2014-04-17 (×10): qty 30

## 2014-04-17 MED ORDER — VANCOMYCIN HCL 10 G IV SOLR
2000.0000 mg | Freq: Once | INTRAVENOUS | Status: AC
  Administered 2014-04-17: 2000 mg via INTRAVENOUS
  Filled 2014-04-17: qty 2000

## 2014-04-17 MED ORDER — DEXMEDETOMIDINE HCL IN NACL 200 MCG/50ML IV SOLN
INTRAVENOUS | Status: AC
  Filled 2014-04-17: qty 50

## 2014-04-17 MED ORDER — HEPARIN (PORCINE) IN NACL 100-0.45 UNIT/ML-% IJ SOLN
1100.0000 [IU]/h | INTRAMUSCULAR | Status: DC
  Administered 2014-04-17 – 2014-04-18 (×2): 800 [IU]/h via INTRAVENOUS
  Administered 2014-04-19: 900 [IU]/h via INTRAVENOUS
  Administered 2014-04-19: 850 [IU]/h via INTRAVENOUS
  Administered 2014-04-20 – 2014-04-23 (×4): 900 [IU]/h via INTRAVENOUS
  Filled 2014-04-17 (×6): qty 250

## 2014-04-17 MED ORDER — VITAL AF 1.2 CAL PO LIQD
1000.0000 mL | ORAL | Status: DC
  Administered 2014-04-17 – 2014-04-21 (×6): 1000 mL
  Filled 2014-04-17 (×10): qty 1000

## 2014-04-17 NOTE — Progress Notes (Addendum)
Gabriel Reynolds  MRN: [id number]  DOB/AGE: 05/21/1955 59 y.o.  Primary Care Physician:Pcp Not In System  Admit date: 04/13/2014  Chief Complaint:  Chief Complaint  Patient presents with  . Facial Swelling  . Leg Swelling    S-Pt presented on  04/13/2014 with  Chief Complaint  Patient presents with  . Facial Swelling  . Leg Swelling  .    Pt  Intubated, unable to offers any complaints  Meds  . antiseptic oral rinse  7 mL Mouth Rinse QID  . aspirin  81 mg Per Tube Daily  . chlorhexidine  15 mL Mouth Rinse BID  . Chlorhexidine Gluconate Cloth  6 each Topical Q0600  . folic acid  1 mg Per Tube Daily  . insulin aspart  0-15 Units Subcutaneous 6 times per day  . insulin detemir  15 Units Subcutaneous Daily  . LORazepam  0-4 mg Intravenous Q12H  . methylPREDNISolone (SOLU-MEDROL) injection  40 mg Intravenous Q24H  . multivitamin with minerals  1 tablet Per Tube Daily  . mupirocin ointment  1 application Nasal BID  . piperacillin-tazobactam (ZOSYN)  IV  3.375 g Intravenous Q8H  . sodium chloride  10-40 mL Intracatheter Q12H  . sodium chloride  3 mL Intravenous Q12H  . thiamine  100 mg Oral Daily   Or  . thiamine  100 mg Intravenous Daily  . vancomycin  2,000 mg Intravenous Once  . [START ON 04/19/2014] vancomycin  2,000 mg Intravenous Q48H      Physical Exam: Vital signs in last 24 hours: Temp:  [96 F (35.6 C)-97.6 F (36.4 C)] 96.6 F (35.9 C) (03/22 0747) Pulse Rate:  [37-117] 117 (03/22 0800) Resp:  [12-21] 14 (03/22 0800) BP: (62-161)/(43-107) 149/103 mmHg (03/22 0800) SpO2:  [85 %-100 %] 99 % (03/22 0800) FiO2 (%):  [40 %-50 %] 40 % (03/22 0838) Weight:  [304 lb 10.8 oz (138.2 kg)] 304 lb 10.8 oz (138.2 kg) (03/22 0500) Weight change: -1 lb 1.6 oz (-0.5 kg) Last BM Date: 04/13/14  Intake/Output from previous day: 03/21 0701 - 03/22 0700 In: 971.3 [I.V.:761.3; NG/GT:60; IV Piggyback:150] Out: 2100 [Urine:2100]     Physical Exam: General-  intubated Resp-ET tube in situ , Rhonchi + CVS- S1S2 regular in rate and tachycardiac GIT- BS+, soft, NT, ND EXT- 2+ LE Edema, No Cyanosis   Lab Results: CBC  Recent Labs  04/16/14 1006 04/17/14 0426  WBC DUPLICATE 9.1  HGB DUPLICATE 14.2  HCT DUPLICATE 40.8  PLT DUPLICATE 121*    BMET  Recent Labs  04/16/14 0900 04/17/14 0426  NA 133* 134*  K 5.0 4.1  CL 99 99  CO2 22 20  GLUCOSE 154* 109*  BUN 65* 77*  CREATININE 4.26* 4.25*  CALCIUM 8.9 9.0   Creat trend 2016 1.98=> 4.25-4.26 2009  1.4   MICRO Recent Results (from the past 240 hour(s))  MRSA PCR Screening     Status: Abnormal   Collection Time: 04/13/14  4:00 PM  Result Value Ref Range Status   MRSA by PCR POSITIVE (A) NEGATIVE Final    Comment:        The GeneXpert MRSA Assay (FDA approved for NASAL specimens only), is one component of a comprehensive MRSA colonization surveillance program. It is not intended to diagnose MRSA infection nor to guide or monitor treatment for MRSA infections. RESULT CALLED TO, READ BACK BY AND VERIFIED WITH:  CUMMINGS,R @ 2205 ON 04/13/14 BY WOODIE,J   Culture, blood (x 2)  Status: None (Preliminary result)   Collection Time: 04/16/14 10:06 AM  Result Value Ref Range Status   Specimen Description BLOOD  Final   Special Requests NONE  Final   Culture NO GROWTH <24 HRS  Final   Report Status PENDING  Incomplete  Culture, blood (x 2)     Status: None (Preliminary result)   Collection Time: 04/16/14 10:16 AM  Result Value Ref Range Status   Specimen Description BLOOD  Final   Special Requests NONE  Final   Culture   Final    GRAM POSITIVE COCCI IN CHAINS Gram Stain Report Called to,Read Back By and Verified With: MISTY MCDANIEL RN ON [id number] AT 0700 BY RESSEGGER R    Report Status PENDING  Incomplete      Lab Results  Component Value Date   CALCIUM 9.0 04/17/2014   CAION 1.21 11/13/2007         Impression: 1)Renal  AKI secondary to Prerenal/  hepatorenal/ATN/Cardiorenal/ACE                 AKI now at plateau                Pt urine output now much better                AKI on CKD ( Most likley )               CKD stage not sure as last creat was 1.4 from 2009.               CKD since 2009 ( most likley , not much data available)               CKD secondary to Cardiorenal                Progression of CKD now marked with AKI               2)CVS- hemodynamiclly stable  3)Anemia HGb at goal (9--11)   4)Resp- admitted with resp failure/pneumonia  now intubated On IV abx  5)CHF- Alcoholic Cardiomyopathy  Primary MD and Cardiology following  6)Electrolytes Normokalemic Hyponatremic  better  7)Acid base Co2 at goal   8) DVT- on Iv Heprain  9) ID- Blood Cultures positive  on Vanco + Zosyn   Plan:  Will continue current care       Norval Slaven S 04/17/2014, 9:09 AM

## 2014-04-17 NOTE — Progress Notes (Signed)
Subjective: He remains intubated and on the ventilator. He is a bit more alert.  Objective: Vital signs in last 24 hours: Temp:  [96.3 F (35.7 C)-97.6 F (36.4 C)] 96.5 F (35.8 C) (03/22 1654) Pulse Rate:  [56-125] 124 (03/22 1800) Resp:  [12-19] 15 (03/22 1800) BP: (114-153)/(76-107) 127/78 mmHg (03/22 1800) SpO2:  [98 %-100 %] 100 % (03/22 1800) FiO2 (%):  [40 %] 40 % (03/22 1657) Weight:  [138.2 kg (304 lb 10.8 oz)] 138.2 kg (304 lb 10.8 oz) (03/22 0500) Weight change: -0.5 kg (-1 lb 1.6 oz) Last BM Date: 04/13/14  Intake/Output from previous day: 03/21 0701 - 03/22 0700 In: 971.3 [I.V.:761.3; NG/GT:60; IV Piggyback:150] Out: 2100 [Urine:2100]  PHYSICAL EXAM General appearance: intubated sedated but more alert Resp: rhonchi bilaterally Cardio: regular rate and rhythm, S1, S2 normal, no murmur, click, rub or gallop GI: soft, non-tender; bowel sounds normal; no masses,  no organomegaly Extremities: extremities normal, atraumatic, no cyanosis or edema  Lab Results:  Results for orders placed or performed during the hospital encounter of 04/13/14 (from the past 48 hour(s))  Lactic acid, plasma     Status: Abnormal   Collection Time: 04/15/14  8:00 PM  Result Value Ref Range   Lactic Acid, Venous 4.5 (HH) 0.5 - 2.0 mmol/L    Comment: MODERATE HEMOLYSIS CRITICAL RESULT CALLED TO, READ BACK BY AND VERIFIED WITH: WAGONER,R AT 2055 ON 04/15/14 BY MOSLEY,   Lipase, blood     Status: None   Collection Time: 04/15/14  8:00 PM  Result Value Ref Range   Lipase 25 11 - 59 U/L  Glucose, capillary     Status: Abnormal   Collection Time: 04/15/14  9:19 PM  Result Value Ref Range   Glucose-Capillary 109 (H) 70 - 99 mg/dL   Comment 1 Notify RN   Blood gas, arterial     Status: Abnormal   Collection Time: 04/15/14 10:23 PM  Result Value Ref Range   FIO2 100.00 %   Delivery systems VENTILATOR    Mode PRESSURE REGULATED VOLUME CONTROL    VT 700 mL   Rate 12 resp/min   Peep/cpap  5.0 cm H20   pH, Arterial 7.348 (L) 7.350 - 7.450   pCO2 arterial 31.8 (L) 35.0 - 45.0 mmHg   pO2, Arterial 134.0 (H) 80.0 - 100.0 mmHg   Bicarbonate 17.0 (L) 20.0 - 24.0 mEq/L   TCO2 15.1 0 - 100 mmol/L   Acid-base deficit 7.5 (H) 0.0 - 2.0 mmol/L   O2 Saturation 98.0 %   Patient temperature 37.0    Collection site RIGHT RADIAL    Drawn by [id number]    Sample type ARTERIAL    Allens test (pass/fail) PASS PASS  Glucose, capillary     Status: Abnormal   Collection Time: 04/16/14  7:40 AM  Result Value Ref Range   Glucose-Capillary 123 (H) 70 - 99 mg/dL   Comment 1 Notify RN    Comment 2 Document in Chart   Comprehensive metabolic panel     Status: Abnormal   Collection Time: 04/16/14  9:00 AM  Result Value Ref Range   Sodium 133 (L) 135 - 145 mmol/L   Potassium 5.0 3.5 - 5.1 mmol/L    Comment: DELTA CHECK NOTED   Chloride 99 96 - 112 mmol/L   CO2 22 19 - 32 mmol/L   Glucose, Bld 154 (H) 70 - 99 mg/dL   BUN 65 (H) 6 - 23 mg/dL   Creatinine, Ser 4.26 (H)  0.50 - 1.35 mg/dL   Calcium 8.9 8.4 - 10.5 mg/dL   Total Protein 6.4 6.0 - 8.3 g/dL   Albumin 3.1 (L) 3.5 - 5.2 g/dL   AST 97 (H) 0 - 37 U/L   ALT 54 (H) 0 - 53 U/L   Alkaline Phosphatase 81 39 - 117 U/L   Total Bilirubin 1.8 (H) 0.3 - 1.2 mg/dL   GFR calc non Af Amer 14 (L) >90 mL/min   GFR calc Af Amer 16 (L) >90 mL/min    Comment: (NOTE) The eGFR has been calculated using the CKD EPI equation. This calculation has not been validated in all clinical situations. eGFR's persistently <90 mL/min signify possible Chronic Kidney Disease.    Anion gap 12 5 - 15  CBC     Status: None   Collection Time: 04/16/14  9:00 AM  Result Value Ref Range   WBC 8.1 4.0 - 10.5 K/uL   RBC 4.37 4.22 - 5.81 MIL/uL   Hemoglobin 13.8 13.0 - 17.0 g/dL   HCT 41.6 39.0 - 52.0 %   MCV 95.2 78.0 - 100.0 fL   MCH 31.6 26.0 - 34.0 pg   MCHC 33.2 30.0 - 36.0 g/dL   RDW 13.6 11.5 - 15.5 %   Platelets 157 150 - 400 K/uL  Magnesium     Status: None    Collection Time: 04/16/14  9:00 AM  Result Value Ref Range   Magnesium 1.7 1.5 - 2.5 mg/dL  Procalcitonin     Status: None   Collection Time: 04/16/14  9:00 AM  Result Value Ref Range   Procalcitonin 1.32 ng/mL    Comment:        Interpretation: PCT > 0.5 ng/mL and <= 2 ng/mL: Systemic infection (sepsis) is possible, but other conditions are known to elevate PCT as well. (NOTE)         ICU PCT Algorithm               Non ICU PCT Algorithm    ----------------------------     ------------------------------         PCT < 0.25 ng/mL                 PCT < 0.1 ng/mL     Stopping of antibiotics            Stopping of antibiotics       strongly encouraged.               strongly encouraged.    ----------------------------     ------------------------------       PCT level decrease by               PCT < 0.25 ng/mL       >= 80% from peak PCT       OR PCT 0.25 - 0.5 ng/mL          Stopping of antibiotics                                             encouraged.     Stopping of antibiotics           encouraged.    ----------------------------     ------------------------------       PCT level decrease by              PCT >=  0.25 ng/mL       < 80% from peak PCT        AND PCT >= 0.5 ng/mL             Continuing antibiotics                                              encouraged.       Continuing antibiotics            encouraged.    ----------------------------     ------------------------------     PCT level increase compared          PCT > 0.5 ng/mL         with peak PCT AND          PCT >= 0.5 ng/mL             Escalation of antibiotics                                          strongly encouraged.      Escalation of antibiotics        strongly encouraged.   Lactic acid, plasma     Status: Abnormal   Collection Time: 04/16/14  9:15 AM  Result Value Ref Range   Lactic Acid, Venous 3.2 (HH) 0.5 - 2.0 mmol/L    Comment: CRITICAL RESULT CALLED TO, READ BACK BY AND VERIFIED  WITH: PHILLIPS,C AT 10:05AM ON 04/16/14 BY FESTERMAN,C   Culture, blood (x 2)     Status: None (Preliminary result)   Collection Time: 04/16/14 10:06 AM  Result Value Ref Range   Specimen Description BLOOD PORTA CATH DRAWN BY RN    Special Requests BOTTLES DRAWN AEROBIC AND ANAEROBIC 6CC    Culture      GRAM POSITIVE COCCI IN CHAINS GRAM STAIN CALLED PREVIOUSLY TO MCDANIEL,M. AT 0700 BY RESSEGGER,R. Performed at [facility]    Report Status PENDING   CBC with Differential     Status: Abnormal   Collection Time: 04/16/14 10:06 AM  Result Value Ref Range   WBC DUPLICATE 4.0 - 60.6 K/uL   RBC DUPLICATE 3.01 - 6.01 MIL/uL   Hemoglobin DUPLICATE 09.3 - 23.5 g/dL   HCT DUPLICATE 57.3 - 22.0 %   MCV DUPLICATE 25.4 - 270.6 fL   MCH DUPLICATE 23.7 - 62.8 pg   MCHC DUPLICATE 31.5 - 17.6 g/dL   RDW DUPLICATE 16.0 - 73.7 %   Platelets DUPLICATE 106 - 269 K/uL   Neutrophils Relative % 81 (H) 43 - 77 %   Neutro Abs 6.4 1.7 - 7.7 K/uL   Lymphocytes Relative 13 12 - 46 %   Lymphs Abs 1.0 0.7 - 4.0 K/uL   Monocytes Relative 6 3 - 12 %   Monocytes Absolute 0.5 0.1 - 1.0 K/uL   Eosinophils Relative 0 0 - 5 %   Eosinophils Absolute 0.0 0.0 - 0.7 K/uL   Basophils Relative 0 0 - 1 %   Basophils Absolute 0.0 0.0 - 0.1 K/uL  Protime-INR     Status: Abnormal   Collection Time: 04/16/14 10:06 AM  Result Value Ref Range   Prothrombin Time 19.1 (H) 11.6 - 15.2 seconds   INR 1.59 (H) 0.00 - 1.49  APTT     Status: None   Collection Time: 04/16/14 10:06 AM  Result Value Ref Range   aPTT 31 24 - 37 seconds  Culture, blood (x 2)     Status: None (Preliminary result)   Collection Time: 04/16/14 10:16 AM  Result Value Ref Range   Specimen Description BLOOD    Special Requests NONE    Culture      GRAM POSITIVE COCCI IN CHAINS Gram Stain Report Called to,Read Back By and Verified With: MISTY MCDANIEL RN ON [id number] AT 0700 BY RESSEGGER R Performed at [facility] ([place])    Report Status  PENDING   Glucose, capillary     Status: Abnormal   Collection Time: 04/16/14 12:12 PM  Result Value Ref Range   Glucose-Capillary 155 (H) 70 - 99 mg/dL   Comment 1 Notify RN    Comment 2 Document in Chart   Lactic acid, plasma     Status: Abnormal   Collection Time: 04/16/14  1:17 PM  Result Value Ref Range   Lactic Acid, Venous 2.2 (HH) 0.5 - 2.0 mmol/L    Comment: RESULT REPEATED AND VERIFIED CRITICAL RESULT CALLED TO, READ BACK BY AND VERIFIED WITH: GRAY,M AT 1403 ON 04/16/14 BY WOODS,M   Glucose, capillary     Status: Abnormal   Collection Time: 04/16/14  4:46 PM  Result Value Ref Range   Glucose-Capillary 149 (H) 70 - 99 mg/dL   Comment 1 Notify RN    Comment 2 Document in Chart   Lactic acid, plasma     Status: None   Collection Time: 04/16/14  5:35 PM  Result Value Ref Range   Lactic Acid, Venous 1.6 0.5 - 2.0 mmol/L  Antithrombin III     Status: None   Collection Time: 04/16/14  5:35 PM  Result Value Ref Range   AntiThromb III Func 86 75 - 120 %    Comment: Performed at [facility]  Homocysteine, serum     Status: Abnormal   Collection Time: 04/16/14  5:35 PM  Result Value Ref Range   Homocysteine 56.6 (H) 0.0 - 15.0 umol/L    Comment: (NOTE) Performed At: [facility] [address] [place], [place] [id number] Lindon Romp MD SH:[id number]   Sodium, urine, random     Status: None   Collection Time: 04/16/14  6:05 PM  Result Value Ref Range   Sodium, Ur 54 mmol/L  Creatinine, urine, random     Status: None   Collection Time: 04/16/14  6:05 PM  Result Value Ref Range   Creatinine, Urine 66.89 mg/dL  Urinalysis, Routine w reflex microscopic     Status: Abnormal   Collection Time: 04/16/14  6:05 PM  Result Value Ref Range   Color, Urine YELLOW YELLOW   APPearance CLEAR CLEAR   Specific Gravity, Urine 1.020 1.005 - 1.030   pH 5.0 5.0 - 8.0   Glucose, UA NEGATIVE NEGATIVE mg/dL   Hgb urine dipstick MODERATE (A) NEGATIVE   Bilirubin Urine  NEGATIVE NEGATIVE   Ketones, ur NEGATIVE NEGATIVE mg/dL   Protein, ur NEGATIVE NEGATIVE mg/dL   Urobilinogen, UA 0.2 0.0 - 1.0 mg/dL   Nitrite NEGATIVE NEGATIVE   Leukocytes, UA SMALL (A) NEGATIVE  Urine microscopic-add on     Status: Abnormal   Collection Time: 04/16/14  6:05 PM  Result Value Ref Range   Squamous Epithelial / LPF MANY (A) RARE   WBC, UA 7-10 <3 WBC/hpf   RBC / HPF 11-20 <  3 RBC/hpf   Bacteria, UA MANY (A) RARE   Urine-Other AMORPHOUS URATES/PHOSPHATES   Glucose, capillary     Status: Abnormal   Collection Time: 04/16/14  7:30 PM  Result Value Ref Range   Glucose-Capillary 167 (H) 70 - 99 mg/dL  Heparin level (unfractionated)     Status: Abnormal   Collection Time: 04/16/14 10:00 PM  Result Value Ref Range   Heparin Unfractionated 0.93 (H) 0.30 - 0.70 IU/mL    Comment:        IF HEPARIN RESULTS ARE BELOW EXPECTED VALUES, AND PATIENT DOSAGE HAS BEEN CONFIRMED, SUGGEST FOLLOW UP TESTING OF ANTITHROMBIN III LEVELS.   Glucose, capillary     Status: Abnormal   Collection Time: 04/17/14 12:20 AM  Result Value Ref Range   Glucose-Capillary 141 (H) 70 - 99 mg/dL  Basic metabolic panel     Status: Abnormal   Collection Time: 04/17/14  4:26 AM  Result Value Ref Range   Sodium 134 (L) 135 - 145 mmol/L   Potassium 4.1 3.5 - 5.1 mmol/L    Comment: DELTA CHECK NOTED   Chloride 99 96 - 112 mmol/L   CO2 20 19 - 32 mmol/L   Glucose, Bld 109 (H) 70 - 99 mg/dL   BUN 77 (H) 6 - 23 mg/dL   Creatinine, Ser 4.25 (H) 0.50 - 1.35 mg/dL   Calcium 9.0 8.4 - 10.5 mg/dL   GFR calc non Af Amer 14 (L) >90 mL/min   GFR calc Af Amer 16 (L) >90 mL/min    Comment: (NOTE) The eGFR has been calculated using the CKD EPI equation. This calculation has not been validated in all clinical situations. eGFR's persistently <90 mL/min signify possible Chronic Kidney Disease.    Anion gap 15 5 - 15  CBC     Status: Abnormal   Collection Time: 04/17/14  4:26 AM  Result Value Ref Range   WBC  9.1 4.0 - 10.5 K/uL   RBC 4.43 4.22 - 5.81 MIL/uL   Hemoglobin 14.2 13.0 - 17.0 g/dL   HCT 40.8 39.0 - 52.0 %   MCV 92.1 78.0 - 100.0 fL   MCH 32.1 26.0 - 34.0 pg   MCHC 34.8 30.0 - 36.0 g/dL   RDW 13.3 11.5 - 15.5 %   Platelets 121 (L) 150 - 400 K/uL  Heparin level (unfractionated)     Status: Abnormal   Collection Time: 04/17/14  4:26 AM  Result Value Ref Range   Heparin Unfractionated 1.02 (H) 0.30 - 0.70 IU/mL    Comment: RESULTS CONFIRMED BY MANUAL DILUTION        IF HEPARIN RESULTS ARE BELOW EXPECTED VALUES, AND PATIENT DOSAGE HAS BEEN CONFIRMED, SUGGEST FOLLOW UP TESTING OF ANTITHROMBIN III LEVELS.   Protime-INR     Status: Abnormal   Collection Time: 04/17/14  4:26 AM  Result Value Ref Range   Prothrombin Time 20.5 (H) 11.6 - 15.2 seconds   INR 1.74 (H) 0.00 - 1.49  Magnesium     Status: None   Collection Time: 04/17/14  4:26 AM  Result Value Ref Range   Magnesium 2.1 1.5 - 2.5 mg/dL  Glucose, capillary     Status: Abnormal   Collection Time: 04/17/14  4:33 AM  Result Value Ref Range   Glucose-Capillary 102 (H) 70 - 99 mg/dL  Blood gas, arterial     Status: Abnormal   Collection Time: 04/17/14  4:50 AM  Result Value Ref Range   FIO2 40.00 %  Delivery systems VENTILATOR    Mode PRESSURE REGULATED VOLUME CONTROL    VT 700 mL   Rate 12.0 resp/min   Peep/cpap 5.0 cm H20   pH, Arterial 7.424 7.350 - 7.450   pCO2 arterial 29.7 (L) 35.0 - 45.0 mmHg   pO2, Arterial 126.0 (H) 80.0 - 100.0 mmHg   Bicarbonate 19.1 (L) 20.0 - 24.0 mEq/L   TCO2 16.6 0 - 100 mmol/L   Acid-base deficit 4.5 (H) 0.0 - 2.0 mmol/L   O2 Saturation 98.3 %   Patient temperature 37.0    Collection site RIGHT RADIAL    Drawn by [id number]    Sample type ARTERIAL    Allens test (pass/fail) PASS PASS  Glucose, capillary     Status: Abnormal   Collection Time: 04/17/14  7:25 AM  Result Value Ref Range   Glucose-Capillary 104 (H) 70 - 99 mg/dL   Comment 1 Notify RN    Comment 2 Document in Chart    Glucose, capillary     Status: Abnormal   Collection Time: 04/17/14 11:58 AM  Result Value Ref Range   Glucose-Capillary 117 (H) 70 - 99 mg/dL   Comment 1 Notify RN    Comment 2 Document in Chart   Heparin level (unfractionated)     Status: Abnormal   Collection Time: 04/17/14  1:34 PM  Result Value Ref Range   Heparin Unfractionated 1.04 (H) 0.30 - 0.70 IU/mL    Comment:        IF HEPARIN RESULTS ARE BELOW EXPECTED VALUES, AND PATIENT DOSAGE HAS BEEN CONFIRMED, SUGGEST FOLLOW UP TESTING OF ANTITHROMBIN III LEVELS.   Glucose, capillary     Status: Abnormal   Collection Time: 04/17/14  4:22 PM  Result Value Ref Range   Glucose-Capillary 195 (H) 70 - 99 mg/dL  Heparin level (unfractionated)     Status: Abnormal   Collection Time: 04/17/14  9:00 PM  Result Value Ref Range   Heparin Unfractionated 1.10 (H) 0.30 - 0.70 IU/mL    Comment: RESULTS CONFIRMED BY MANUAL DILUTION        IF HEPARIN RESULTS ARE BELOW EXPECTED VALUES, AND PATIENT DOSAGE HAS BEEN CONFIRMED, SUGGEST FOLLOW UP TESTING OF ANTITHROMBIN III LEVELS.     ABGS  Recent Labs  04/17/14 0450  PHART 7.424  PO2ART 126.0*  TCO2 16.6  HCO3 19.1*   CULTURES Recent Results (from the past 240 hour(s))  MRSA PCR Screening     Status: Abnormal   Collection Time: 04/13/14  4:00 PM  Result Value Ref Range Status   MRSA by PCR POSITIVE (A) NEGATIVE Final    Comment:        The GeneXpert MRSA Assay (FDA approved for NASAL specimens only), is one component of a comprehensive MRSA colonization surveillance program. It is not intended to diagnose MRSA infection nor to guide or monitor treatment for MRSA infections. RESULT CALLED TO, READ BACK BY AND VERIFIED WITH:  CUMMINGS,R @ 2205 ON 04/13/14 BY WOODIE,J   Culture, blood (x 2)     Status: None (Preliminary result)   Collection Time: 04/16/14 10:06 AM  Result Value Ref Range Status   Specimen Description BLOOD PORTA CATH DRAWN BY RN  Final   Special  Requests BOTTLES DRAWN AEROBIC AND ANAEROBIC Roberts  Final   Culture   Final    GRAM POSITIVE COCCI IN CHAINS GRAM STAIN CALLED PREVIOUSLY TO MCDANIEL,M. AT 0700 BY RESSEGGER,R. Performed at [facility]    Report Status PENDING  Incomplete  Culture, blood (x 2)     Status: None (Preliminary result)   Collection Time: 04/16/14 10:16 AM  Result Value Ref Range Status   Specimen Description BLOOD  Final   Special Requests NONE  Final   Culture   Final    GRAM POSITIVE COCCI IN CHAINS Gram Stain Report Called to,Read Back By and Verified With: MISTY MCDANIEL RN ON [id number] AT 0700 BY RESSEGGER R Performed at [facility]    Report Status PENDING  Incomplete   Studies/Results: Dg Chest 1 View  04/15/2014   CLINICAL DATA:  Endotracheal tube placement.  Initial encounter.  EXAM: CHEST  1 VIEW  COMPARISON:  Chest radiograph performed earlier today at 4:17 p.m.  FINDINGS: The patient's endotracheal tube is seen ending 4 cm above the carina.  The lungs are hypoexpanded. Vascular crowding and vascular congestion are seen. Mild left-sided opacity likely reflects atelectasis. No definite pleural effusion or pneumothorax is seen.  The cardiomediastinal silhouette is borderline enlarged. No acute osseous abnormalities are identified.  IMPRESSION: 1. Endotracheal tube seen ending 4 cm above the carina. 2. Lungs hypoexpanded. Vascular congestion and borderline cardiomegaly noted. Mild left-sided airspace opacity likely reflects atelectasis.   Electronically Signed   By: Garald Balding M.D.   On: 04/15/2014 20:19   US Venous Img Lower Bilateral  04/16/2014   CLINICAL DATA:  Bilateral leg and foot swelling for 2 weeks. Evaluate for DVT. The patient is currently on the ventilator.  EXAM: BILATERAL LOWER EXTREMITY VENOUS DOPPLER ULTRASOUND  TECHNIQUE: Gray-scale sonography with graded compression, as well as color Doppler and duplex ultrasound were performed to evaluate the lower extremity deep venous  systems from the level of the common femoral vein and including the common femoral, femoral, profunda femoral, popliteal and calf veins including the posterior tibial, peroneal and gastrocnemius veins when visible. The superficial great saphenous vein was also interrogated. Spectral Doppler was utilized to evaluate flow at rest and with distal augmentation maneuvers in the common femoral, femoral and popliteal veins.  COMPARISON:  None.  FINDINGS: RIGHT LOWER EXTREMITY  Common Femoral Vein: No evidence of thrombus. Normal compressibility.  Saphenofemoral Junction: No evidence of thrombus. Normal compressibility.  Profunda Femoral Vein: No evidence of thrombus. Normal compressibility.  Femoral Vein: No evidence of thrombus.  Normal compressibility.  Popliteal Vein: No evidence of thrombus.  Normal compressibility.  Calf Veins: There is hypoechoic expansile occlusive thrombus within the posterior tibial vein (representative images 38 through 42).  Superficial Great Saphenous Vein: No evidence of thrombus. Normal compressibility and flow on color Doppler imaging.  Venous Reflux:  None.  Other Findings: A minimal on subcutaneous edema is noted at the level of the calf and lower leg.  LEFT LOWER EXTREMITY  Common Femoral Vein: No evidence of thrombus. Normal compressibility, respiratory phasicity and response to augmentation.  Saphenofemoral Junction: No evidence of thrombus. Normal compressibility.  Profunda Femoral Vein: No evidence of thrombus. Normal compressibility.  Femoral Vein: No evidence of thrombus.  Normal compressibility.  Popliteal Vein: No evidence of thrombus. Slow flow is demonstrated within the left popliteal vein (image 81) however the vessel remains normally compressible at this location (representative image 84).  Calf Veins: There is hypoechoic nonocclusive thrombus within 1 of the paired left posterior tibial veins (image 99 through 101).  Superficial Great Saphenous Vein: No evidence of thrombus.  Normal compressibility and flow on color Doppler imaging.  Venous Reflux:  None.  Other Findings: There is age-indeterminate occlusive superficial thrombophlebitis involving the lesser saphenous  vein (Images 105 through portal 7). A minimal amount of subcutaneous edema is noted at the level of the left calf and ankle.  IMPRESSION: 1. Examination positive for DVT involving the right posterior tibial vein and one of the paired left posterior tibial veins, however there is no extension of this distal DVT to involve the more proximal deep venous systems of either lower extremity. 2. Examination is positive for age-indeterminate occlusive superficial thrombophlebitis involving the left lesser saphenous vein.   Electronically Signed   By: Sandi Mariscal M.D.   On: 04/16/2014 14:12   Dg Chest Port 1 View  04/17/2014   ADDENDUM REPORT: 04/17/2014 08:14  ADDENDUM: PICC tip is in the superior vena cava at the level of the azygos vein, retracted since the prior study.   Electronically Signed   By: Lorriane Shire M.D.   On: 04/17/2014 08:14   04/17/2014   CLINICAL DATA:  Respiratory failure.  EXAM: PORTABLE CHEST - 1 VIEW  COMPARISON:  04/16/2014 and 04/15/2014 and 04/13/2014  FINDINGS: Endotracheal tube is 5.5 cm above the carina. NG tube tip is in the body of the stomach. Heart size and vascularity are normal considering the shallow inspiration. There is slight consolidation at the left lung base medially. Minimal atelectasis at the right lung base due to the shallow inspiration.  IMPRESSION: Focal area of consolidation at the left lung base, unchanged.  Electronically Signed: By: Lorriane Shire M.D. On: 04/17/2014 07:09   Dg Chest Port 1 View  04/16/2014   CLINICAL DATA:  PICC placement  EXAM: PORTABLE CHEST - 1 VIEW  COMPARISON:  04/15/2014  FINDINGS: New left upper extremity PICC, tip in the region of the upper right atrium. An endotracheal tube tip remains at the clavicular heads. Gastric suction tube reaches the  stomach.  Unchanged cardiopericardial enlargement. Increased pulmonary venous congestion. The retrocardiac lung remains largely obscured by soft tissue attenuation. No effusion or pneumothorax.  IMPRESSION: 1. New left upper extremity PICC, tip at the upper right atrium. 2. Cardiomegaly and increased pulmonary venous congestion.   Electronically Signed   By: Monte Fantasia M.D.   On: 04/16/2014 16:46    Medications:  Prior to Admission:  Prescriptions prior to admission  Medication Sig Dispense Refill Last Dose  . insulin detemir (LEVEMIR) 100 UNIT/ML injection Inject 60 Units into the skin daily.     04/12/2014 at Unknown time  . metFORMIN (GLUCOPHAGE) 500 MG tablet Take 500 mg by mouth daily.     04/12/2014 at Unknown time  . PRESCRIPTION MEDICATION Blood pressure med awaiting [facility] clairfication      Scheduled: . antiseptic oral rinse  7 mL Mouth Rinse QID  . aspirin  81 mg Per Tube Daily  . chlorhexidine  15 mL Mouth Rinse BID  . Chlorhexidine Gluconate Cloth  6 each Topical Q0600  . feeding supplement (PRO-STAT SUGAR FREE 64)  30 mL Per Tube BID  . folic acid  1 mg Per Tube Daily  . insulin aspart  0-15 Units Subcutaneous 6 times per day  . [START ON 04/18/2014] insulin detemir  20 Units Subcutaneous Daily  . methylPREDNISolone (SOLU-MEDROL) injection  40 mg Intravenous Q24H  . multivitamin with minerals  1 tablet Per Tube Daily  . mupirocin ointment  1 application Nasal BID  . piperacillin-tazobactam (ZOSYN)  IV  3.375 g Intravenous Q8H  . sodium chloride  10-40 mL Intracatheter Q12H  . sodium chloride  3 mL Intravenous Q12H  . thiamine  100 mg Oral  Daily   Or  . thiamine  100 mg Intravenous Daily  . [START ON 04/19/2014] vancomycin  2,000 mg Intravenous Q48H   Continuous: . dexmedetomidine 0.6 mcg/kg/hr (04/17/14 1550)  . dexmedetomidine 0.6 mcg/kg/hr (04/17/14 1751)  . DOBUTamine 2.5 mcg/kg/min (04/17/14 1800)  . feeding supplement (VITAL AF 1.2 CAL) 1,000 mL (04/17/14 1700)  .  furosemide (LASIX) infusion 8 mg/hr (04/17/14 1800)  . heparin 1,000 Units/hr (04/17/14 1800)   QIO:NGEXBMWUX, diphenhydrAMINE, fentaNYL, fentaNYL, naphazoline, ondansetron **OR** ondansetron (ZOFRAN) IV, sodium chloride  Assesment: He has respiratory failure on the basis of angioedema, congestive heart failure, alcohol withdrawal. He is developing what appears to be multi-system problems with elevated liver function testing acute kidney injury. I think he has some element of vasculitis. He has a DVT. We can't tell for sure if he said pulmonary emboli because of his renal dysfunction and the inability to give him contrast with CT but he is being treated with heparin Principal Problem:   CHF exacerbation Active Problems:   Diabetes type 2, controlled   Angioedema   Tachycardia    Plan: Continue current treatments. He is on antibiotics and steroids dobutamine continuous infusion of Lasix heparin and Precedex    LOS: 4 days   HAWKINS,EDWARD L 04/17/2014, 6:59 PM

## 2014-04-17 NOTE — Progress Notes (Signed)
PICC line attempted to right arm. Needle inserted into vein and guidewire introduced. Resistance met and proceeded to remove guidewire. Guidewire did not want to advance or back out. Slowly advanced guidewire and pulled back gently. Guidewire started to back out and began unraveling. Immediately stopped pulling guidewire. Dr. Arnoldo Morale notified and was told to make a cut with scalpel and to pull wire. Small incision made at site and guidewire still did not want to pull out. Dr. Wyline Copas paged and  He came to room and inspected site with wire in. Dr. Wyline Copas called Dr. Arnoldo Morale and was told to make a deeper incision and pull a little harder. A small deeper incision made with scalpel and guidewire was pulled. We were not certain as to whether or not entire piece of wire came out. Dr. Arnoldo Morale informed us that if any piece was left it would probably be in the subcutaneous tissue which was tenting upon pulling of wire.. Dr. Arnoldo Morale stated that piece if any was probably the size of a splinter if any was left. Dr. Wyline Copas aware to keep eye on the site while under his care.

## 2014-04-17 NOTE — Progress Notes (Signed)
Consulting cardiologist: Prentice Docker  Primary Cardiologist: New  Cardiology Specific Problem List: 1. Mixed Systolic/Diastolic Dysfunction 2. DVT 3. Questionable Alcoholic CM 4. ACE induced angioedema   Subjective:    Intubated and sedated   Objective:   Temp:  [96 F (35.6 C)-97.6 F (36.4 C)] 97.5 F (36.4 C) (03/22 0400) Pulse Rate:  [37-115] 114 (03/22 0345) Resp:  [12-21] 15 (03/22 0345) BP: (62-161)/(43-107) 140/95 mmHg (03/22 0345) SpO2:  [85 %-100 %] 100 % (03/22 0345) FiO2 (%):  [40 %-50 %] 40 % (03/22 0355) Weight:  [304 lb 10.8 oz (138.2 kg)] 304 lb 10.8 oz (138.2 kg) (03/22 0500) Last BM Date: 04/13/14  Filed Weights   04/15/14 0500 04/16/14 0500 04/17/14 0500  Weight: 301 lb 2.4 oz (136.6 kg) 305 lb 12.5 oz (138.7 kg) 304 lb 10.8 oz (138.2 kg)    Intake/Output Summary (Last 24 hours) at 04/17/14 0745 Last data filed at 04/17/14 0500  Gross per 24 hour  Intake  971.3 ml  Output   2100 ml  Net -1128.7 ml    Telemetry: SR 119-125 bpm.  Exam:  General: Intubated and sedated  HEENT: Conjunctiva and lids normal, oropharynx clear.  Lungs: Diminished in the bases, worse on the left. No wheezes.   Cardiac: No elevated JVP or bruits. RRR, tachycardic, no gallop or rub.   Abdomen: Hypoactive to absent  bowel sounds, nontender, nondistended.OG with bloody discharge.  Extremities: Bilateral 2+ pitting edema, distal pulses diminished. Pain on the left with palpation. He arouses when this leg is palpated.   Neuropsychiatric: Arouses to pain.   Lab Results:  Basic Metabolic Panel:  Recent Labs Lab 04/15/14 0434 04/16/14 0900 04/17/14 0426  NA 133* 133* 134*  K 4.0 5.0 4.1  CL 99 99 99  CO2 22 22 20   GLUCOSE 79 154* 109*  BUN 47* 65* 77*  CREATININE 3.07* 4.26* 4.25*  CALCIUM 9.6 8.9 9.0  MG 1.8 1.7 2.1    Liver Function Tests:  Recent Labs Lab 04/14/14 0427 04/15/14 0434 04/16/14 0900  AST 52* 43* 97*  ALT 30 28 54*    ALKPHOS 95 91 81  BILITOT 1.8* 1.4* 1.8*  PROT 6.9 7.2 6.4  ALBUMIN 3.4* 3.7 3.1*    CBC:  Recent Labs Lab 04/16/14 0900 04/16/14 1006 04/17/14 0426  WBC 8.1 DUPLICATE 9.1  HGB 13.8 DUPLICATE 14.2  HCT 41.6 DUPLICATE 40.8  MCV 95.2 DUPLICATE 92.1  PLT 157 DUPLICATE 121*    Cardiac Enzymes:  Recent Labs Lab 04/13/14 1545 04/13/14 2224 04/14/14 0427  TROPONINI 0.08* 0.05* 0.04*    BNP: No results for input(s): PROBNP in the last 8760 hours.  Coagulation:  Recent Labs Lab 04/16/14 1006 04/17/14 0426  INR 1.59* 1.74*    Radiology: Ct Abdomen Pelvis Wo Contrast  04/15/2014   CLINICAL DATA:  Initial evaluation for abdominal pain with concern for renal obstruction or small bowel obstruction as well as ascites, personal history of diabetes and hypertension  EXAM: CT ABDOMEN AND PELVIS WITHOUT CONTRAST  TECHNIQUE: Multidetector CT imaging of the abdomen and pelvis was performed following the standard protocol without IV contrast.  COMPARISON:  04/14/2014  FINDINGS: There is a small volume of ascites in the abdomen and pelvis. Liver is normal as is the gallbladder. The spleen is normal. Pancreas appears normal. There are peripancreatic lymph nodes in the region of the portal vein. There is suggestion of mild inflammatory change around the pancreas. There is also possibly mild inflammatory change  inferior to the pancreas around the duodenum.  Adrenal glands are normal. Kidneys are normal. There are no renal or ureteral calculi. There is no evidence of hydronephrosis. There is moderate bilateral perinephric inflammatory change.  Reproductive organs are normal. Bladder is contracted and mildly hyper attenuating. There is mild inflammatory change around the bladder.  There is mild calcification of the abdominal aorta. There is a nonobstructive bowel gas pattern. There are no abnormally dilated loops of small or large bowel. Appendix appears nondilated. Stomach is normal.  There are no  acute musculoskeletal findings. There is coronary artery calcification. There is mild cardiac enlargement. There is trace left pleural fluid. There is a small right pleural effusion. There is mildly increased attenuation in the body wall suggesting possibility of mild anasarca.  IMPRESSION: Mild ascites in the abdomen and pelvis. Additionally, there is possible inflammatory change in the region of the pancreas that could indicate pancreatitis. There is also inflammatory change in the perinephric space bilaterally as well as around the bladder. Cystitis with pyelonephritis not excluded.  Bladder is essentially entirely decompressed and appears somewhat hyper attenuating. This may reflect concentrated urine. Correlate for any evidence of hematuria however.  Small pleural effusions   Electronically Signed   By: Esperanza Heiraymond  Rubner M.D.   On: 04/15/2014 18:04   Dg Chest 1 View  04/15/2014   CLINICAL DATA:  Endotracheal tube placement.  Initial encounter.  EXAM: CHEST  1 VIEW  COMPARISON:  Chest radiograph performed earlier today at 4:17 p.m.  FINDINGS: The patient's endotracheal tube is seen ending 4 cm above the carina.  The lungs are hypoexpanded. Vascular crowding and vascular congestion are seen. Mild left-sided opacity likely reflects atelectasis. No definite pleural effusion or pneumothorax is seen.  The cardiomediastinal silhouette is borderline enlarged. No acute osseous abnormalities are identified.  IMPRESSION: 1. Endotracheal tube seen ending 4 cm above the carina. 2. Lungs hypoexpanded. Vascular congestion and borderline cardiomegaly noted. Mild left-sided airspace opacity likely reflects atelectasis.   Electronically Signed   By: Roanna RaiderJeffery  Chang M.D.   On: 04/15/2014 20:19   [place] Venous Img Lower Bilateral  04/16/2014   CLINICAL DATA:  Bilateral leg and foot swelling for 2 weeks. Evaluate for DVT. The patient is currently on the ventilator.  EXAM: BILATERAL LOWER EXTREMITY VENOUS DOPPLER ULTRASOUND   TECHNIQUE: Gray-scale sonography with graded compression, as well as color Doppler and duplex ultrasound were performed to evaluate the lower extremity deep venous systems from the level of the common femoral vein and including the common femoral, femoral, profunda femoral, popliteal and calf veins including the posterior tibial, peroneal and gastrocnemius veins when visible. The superficial great saphenous vein was also interrogated. Spectral Doppler was utilized to evaluate flow at rest and with distal augmentation maneuvers in the common femoral, femoral and popliteal veins.  COMPARISON:  None.  FINDINGS: RIGHT LOWER EXTREMITY  Common Femoral Vein: No evidence of thrombus. Normal compressibility.  Saphenofemoral Junction: No evidence of thrombus. Normal compressibility.  Profunda Femoral Vein: No evidence of thrombus. Normal compressibility.  Femoral Vein: No evidence of thrombus.  Normal compressibility.  Popliteal Vein: No evidence of thrombus.  Normal compressibility.  Calf Veins: There is hypoechoic expansile occlusive thrombus within the posterior tibial vein (representative images 38 through 42).  Superficial Great Saphenous Vein: No evidence of thrombus. Normal compressibility and flow on color Doppler imaging.  Venous Reflux:  None.  Other Findings: A minimal on subcutaneous edema is noted at the level of the calf and lower leg.  LEFT LOWER EXTREMITY  Common Femoral Vein: No evidence of thrombus. Normal compressibility, respiratory phasicity and response to augmentation.  Saphenofemoral Junction: No evidence of thrombus. Normal compressibility.  Profunda Femoral Vein: No evidence of thrombus. Normal compressibility.  Femoral Vein: No evidence of thrombus.  Normal compressibility.  Popliteal Vein: No evidence of thrombus. Slow flow is demonstrated within the left popliteal vein (image 81) however the vessel remains normally compressible at this location (representative image 84).  Calf Veins: There is  hypoechoic nonocclusive thrombus within 1 of the paired left posterior tibial veins (image 99 through 101).  Superficial Great Saphenous Vein: No evidence of thrombus. Normal compressibility and flow on color Doppler imaging.  Venous Reflux:  None.  Other Findings: There is age-indeterminate occlusive superficial thrombophlebitis involving the lesser saphenous vein (Images 105 through portal 7). A minimal amount of subcutaneous edema is noted at the level of the left calf and ankle.  IMPRESSION: 1. Examination positive for DVT involving the right posterior tibial vein and one of the paired left posterior tibial veins, however there is no extension of this distal DVT to involve the more proximal deep venous systems of either lower extremity. 2. Examination is positive for age-indeterminate occlusive superficial thrombophlebitis involving the left lesser saphenous vein.   Electronically Signed   By: Simonne Come M.D.   On: 04/16/2014 14:12   Dg Chest Port 1 View  04/17/2014   CLINICAL DATA:  Respiratory failure.  EXAM: PORTABLE CHEST - 1 VIEW  COMPARISON:  04/16/2014 and 04/15/2014 and 04/13/2014  FINDINGS: Endotracheal tube is 5.5 cm above the carina. NG tube tip is in the body of the stomach. Heart size and vascularity are normal considering the shallow inspiration. There is slight consolidation at the left lung base medially. Minimal atelectasis at the right lung base due to the shallow inspiration.  IMPRESSION: Focal area of consolidation at the left lung base, unchanged.   Electronically Signed   By: Francene Boyers M.D.   On: 04/17/2014 07:09   Dg Chest Port 1 View  04/16/2014   CLINICAL DATA:  PICC placement  EXAM: PORTABLE CHEST - 1 VIEW  COMPARISON:  04/15/2014  FINDINGS: New left upper extremity PICC, tip in the region of the upper right atrium. An endotracheal tube tip remains at the clavicular heads. Gastric suction tube reaches the stomach.  Unchanged cardiopericardial enlargement. Increased pulmonary  venous congestion. The retrocardiac lung remains largely obscured by soft tissue attenuation. No effusion or pneumothorax.  IMPRESSION: 1. New left upper extremity PICC, tip at the upper right atrium. 2. Cardiomegaly and increased pulmonary venous congestion.   Electronically Signed   By: Marnee Spring M.D.   On: 04/16/2014 16:46   Dg Chest Port 1 View  04/15/2014   CLINICAL DATA:  CHF  EXAM: PORTABLE CHEST - 1 VIEW  COMPARISON:  04/13/2014  FINDINGS: Lungs are clear. No frank interstitial edema. No pleural effusion or pneumothorax.  Cardiomegaly.  IMPRESSION: No evidence of acute cardiopulmonary disease.   Electronically Signed   By: Charline Bills M.D.   On: 04/15/2014 16:46      Medications:   Scheduled Medications: . antiseptic oral rinse  7 mL Mouth Rinse QID  . aspirin  81 mg Per Tube Daily  . chlorhexidine  15 mL Mouth Rinse BID  . Chlorhexidine Gluconate Cloth  6 each Topical Q0600  . folic acid  1 mg Per Tube Daily  . insulin aspart  0-15 Units Subcutaneous 6 times per day  . insulin  detemir  15 Units Subcutaneous Daily  . LORazepam  0-4 mg Intravenous Q12H  . methylPREDNISolone (SOLU-MEDROL) injection  40 mg Intravenous Q24H  . multivitamin with minerals  1 tablet Per Tube Daily  . mupirocin ointment  1 application Nasal BID  . piperacillin-tazobactam (ZOSYN)  IV  3.375 g Intravenous Q8H  . sodium chloride  10-40 mL Intracatheter Q12H  . sodium chloride  3 mL Intravenous Q12H  . thiamine  100 mg Oral Daily   Or  . thiamine  100 mg Intravenous Daily    Infusions: . dexmedetomidine 0.6 mcg/kg/hr (04/16/14 1447)  . dexmedetomidine 0.6 mcg/kg/hr (04/17/14 0618)  . DOBUTamine 3 mcg/kg/min (04/16/14 1900)  . furosemide (LASIX) infusion 8 mg/hr (04/16/14 1900)  . heparin 1,600 Units/hr (04/17/14 0726)    PRN Medications: albuterol, diphenhydrAMINE, fentaNYL, fentaNYL, naphazoline, ondansetron **OR** ondansetron (ZOFRAN) IV, sodium chloride   Assessment and Plan:    1. Mixed Systolic/Diastolic Dysfunction: EF 20%. Now on dobutamine gtt, lasix gtt with minimal urine output. Edema of LE is worsening. Creatinine is essentially unchanged at 4.25. Suspect ETOH cardiomyopathy. He remains intubated.   2. Bilateral DVT's: Found via ultrasound yesterday. Now on heparin gtt. Some blood in oral gastric tube. He is being followed by pharmacy for dosing and labs. Unable to do VQ now to confirm PE.   3. ACE induced angioedema: ACE inhibitors have been added to his allergy list yesterday.   4. Renal Failure: Creatinine remains essentially unchanged. Oliguric. Nephrology is on board.   Bettey Mare. Lawrence NP [facility]  04/17/2014, 7:45 AM   The patient was seen and examined, and I agree with the assessment and plan as documented above, with modifications as noted below. Pt with late hypersensitivity reaction to ACEI, treated with steroids and removal of offending agent. Also with DT's. Has acute renal failure, unclear etiology at this time. Question whether he has a vasculitic process. Due to RV dysfunction and hypotension and inability to obtain a VQ scan, I ordered LE Dopplers on 3/21 which confirmed presence of DVT. He has been started on IV heparin. Will d/c ASA. Started dobutamine due to biventricular heart failure (possible alcoholic cardiomyopathy). Has had 1.8 L urine output. Instructed nurse to decrease to 2.5 mcg/kg/min to increase arrhythmia threshold. Had one 4-beat run of NSVT and frequent PVC's. Started Lasix infusion at 8 mg/hr on 3/21. He is on empiric vancomycin and Zosyn. Will continue present management for now. Time spent: 40 minutes critical care time.

## 2014-04-17 NOTE — Progress Notes (Signed)
INITIAL NUTRITION ASSESSMENT  DOCUMENTATION CODES Per approved criteria  -Obesity Unspecified   INTERVENTION: Initiate Vital 1.2 @ 20 ml/hr via OGT and increase by 10 ml every 4 hours to goal rate of 50 ml/hr.   Add 30 ml Prostat BID    Tube feeding regimen provides 1640 kcal, 120 grams of protein, and 1012 ml of H2O which will meet current energy and protein goal.    NUTRITION DIAGNOSIS: Inadequate oral intake related to inability to eat as evidenced by NOP status  Goal: Enteral nutrition to provide 60-70% of estimated calorie needs (22-25 kcals/kg ideal body weight) and 100% of estimated protein needs, based on ASPEN guidelines for hypocaloric, high protein feeding in critically ill obese individuals  Monitor:  Tolerance of enteral feeding, I/O's, weight  trend,  lab results  Reason for Assessment: consult for enteral intitiation and management  59 y.o. male  Admitting Dx: CHF exacerbation  ASSESSMENT: Pt presents with c/o edema 2 weeks prior to admission especially to lower extremities. His problems include acute diastolic CHF and EF 20-25%, hypertension, diabetes and ETOH abuse. Currently in renal failure. He is receiving IV lasix. Output noted. No BM since 3/18??  Procedure: central line was placed 3/20. Radiology:Lower extremity ultrasound: positive for DVT, moderate edema CT of abdomen and pelvis: mild ascites (abdomen and pelvis) and inflammatory change to area of pancreas.  Patient is currently intubated on ventilator support MV: 9.8 L/min Temp (24hrs), Avg:96.9 F (36.1 C), Min:96 F (35.6 C), Max:97.6 F (36.4 C)  Propofol: 0 ml/hr   Nutrition focucsed exam: no overt signs of muscles wasting   Height: Ht Readings from Last 1 Encounters:  04/17/14  (1.854 m)    Weight: Wt Readings from Last 1 Encounters:  04/17/14 304 lb 10.8 oz (138.2 kg)    Ideal Body Weight: 184# (83.6 kg)  % Ideal Body Weight: 167%  Wt Readings from Last 10 Encounters:   04/17/14 304 lb 10.8 oz (138.2 kg)  12/02/10 280 lb (127.007 kg)    Usual Body Weight: 280-290#  % Usual Body Weight:  105%  BMI:  Body mass index is 40.21 kg/(m^2). (skewed due to edema)  Estimated Nutritional Needs: Kcal: 1650 Protein: 125 gr Fluid: 1.5 liters daily   Skin: intact  Diet Order: Diet NPO time specified  EDUCATION NEEDS: -No education needs identified at this time   Intake/Output Summary (Last 24 hours) at 04/17/14 1055 Last data filed at 04/17/14 0912  Gross per 24 hour  Intake 1646.33 ml  Output   3700 ml  Net -2053.67 ml    Last BM: 3/18  Labs:   Recent Labs Lab 04/15/14 0434 04/16/14 0900 04/17/14 0426  NA 133* 133* 134*  K 4.0 5.0 4.1  CL 99 99 99  CO2 BUN 47* 65* 77*  CREATININE 3.07* 4.26* 4.25*  CALCIUM 9.6 8.9 9.0  MG 1.8 1.7 2.1  GLUCOSE 79 154* 109*    CBG (last 3)   Recent Labs  04/17/14 0020 04/17/14 0433 04/17/14 0725  GLUCAP 141* 102* 104*    Scheduled Meds: . antiseptic oral rinse  7 mL Mouth Rinse QID  . aspirin  81 mg Per Tube Daily  . chlorhexidine  15 mL Mouth Rinse BID  . Chlorhexidine Gluconate Cloth  6 each Topical Q0600  . folic acid  1 mg Per Tube Daily  . insulin aspart  0-15 Units Subcutaneous 6 times per day  . insulin detemir  15 Units Subcutaneous Daily  .  methylPREDNISolone (SOLU-MEDROL) injection  40 mg Intravenous Q24H  . multivitamin with minerals  1 tablet Per Tube Daily  . mupirocin ointment  1 application Nasal BID  . piperacillin-tazobactam (ZOSYN)  IV  3.375 g Intravenous Q8H  . sodium chloride  10-40 mL Intracatheter Q12H  . sodium chloride  3 mL Intravenous Q12H  . thiamine  100 mg Oral Daily   Or  . thiamine  100 mg Intravenous Daily  . vancomycin  2,000 mg Intravenous Once  . [START ON 04/19/2014] vancomycin  2,000 mg Intravenous Q48H    Continuous Infusions: . dexmedetomidine 0.6 mcg/kg/hr (04/17/14 0856)  . dexmedetomidine 0.6 mcg/kg/hr (04/17/14 0800)  .  DOBUTamine 2.5 mcg/kg/min (04/17/14 0900)  . furosemide (LASIX) infusion 8 mg/hr (04/17/14 0900)  . heparin 1,300 Units/hr (04/17/14 0900)    Past Medical History  Diagnosis Date  . Hypertension   . Diabetes mellitus     Past Surgical History  Procedure Laterality Date  . Hand surgery      Royann ShiversLynn Rhapsody Wolven MS,RD,CSG,LDN Office: #[phone] Pager: [phone]

## 2014-04-17 NOTE — Care Management Utilization Note (Signed)
UR completed 

## 2014-04-17 NOTE — Progress Notes (Signed)
ANTICOAGULATION CONSULT NOTE - follow up  Pharmacy Consult for Heparin Indication: DVT  Allergies  Allergen Reactions  . Ace Inhibitors Swelling  . Other Hives    wool   Patient Measurements: Height: 6\' 1"  (185.4 cm) Weight: (!) 304 lb 10.8 oz (138.2 kg) IBW/kg (Calculated) : 79.9 Heparin Dosing Weight: 111.4 kg  Vital Signs: Temp: 96.3 F (35.7 C) (03/22 1210) Temp Source: Axillary (03/22 1210) BP: 152/102 mmHg (03/22 1200) Pulse Rate: 121 (03/22 1200)  Labs:  Recent Labs  04/15/14 0434 04/16/14 0900 04/16/14 1006 04/16/14 2200 04/17/14 0426 04/17/14 1334  HGB 13.4 13.8 DUPLICATE  --  14.2  --   HCT 40.1 41.6 DUPLICATE  --  40.8  --   PLT 187 157 DUPLICATE  --  121*  --   APTT  --   --  31  --   --   --   LABPROT  --   --  19.1*  --  20.5*  --   INR  --   --  1.59*  --  1.74*  --   HEPARINUNFRC  --   --   --  0.93* 1.02* 1.04*  CREATININE 3.07* 4.26*  --   --  4.25*  --    Estimated Creatinine Clearance: 26.6 mL/min (by C-G formula based on Cr of 4.25).  Medical History: Past Medical History  Diagnosis Date  . Hypertension   . Diabetes mellitus    Medications:  Scheduled:  . antiseptic oral rinse  7 mL Mouth Rinse QID  . aspirin  81 mg Per Tube Daily  . chlorhexidine  15 mL Mouth Rinse BID  . Chlorhexidine Gluconate Cloth  6 each Topical Q0600  . feeding supplement (PRO-STAT SUGAR FREE 64)  30 mL Per Tube BID  . folic acid  1 mg Per Tube Daily  . insulin aspart  0-15 Units Subcutaneous 6 times per day  . insulin detemir  15 Units Subcutaneous Daily  . methylPREDNISolone (SOLU-MEDROL) injection  40 mg Intravenous Q24H  . multivitamin with minerals  1 tablet Per Tube Daily  . mupirocin ointment  1 application Nasal BID  . piperacillin-tazobactam (ZOSYN)  IV  3.375 g Intravenous Q8H  . sodium chloride  10-40 mL Intracatheter Q12H  . sodium chloride  3 mL Intravenous Q12H  . thiamine  100 mg Oral Daily   Or  . thiamine  100 mg Intravenous Daily  .  [START ON 04/19/2014] vancomycin  2,000 mg Intravenous Q48H   Assessment: Bilateral lower extremity venous doppler ultrasound positive for DVT involving the right posterior tibial vein and one of the paired left posterior tibial veins. Obese patient, reduced renal function Heparin level is above goal despite multiple rate reductions.  D/W nurse and lab was not drawn near site of heparin infusion.  CBC stable, PLTs trending down.  Goal of Therapy:  Heparin level 0.3-0.7 units/ml w/in 24 hrs of initiation Monitor platelets by anticoagulation protocol: Yes   Plan:  Reduce Heparin infusion to 1000 units/hr Recheck Heparin level in 6 hrs Heparin level and CBC daily while on Heparin  Margo AyeHall, Jaiden Dinkins A 04/17/2014,3:34 PM

## 2014-04-17 NOTE — Progress Notes (Signed)
Picc line was described to me by last nights nurse as still bleeding. At shift change bleeding had stopped. At 1700 skin started bleeding around PICC and doctor notified. Told to use pressure dressing and tape to stop bleeding. Did cover with folded 2x2 and clear covering. As of shift change bleeding appears to have stopped.

## 2014-04-17 NOTE — Progress Notes (Signed)
ANTICOAGULATION CONSULT NOTE - follow up  Pharmacy Consult for Heparin Indication: DVT  Allergies  Allergen Reactions  . Ace Inhibitors Swelling  . Other Hives    wool   Patient Measurements: Height: 6\' 1"  (185.4 cm) Weight: (!) 304 lb 10.8 oz (138.2 kg) IBW/kg (Calculated) : 79.9 Heparin Dosing Weight: 111.4 kg  Vital Signs: Temp: 97.4 F (36.3 C) (03/22 2000) Temp Source: Axillary (03/22 2000) BP: 134/80 mmHg (03/22 1900) Pulse Rate: 58 (03/22 1900)  Labs:  Recent Labs  04/15/14 0434 04/16/14 0900 04/16/14 1006  04/17/14 0426 04/17/14 1334 04/17/14 2100  HGB 13.4 13.8 DUPLICATE  --  14.2  --   --   HCT 40.1 41.6 DUPLICATE  --  40.8  --   --   PLT 187 157 DUPLICATE  --  121*  --   --   APTT  --   --  31  --   --   --   --   LABPROT  --   --  19.1*  --  20.5*  --   --   INR  --   --  1.59*  --  1.74*  --   --   HEPARINUNFRC  --   --   --   < > 1.02* 1.04* 1.10*  CREATININE 3.07* 4.26*  --   --  4.25*  --   --   < > = values in this interval not displayed. Estimated Creatinine Clearance: 26.6 mL/min (by C-G formula based on Cr of 4.25).  Medical History: Past Medical History  Diagnosis Date  . Hypertension   . Diabetes mellitus    Medications:  Scheduled:  . antiseptic oral rinse  7 mL Mouth Rinse QID  . aspirin  81 mg Per Tube Daily  . chlorhexidine  15 mL Mouth Rinse BID  . Chlorhexidine Gluconate Cloth  6 each Topical Q0600  . feeding supplement (PRO-STAT SUGAR FREE 64)  30 mL Per Tube BID  . folic acid  1 mg Per Tube Daily  . insulin aspart  0-15 Units Subcutaneous 6 times per day  . [START ON 04/18/2014] insulin detemir  20 Units Subcutaneous Daily  . methylPREDNISolone (SOLU-MEDROL) injection  40 mg Intravenous Q24H  . multivitamin with minerals  1 tablet Per Tube Daily  . mupirocin ointment  1 application Nasal BID  . piperacillin-tazobactam (ZOSYN)  IV  3.375 g Intravenous Q8H  . sodium chloride  10-40 mL Intracatheter Q12H  . sodium chloride  3  mL Intravenous Q12H  . thiamine  100 mg Oral Daily   Or  . thiamine  100 mg Intravenous Daily  . [START ON 04/19/2014] vancomycin  2,000 mg Intravenous Q48H   Assessment: Bilateral lower extremity venous doppler ultrasound positive for DVT involving the right posterior tibial vein and one of the paired left posterior tibial veins. Obese patient, reduced renal function Heparin level is above goal despite multiple rate reductions.  CBC stable, PLTs trending down this AM.  ATIII WNL.  Compromised liver function also noted.  Recent PICC line bleeding noted, resolved per recent RN note.  Goal of Therapy:  Heparin level 0.3-0.7 units/ml w/in 24 hrs of initiation Monitor platelets by anticoagulation protocol: Yes   Plan:  Stop Heparin drip x 1 hour. Reduce Heparin infusion to 800 units/hr Recheck Heparin level in 6 hrs (am labs) Heparin level and CBC daily while on Heparin  Mady GemmaHayes, Leonardo Plaia R 04/17/2014,9:45 PM

## 2014-04-17 NOTE — Progress Notes (Signed)
ANTIBIOTIC CONSULT NOTE - INITIAL  Pharmacy Consult for Zosyn & Vancomycin Indication: possible aspiration pneumonia, sepsis, bacteremia  Allergies  Allergen Reactions  . Ace Inhibitors Swelling  . Other Hives    wool   Patient Measurements: Height: 6\' 1"  (185.4 cm) Weight: (!) 304 lb 10.8 oz (138.2 kg) IBW/kg (Calculated) : 79.9  Vital Signs: Temp: 96.6 F (35.9 C) (03/22 0747) Temp Source: Axillary (03/22 0747) BP: 140/95 mmHg (03/22 0345) Pulse Rate: 114 (03/22 0345) Intake/Output from previous day: 03/21 0701 - 03/22 0700 In: 971.3 [I.V.:761.3; NG/GT:60; IV Piggyback:150] Out: 2100 [Urine:2100] Intake/Output from this shift:    Labs:  Recent Labs  04/15/14 0434 04/16/14 0900 04/16/14 1006 04/16/14 1805 04/17/14 0426  WBC 12.1* 8.1 DUPLICATE  --  9.1  HGB 13.4 13.8 DUPLICATE  --  14.2  PLT 187 157 DUPLICATE  --  121*  LABCREA  --   --   --  66.89  --   CREATININE 3.07* 4.26*  --   --  4.25*   Estimated Creatinine Clearance: 26.6 mL/min (by C-G formula based on Cr of 4.25). No results for input(s): VANCOTROUGH, VANCOPEAK, VANCORANDOM, GENTTROUGH, GENTPEAK, GENTRANDOM, TOBRATROUGH, TOBRAPEAK, TOBRARND, AMIKACINPEAK, AMIKACINTROU, AMIKACIN in the last 72 hours.   Microbiology: Recent Results (from the past 720 hour(s))  MRSA PCR Screening     Status: Abnormal   Collection Time: 04/13/14  4:00 PM  Result Value Ref Range Status   MRSA by PCR POSITIVE (A) NEGATIVE Final    Comment:        The GeneXpert MRSA Assay (FDA approved for NASAL specimens only), is one component of a comprehensive MRSA colonization surveillance program. It is not intended to diagnose MRSA infection nor to guide or monitor treatment for MRSA infections. RESULT CALLED TO, READ BACK BY AND VERIFIED WITH:  CUMMINGS,R @ 2205 ON 04/13/14 BY WOODIE,J   Culture, blood (x 2)     Status: None (Preliminary result)   Collection Time: 04/16/14 10:06 AM  Result Value Ref Range Status   Specimen Description BLOOD  Final   Special Requests NONE  Final   Culture NO GROWTH <24 HRS  Final   Report Status PENDING  Incomplete  Culture, blood (x 2)     Status: None (Preliminary result)   Collection Time: 04/16/14 10:16 AM  Result Value Ref Range Status   Specimen Description BLOOD  Final   Special Requests NONE  Final   Culture   Final    GRAM POSITIVE COCCI IN CHAINS Gram Stain Report Called to,Read Back By and Verified With: MISTY MCDANIEL RN ON [phone] AT 0700 BY RESSEGGER R    Report Status PENDING  Incomplete   Medical History: Past Medical History  Diagnosis Date  . Hypertension   . Diabetes mellitus    Zosyn 3/21 >> Vancomycin 3/22 >>  Assessment: 59yo male intubated in ICU.  Pt is morbidly obese.  Lactic acid elevated.  Pt with possible sepsis, aspiration pneumonia > Zosyn was started 3/21.  SCr is elevated, NClCr ~ 18-5324ml/min (borderline for Zosyn dose adjustment).  Blood cx (1/2) with reported GPC therefore asked to initiate Vancomycin.  Goal of Therapy:  Eradicate infection. Vancomycin trough level 15-20  Plan:  Continue Zosyn 3.375gm IV q8h, each dose over 4 hrs Vancomycin 2000mg  IV today x 1 then q48hrs (renally adjusted) Vancomycin trough level at steady state. Monitor labs, progress, renal fxn, and cultures  Valrie HartHall, Denita Lun A 04/17/2014,8:23 AM

## 2014-04-17 NOTE — Progress Notes (Signed)
ANTICOAGULATION CONSULT NOTE - follow up  Pharmacy Consult for Heparin Indication: DVT  Allergies  Allergen Reactions  . Ace Inhibitors Swelling  . Other Hives    wool   Patient Measurements: Height: 6\' 1"  (185.4 cm) Weight: (!) 304 lb 10.8 oz (138.2 kg) IBW/kg (Calculated) : 79.9 Heparin Dosing Weight: 111.4 kg  Vital Signs: Temp: 97.5 F (36.4 C) (03/22 0400) Temp Source: Axillary (03/22 0400) BP: 140/95 mmHg (03/22 0345) Pulse Rate: 114 (03/22 0345)  Labs:  Recent Labs  04/15/14 0434 04/16/14 0900 04/16/14 1006 04/16/14 2200 04/17/14 0426  HGB 13.4 13.8 DUPLICATE  --  14.2  HCT 40.1 41.6 DUPLICATE  --  40.8  PLT 187 157 DUPLICATE  --  121*  APTT  --   --  31  --   --   LABPROT  --   --  19.1*  --  20.5*  INR  --   --  1.59*  --  1.74*  HEPARINUNFRC  --   --   --  0.93* 1.02*  CREATININE 3.07* 4.26*  --   --  4.25*   Estimated Creatinine Clearance: 26.6 mL/min (by C-G formula based on Cr of 4.25).  Medical History: Past Medical History  Diagnosis Date  . Hypertension   . Diabetes mellitus    Medications:  Scheduled:  . antiseptic oral rinse  7 mL Mouth Rinse QID  . aspirin  81 mg Per Tube Daily  . chlorhexidine  15 mL Mouth Rinse BID  . Chlorhexidine Gluconate Cloth  6 each Topical Q0600  . folic acid  1 mg Per Tube Daily  . insulin aspart  0-15 Units Subcutaneous 6 times per day  . insulin detemir  15 Units Subcutaneous Daily  . LORazepam  0-4 mg Intravenous Q12H  . methylPREDNISolone (SOLU-MEDROL) injection  40 mg Intravenous Q24H  . multivitamin with minerals  1 tablet Per Tube Daily  . mupirocin ointment  1 application Nasal BID  . piperacillin-tazobactam (ZOSYN)  IV  3.375 g Intravenous Q8H  . sodium chloride  10-40 mL Intracatheter Q12H  . sodium chloride  3 mL Intravenous Q12H  . thiamine  100 mg Oral Daily   Or  . thiamine  100 mg Intravenous Daily   Assessment: Bilateral lower extremity venous doppler ultrasound positive for DVT  involving the right posterior tibial vein and one of the paired left posterior tibial veins. Obese patient, reduced renal function Heparin level is above goal.  CBC stable, PLTs trending down.  Goal of Therapy:  Heparin level 0.3-0.7 units/ml w/in 24 hrs of initiation Monitor platelets by anticoagulation protocol: Yes   Plan:  Reduce Heparin infusion to 1300 units/hr Recheck Heparin level in 6 hrs Heparin level and CBC daily while on Heparin  Margo AyeHall, Anderson Middlebrooks A 04/17/2014,7:31 AM

## 2014-04-17 NOTE — Progress Notes (Addendum)
[facility] PROGRESS NOTE  Gabriel Reynolds OZH:[id number] DOB: 1955/03/18 DOA: 04/13/2014 PCP: Pcp Not In System  Off Service Summary: 59yo with a hx of ETOH abuse initially presented to the ed with complaints of facial and LE swelling. Facial swelling was concerning for angioedema in the setting of ACEI use and LE edema was concerning for new CHF. The patient was admitted. Cardiology consulted. 2d echo was notable for new EF of 20-25% with grade 3 diastolic dysfunction and abd [place] was notable for new cirrhosis, both likely secondary to ETOH abuse. The patient was started on IV lasix however he made little urine with Cr trending up. Patient then became increasingly confused with increased tachypnea and hyperventillation. Patient was intubated for airway protection. Patient later found to have new LE DVT and heparin gtt started. IV lasix was transitioned to lasix gtt and Nephrology consulted for management of ARF. Thus far, the patient's urine output has steadily improved, albeit on lasix gtt. Of note, the patient was started on zosyn for aspiration pna coverage given difficult intubation and vancomycin for 1/2 gm pos organisms in blood cultures.  Assessment/Plan: 1. Acutely decompensated combined systolic and diastolic CHF exacerbation 1. Elevated BNP with enlarged heart on CXR on admit 2. Cardiology following 3. 2d echo done 3/19. Findings of EF 20-25% with grade 3 diastolic dysfunction, diffuse hypokinesis, suspected secondary to ETOH cardiomyopathy 4. Initially no urine output despite IV lasix TID, now on lasix gtt per Cardiology 5. Over 2.1 L out over past 24hrs and 1650 cc thus far today 6. On dobutamine gtt for biventricular heart disease 2. DM2 1. As pt is intubated, requested Nutrition for tube feeds 2. On SSI coverage 3. Continue on Levemir at 20 units 3. Angioedema 1. Suspect related to lisinopril, now on hold and ACEI listed as allergy 2. Continue steroids and  benadryl 4. Tachycardia 1. Cardiology following 2. Rate better controlled currently 3. Noted to have bouts of NSVT 4. Ultimately would benefit from metoprolol succinate or carvedilol 5. ETOH abuse 1. Pt downplayed his ETOH intake, but it is reportedly significant 2. Wife at bedside reported as much as 3x "40's" of beer in one day 3. Pt with evidence of cirrhosis per below 4. On evening of 3/20, pt became unstable and unable to maintain airway, thus intubated with concerns of acute ETOH withdrawal 6. Cirrhosis, likely alcoholic 1. Evidence of cirrhosis on liver US 2. LFT's trending down 3. Avoid hepatotoxic agents 7. LE DVT 1. Noted on LE dopplers 2. Also concerns for possible PE as well, but unable to obtain VQ or CT w/ contrast 3. Have started therapeutic heparin gtt 8. Possible aspiration pneumonia and possible gram pos bacteremia with sepsis not present on admt 1. Elevated lactic acid with hypotension, hypothermia of 96.79F 2. Pan cultures are thus far unremarkable 3. Given recent difficult intubation, covering for aspiration PNA empirically with zosyn 4. Blood cultures pos for Gm POS in cocci in pairs. Vanc started 5. Follow lactate trends. Has normalized 6. Not giving IVF secondary to pt being clinically volume overloaded  Code Status: Full Family Communication: Pt in room, family at bedside Disposition Plan: Pending   Consultants:  Cardiology  Procedures:    Antibiotics:  Zosyn 3/21>>>  Vancomycin 3/22>>>  HPI/Subjective: Remains intubated. No events noted  Objective: Filed Vitals:   04/17/14 0700 04/17/14 0747 04/17/14 0800 04/17/14 0838  BP: 147/92  149/103   Pulse: 117  117   Temp:  96.6 F (35.9 C)    TempSrc:  Axillary    Resp: 15  14   Height:     (1.854 m)  Weight:      SpO2: 99%  99%     Intake/Output Summary (Last 24 hours) at 04/17/14 0900 Last data filed at 04/17/14 0500  Gross per 24 hour  Intake 951.24 ml  Output   2100 ml   Net -1148.76 ml   Filed Weights   04/15/14 0500 04/16/14 0500 04/17/14 0500  Weight: 136.6 kg (301 lb 2.4 oz) 138.7 kg (305 lb 12.5 oz) 138.2 kg (304 lb 10.8 oz)    Exam:   General:  Intubated, sedated  Cardiovascular: regular, s1, s2  Respiratory: Intubated, no wheezing  Abdomen: soft, obese, nondistended  Musculoskeletal: perfused, no clubbing   Data Reviewed: Basic Metabolic Panel:  Recent Labs Lab 04/13/14 1110 04/14/14 0427 04/15/14 0434 04/16/14 0900 04/17/14 0426  NA 135 134* 133* 133* 134*  K 3.7 4.4 4.0 5.0 4.1  CL 99 99 99 99 99  CO2 GLUCOSE 95 223* 79 154* 109*  BUN 29* 37* 47* 65* 77*  CREATININE 1.98* 2.33* 3.07* 4.26* 4.25*  CALCIUM 9.2 9.1 9.6 8.9 9.0  MG  --   --  1.8 1.7 2.1   Liver Function Tests:  Recent Labs Lab 04/13/14 1545 04/14/14 0427 04/15/14 0434 04/16/14 0900  AST 61* 52* 43* 97*  ALT 54*  ALKPHOS 97 95 91 81  BILITOT 2.1* 1.8* 1.4* 1.8*  PROT 7.2 6.9 7.2 6.4  ALBUMIN 3.7 3.4* 3.7 3.1*    Recent Labs Lab 04/15/14 2000  LIPASE 25   No results for input(s): AMMONIA in the last 168 hours. CBC:  Recent Labs Lab 04/13/14 1110 04/14/14 0427 04/15/14 0434 04/16/14 0900 04/16/14 1006 04/17/14 0426  WBC 6.7 4.9 12.1* 8.1 DUPLICATE 9.1  NEUTROABS 4.3  --   --   --  6.4  --   HGB 13.2 13.1 13.4 13.8 DUPLICATE 14.2  HCT 38.9* 38.6* 40.1 41.6 DUPLICATE 40.8  MCV 94.0 94.4 94.4 95.2 DUPLICATE 92.1  PLT 151 150 187 157 DUPLICATE 121*   Cardiac Enzymes:  Recent Labs Lab 04/13/14 1110 04/13/14 1545 04/13/14 2224 04/14/14 0427  TROPONINI 0.09* 0.08* 0.05* 0.04*   BNP (last 3 results)  Recent Labs  04/13/14 1110  BNP 617.0*    ProBNP (last 3 results) No results for input(s): PROBNP in the last 8760 hours.  CBG:  Recent Labs Lab 04/16/14 1646 04/16/14 1930 04/17/14 0020 04/17/14 0433 04/17/14 0725  GLUCAP 149* 167* 141* 102* 104*    Recent Results (from the past 240  hour(s))  MRSA PCR Screening     Status: Abnormal   Collection Time: 04/13/14  4:00 PM  Result Value Ref Range Status   MRSA by PCR POSITIVE (A) NEGATIVE Final    Comment:        The GeneXpert MRSA Assay (FDA approved for NASAL specimens only), is one component of a comprehensive MRSA colonization surveillance program. It is not intended to diagnose MRSA infection nor to guide or monitor treatment for MRSA infections. RESULT CALLED TO, READ BACK BY AND VERIFIED WITH:  CUMMINGS,R @ 2205 ON 04/13/14 BY WOODIE,J   Culture, blood (x 2)     Status: None (Preliminary result)   Collection Time: 04/16/14 10:06 AM  Result Value Ref Range Status   Specimen Description BLOOD  Final   Special Requests NONE  Final   Culture NO GROWTH <24 HRS  Final   Report Status PENDING  Incomplete  Culture, blood (x 2)     Status: None (Preliminary result)   Collection Time: 04/16/14 10:16 AM  Result Value Ref Range Status   Specimen Description BLOOD  Final   Special Requests NONE  Final   Culture   Final    GRAM POSITIVE COCCI IN CHAINS Gram Stain Report Called to,Read Back By and Verified With: MISTY MCDANIEL RN ON [id number] AT 0700 BY RESSEGGER R    Report Status PENDING  Incomplete     Studies: Ct Abdomen Pelvis Wo Contrast  04/15/2014   CLINICAL DATA:  Initial evaluation for abdominal pain with concern for renal obstruction or small bowel obstruction as well as ascites, personal history of diabetes and hypertension  EXAM: CT ABDOMEN AND PELVIS WITHOUT CONTRAST  TECHNIQUE: Multidetector CT imaging of the abdomen and pelvis was performed following the standard protocol without IV contrast.  COMPARISON:  04/14/2014  FINDINGS: There is a small volume of ascites in the abdomen and pelvis. Liver is normal as is the gallbladder. The spleen is normal. Pancreas appears normal. There are peripancreatic lymph nodes in the region of the portal vein. There is suggestion of mild inflammatory change around the  pancreas. There is also possibly mild inflammatory change inferior to the pancreas around the duodenum.  Adrenal glands are normal. Kidneys are normal. There are no renal or ureteral calculi. There is no evidence of hydronephrosis. There is moderate bilateral perinephric inflammatory change.  Reproductive organs are normal. Bladder is contracted and mildly hyper attenuating. There is mild inflammatory change around the bladder.  There is mild calcification of the abdominal aorta. There is a nonobstructive bowel gas pattern. There are no abnormally dilated loops of small or large bowel. Appendix appears nondilated. Stomach is normal.  There are no acute musculoskeletal findings. There is coronary artery calcification. There is mild cardiac enlargement. There is trace left pleural fluid. There is a small right pleural effusion. There is mildly increased attenuation in the body wall suggesting possibility of mild anasarca.  IMPRESSION: Mild ascites in the abdomen and pelvis. Additionally, there is possible inflammatory change in the region of the pancreas that could indicate pancreatitis. There is also inflammatory change in the perinephric space bilaterally as well as around the bladder. Cystitis with pyelonephritis not excluded.  Bladder is essentially entirely decompressed and appears somewhat hyper attenuating. This may reflect concentrated urine. Correlate for any evidence of hematuria however.  Small pleural effusions   Electronically Signed   By: Esperanza Heir M.D.   On: 04/15/2014 18:04   Dg Chest 1 View  04/15/2014   CLINICAL DATA:  Endotracheal tube placement.  Initial encounter.  EXAM: CHEST  1 VIEW  COMPARISON:  Chest radiograph performed earlier today at 4:17 p.m.  FINDINGS: The patient's endotracheal tube is seen ending 4 cm above the carina.  The lungs are hypoexpanded. Vascular crowding and vascular congestion are seen. Mild left-sided opacity likely reflects atelectasis. No definite pleural  effusion or pneumothorax is seen.  The cardiomediastinal silhouette is borderline enlarged. No acute osseous abnormalities are identified.  IMPRESSION: 1. Endotracheal tube seen ending 4 cm above the carina. 2. Lungs hypoexpanded. Vascular congestion and borderline cardiomegaly noted. Mild left-sided airspace opacity likely reflects atelectasis.   Electronically Signed   By: Roanna Raider M.D.   On: 04/15/2014 20:19   US Venous Img Lower Bilateral  04/16/2014   CLINICAL DATA:  Bilateral leg and foot swelling for 2 weeks. Evaluate for DVT.  The patient is currently on the ventilator.  EXAM: BILATERAL LOWER EXTREMITY VENOUS DOPPLER ULTRASOUND  TECHNIQUE: Gray-scale sonography with graded compression, as well as color Doppler and duplex ultrasound were performed to evaluate the lower extremity deep venous systems from the level of the common femoral vein and including the common femoral, femoral, profunda femoral, popliteal and calf veins including the posterior tibial, peroneal and gastrocnemius veins when visible. The superficial great saphenous vein was also interrogated. Spectral Doppler was utilized to evaluate flow at rest and with distal augmentation maneuvers in the common femoral, femoral and popliteal veins.  COMPARISON:  None.  FINDINGS: RIGHT LOWER EXTREMITY  Common Femoral Vein: No evidence of thrombus. Normal compressibility.  Saphenofemoral Junction: No evidence of thrombus. Normal compressibility.  Profunda Femoral Vein: No evidence of thrombus. Normal compressibility.  Femoral Vein: No evidence of thrombus.  Normal compressibility.  Popliteal Vein: No evidence of thrombus.  Normal compressibility.  Calf Veins: There is hypoechoic expansile occlusive thrombus within the posterior tibial vein (representative images 38 through 42).  Superficial Great Saphenous Vein: No evidence of thrombus. Normal compressibility and flow on color Doppler imaging.  Venous Reflux:  None.  Other Findings: A minimal on  subcutaneous edema is noted at the level of the calf and lower leg.  LEFT LOWER EXTREMITY  Common Femoral Vein: No evidence of thrombus. Normal compressibility, respiratory phasicity and response to augmentation.  Saphenofemoral Junction: No evidence of thrombus. Normal compressibility.  Profunda Femoral Vein: No evidence of thrombus. Normal compressibility.  Femoral Vein: No evidence of thrombus.  Normal compressibility.  Popliteal Vein: No evidence of thrombus. Slow flow is demonstrated within the left popliteal vein (image 81) however the vessel remains normally compressible at this location (representative image 84).  Calf Veins: There is hypoechoic nonocclusive thrombus within 1 of the paired left posterior tibial veins (image 99 through 101).  Superficial Great Saphenous Vein: No evidence of thrombus. Normal compressibility and flow on color Doppler imaging.  Venous Reflux:  None.  Other Findings: There is age-indeterminate occlusive superficial thrombophlebitis involving the lesser saphenous vein (Images 105 through portal 7). A minimal amount of subcutaneous edema is noted at the level of the left calf and ankle.  IMPRESSION: 1. Examination positive for DVT involving the right posterior tibial vein and one of the paired left posterior tibial veins, however there is no extension of this distal DVT to involve the more proximal deep venous systems of either lower extremity. 2. Examination is positive for age-indeterminate occlusive superficial thrombophlebitis involving the left lesser saphenous vein.   Electronically Signed   By: Simonne ComeJohn  Watts M.D.   On: 04/16/2014 14:12   Dg Chest Port 1 View  04/17/2014   ADDENDUM REPORT: 04/17/2014 08:14  ADDENDUM: PICC tip is in the superior vena cava at the level of the azygos vein, retracted since the prior study.   Electronically Signed   By: Francene BoyersJames  Maxwell M.D.   On: 04/17/2014 08:14   04/17/2014   CLINICAL DATA:  Respiratory failure.  EXAM: PORTABLE CHEST - 1 VIEW   COMPARISON:  04/16/2014 and 04/15/2014 and 04/13/2014  FINDINGS: Endotracheal tube is 5.5 cm above the carina. NG tube tip is in the body of the stomach. Heart size and vascularity are normal considering the shallow inspiration. There is slight consolidation at the left lung base medially. Minimal atelectasis at the right lung base due to the shallow inspiration.  IMPRESSION: Focal area of consolidation at the left lung base, unchanged.  Electronically Signed: By: Fayrene FearingJames  Maxwell M.D. On: 04/17/2014 07:09   Dg Chest Port 1 View  04/16/2014   CLINICAL DATA:  PICC placement  EXAM: PORTABLE CHEST - 1 VIEW  COMPARISON:  04/15/2014  FINDINGS: New left upper extremity PICC, tip in the region of the upper right atrium. An endotracheal tube tip remains at the clavicular heads. Gastric suction tube reaches the stomach.  Unchanged cardiopericardial enlargement. Increased pulmonary venous congestion. The retrocardiac lung remains largely obscured by soft tissue attenuation. No effusion or pneumothorax.  IMPRESSION: 1. New left upper extremity PICC, tip at the upper right atrium. 2. Cardiomegaly and increased pulmonary venous congestion.   Electronically Signed   By: Marnee Spring M.D.   On: 04/16/2014 16:46   Dg Chest Port 1 View  04/15/2014   CLINICAL DATA:  CHF  EXAM: PORTABLE CHEST - 1 VIEW  COMPARISON:  04/13/2014  FINDINGS: Lungs are clear. No frank interstitial edema. No pleural effusion or pneumothorax.  Cardiomegaly.  IMPRESSION: No evidence of acute cardiopulmonary disease.   Electronically Signed   By: Charline Bills M.D.   On: 04/15/2014 16:46    Scheduled Meds: . antiseptic oral rinse  7 mL Mouth Rinse QID  . aspirin  81 mg Per Tube Daily  . chlorhexidine  15 mL Mouth Rinse BID  . Chlorhexidine Gluconate Cloth  6 each Topical Q0600  . folic acid  1 mg Per Tube Daily  . insulin aspart  0-15 Units Subcutaneous 6 times per day  . insulin detemir  15 Units Subcutaneous Daily  . LORazepam  0-4 mg  Intravenous Q12H  . methylPREDNISolone (SOLU-MEDROL) injection  40 mg Intravenous Q24H  . multivitamin with minerals  1 tablet Per Tube Daily  . mupirocin ointment  1 application Nasal BID  . pantoprazole (PROTONIX) IV  40 mg Intravenous Once  . piperacillin-tazobactam (ZOSYN)  IV  3.375 g Intravenous Q8H  . sodium chloride  10-40 mL Intracatheter Q12H  . sodium chloride  3 mL Intravenous Q12H  . thiamine  100 mg Oral Daily   Or  . thiamine  100 mg Intravenous Daily  . vancomycin  2,000 mg Intravenous Once  . [START ON 04/19/2014] vancomycin  2,000 mg Intravenous Q48H   Continuous Infusions: . dexmedetomidine 0.6 mcg/kg/hr (04/17/14 0856)  . dexmedetomidine 0.6 mcg/kg/hr (04/17/14 0800)  . DOBUTamine 3 mcg/kg/min (04/16/14 1900)  . furosemide (LASIX) infusion 8 mg/hr (04/16/14 1900)  . heparin 1,300 Units/hr (04/17/14 0815)    Principal Problem:   CHF exacerbation Active Problems:   Diabetes type 2, controlled   Angioedema   Tachycardia    CHIU, STEPHEN K  [facility] Pager [phone]. If 7PM-7AM, please contact night-coverage at [email or web address], password [facility] 04/17/2014, 9:00 AM  LOS: 4 days

## 2014-04-17 NOTE — Care Management Note (Addendum)
    Page 1 of 2   04/30/2014     12:26:26 PM CARE MANAGEMENT NOTE 04/30/2014  Patient:  Gabriel Reynolds   Account Number:  [id number]  Date Initiated:  04/17/2014  Documentation initiated by:  CHILDRESS,JESSICA  Subjective/Objective Assessment:   Pt from home with wife. Pt currently intubated and unable to speak. Pt critically ill. Will cont to follow for assessment once medically improved.     Action/Plan:   Anticipated DC Date:  04/22/2014   Anticipated DC Plan:  HOME/SELF CARE      DC Planning Services  CM consult      Choice offered to / List presented to:             Status of service:  Completed, signed off Medicare Important Message given?  YES (If response is "NO", the following Medicare IM given date fields will be blank) Date Medicare IM given:  04/20/2014 Medicare IM given by:  Kathyrn SheriffHILDRESS,JESSICA Date Additional Medicare IM given:  04/30/2014 Additional Medicare IM given by:  Sharrie RothmanAMMY C Sahory Nordling  Discharge Disposition:  SKILLED NURSING FACILITY  Per UR Regulation:  Reviewed for med. necessity/level of care/duration of stay  If discussed at Long Length of Stay Meetings, dates discussed:   04/19/2014  04/24/2014  04/26/2014    Comments:  04/30/14 1220 Arlyss Queenammy Otniel Hoe, RN BSN CM Pt to [facility]. CSW to arrange discharge to facility.  04/27/14 1320 Arlyss Queenammy Manaia Samad, RN BSN CM Pts heart rate still not controlled with po medications. Cardiology still adjusting medications. Anticipate [facility] over the weekend once heart rate more controlled. CSW to arrange discharge to facility.  04/23/2014 1100 Kathyrn SheriffJessica Childress, RN, MSN, CM Pt extrubed over weekend. [facility] Jacki ConesLaurie, transfer coordinator, notified of admission and will tell the pt's clinic MD. Faxed refusal to transfer form, H&P and most recent progress notes to [place]. Pt is from home, has no HH services or DME's, pt previously indpendent at baseline. Will cont to  follow.  04/20/2014 1300 Kathyrn SheriffJessica Childress, RN, MSN, CM 04/19/2014 1300 Kathyrn SheriffJessica Childress, RN, MSN, CM 04/17/2014 1400 Kathyrn SheriffJessica Childress, RN, MSN, CM

## 2014-04-17 NOTE — Consult Note (Signed)
NAMCline Crock:  [place], Rumi                 ACCOUNT NO.:  [id number]  MEDICAL RECORD NO.:  [id number]  LOCATION:  [facility]05                          FACILITY:  [facility]  PHYSICIAN:  Chalee Hirota L. Juanetta GoslingHawkins, M.D.DATE OF BIRTH:  March 03, 1955  DATE OF CONSULTATION: DATE OF DISCHARGE:                                CONSULTATION   REASON FOR CONSULTATION:  Respiratory failure.  HISTORY:  This is a 59 year old, who came to the emergency department because of swelling.  He has diabetes.  He was noted to have generalized edema and more prominent facial swelling in the setting of lisinopril use.  He was noted to have enlarged heart on chest x-ray.  BNP was 617. He was tachycardic.  He was admitted for all of this and originally did well, then started developing increased problems with shortness of breath and eventually had to be intubated.  He was thought to be in DTs at that time.  He was a difficult intubation because of his angioedema. He is now on the ventilator.  He is known to have heart failure with an ejection fraction of about 20-25%.  He is on pressors.  He has not been responding to IV Lasix and has had worsening renal function.  He is also known to have diastolic dysfunction of the heart with a restrictive pattern.  Consultation is requested regarding his ventilator management and respiratory failure.  PAST MEDICAL HISTORY:  Positive for hypertension and diabetes.  PAST SURGICAL HISTORY:  Surgically, he has had hand surgery.  SOCIAL HISTORY:  He is an apparently lifelong nonsmoker.  He drinks about 4.2 ounces of alcohol per week with no illicit drugs.  REVIEW OF SYSTEMS:  Not obtainable.  The history is obtained from the medical record because he is not able to provide any information.  FAMILY HISTORY:  Positive for diabetes in his mother.  At home, he had listed insulin 60 units daily and metformin 500 mg daily.  PHYSICAL EXAMINATION:  GENERAL:  Now shows that he is intubated, sedated and on the  ventilator. VITAL SIGNS:  His heart rate is running around 110.  Respirations running around 20 depending on his level of sedation.  He is afebrile. Blood pressure when I saw him was about 70 and he had been trying to be weaned off his pressors and pressors have been reinstituted.  Oxygen saturation is mostly running in the 100% range now. HEENT:  His pupils are reactive.  Nose and throat are clear.  Mucous membranes are moist.  He still has swelling of his tongue. NECK:  Supple. CHEST:  Shows decreased breath sounds and some rhonchi. HEART:  Regular with tachycardia. ABDOMEN:  Soft without masses. EXTREMITIES:  He has edema of the lower extremities.  His chest x-ray after intubation showed the endotracheal tube was in good position.  He had vascular congestion and cardiomegaly.  Some left- sided airspace opacity, which was thought to be atelectasis.  Blood gas after intubation shows pH of 7.348, pCO2 of 31.8 and PO2 of 134.  His creatinine is 4.26 with a BUN of 65.  Potassium is 5.  Albumin 3.1.  His AST was 97, ALT 54, bilirubin 1.8.  Lactate  was 3.2 this morning. Procalcitonin 1.32.  White blood count 8100, hemoglobin 13.8, platelets 157.  His echocardiogram shows poor left ventricular systolic function and diastolic dysfunction with something of a restrictive pattern.  ASSESSMENT:  My assessment then is that he has respiratory failure, multifactorial.  His lactate is up suggesting that he is septic.  He has delirium tremens.  He has heart failure.  He has acute renal failure and his renal function has not improved and he is not making a lot of urine. Assessment then is that he has multiple medical problems.  He is acutely sick with multisystem failure.  I told his family who were present at bedside considering his problems with swelling that he would not be a candidate for [place] to work toward early extubation.  He is going to need to be followed closely and see how he does before I  attempt extubation and I would like to be sure that his angioedema has largely resolved, although that can be recurrent.     Shantae Vantol L. Juanetta Gosling, M.D.     ELH/MEDQ  D:  04/16/2014  T:  04/17/2014  Job:  [id number]

## 2014-04-18 ENCOUNTER — Inpatient Hospital Stay (HOSPITAL_COMMUNITY): Payer: Medicare Other

## 2014-04-18 DIAGNOSIS — I472 Ventricular tachycardia: Secondary | ICD-10-CM

## 2014-04-18 DIAGNOSIS — J9601 Acute respiratory failure with hypoxia: Secondary | ICD-10-CM

## 2014-04-18 DIAGNOSIS — T783XXD Angioneurotic edema, subsequent encounter: Secondary | ICD-10-CM

## 2014-04-18 DIAGNOSIS — A409 Streptococcal sepsis, unspecified: Secondary | ICD-10-CM

## 2014-04-18 DIAGNOSIS — Z9911 Dependence on respirator [ventilator] status: Secondary | ICD-10-CM

## 2014-04-18 DIAGNOSIS — I82403 Acute embolism and thrombosis of unspecified deep veins of lower extremity, bilateral: Secondary | ICD-10-CM

## 2014-04-18 DIAGNOSIS — I4891 Unspecified atrial fibrillation: Secondary | ICD-10-CM

## 2014-04-18 DIAGNOSIS — A419 Sepsis, unspecified organism: Secondary | ICD-10-CM

## 2014-04-18 DIAGNOSIS — N179 Acute kidney failure, unspecified: Secondary | ICD-10-CM

## 2014-04-18 DIAGNOSIS — I426 Alcoholic cardiomyopathy: Secondary | ICD-10-CM

## 2014-04-18 DIAGNOSIS — I82409 Acute embolism and thrombosis of unspecified deep veins of unspecified lower extremity: Secondary | ICD-10-CM | POA: Insufficient documentation

## 2014-04-18 DIAGNOSIS — I519 Heart disease, unspecified: Secondary | ICD-10-CM

## 2014-04-18 DIAGNOSIS — R6520 Severe sepsis without septic shock: Secondary | ICD-10-CM

## 2014-04-18 DIAGNOSIS — T783XXS Angioneurotic edema, sequela: Secondary | ICD-10-CM

## 2014-04-18 LAB — CBC WITH DIFFERENTIAL/PLATELET
Basophils Absolute: 0 10*3/uL (ref 0.0–0.1)
Basophils Relative: 0 % (ref 0–1)
Eosinophils Absolute: 0 10*3/uL (ref 0.0–0.7)
Eosinophils Relative: 0 % (ref 0–5)
HEMATOCRIT: 37.5 % — AB (ref 39.0–52.0)
Hemoglobin: 13 g/dL (ref 13.0–17.0)
LYMPHS ABS: 0.7 10*3/uL (ref 0.7–4.0)
Lymphocytes Relative: 9 % — ABNORMAL LOW (ref 12–46)
MCH: 31.9 pg (ref 26.0–34.0)
MCHC: 34.7 g/dL (ref 30.0–36.0)
MCV: 91.9 fL (ref 78.0–100.0)
MONOS PCT: 8 % (ref 3–12)
Monocytes Absolute: 0.6 10*3/uL (ref 0.1–1.0)
NEUTROS ABS: 6.4 10*3/uL (ref 1.7–7.7)
Neutrophils Relative %: 83 % — ABNORMAL HIGH (ref 43–77)
PLATELETS: 125 10*3/uL — AB (ref 150–400)
RBC: 4.08 MIL/uL — ABNORMAL LOW (ref 4.22–5.81)
RDW: 13.1 % (ref 11.5–15.5)
WBC: 7.7 10*3/uL (ref 4.0–10.5)

## 2014-04-18 LAB — BASIC METABOLIC PANEL
Anion gap: 14 (ref 5–15)
BUN: 84 mg/dL — AB (ref 6–23)
CO2: 23 mmol/L (ref 19–32)
Calcium: 8.7 mg/dL (ref 8.4–10.5)
Chloride: 96 mmol/L (ref 96–112)
Creatinine, Ser: 3.46 mg/dL — ABNORMAL HIGH (ref 0.50–1.35)
GFR calc Af Amer: 20 mL/min — ABNORMAL LOW (ref >90)
GFR, EST NON AFRICAN AMERICAN: 18 mL/min — AB (ref >90)
GLUCOSE: 271 mg/dL — AB (ref 70–99)
Potassium: 4.1 mmol/L (ref 3.5–5.1)
SODIUM: 133 mmol/L — AB (ref 135–145)

## 2014-04-18 LAB — CBC
HCT: 36.5 % — ABNORMAL LOW (ref 39.0–52.0)
HEMOGLOBIN: 12.7 g/dL — AB (ref 13.0–17.0)
MCH: 32.1 pg (ref 26.0–34.0)
MCHC: 34.8 g/dL (ref 30.0–36.0)
MCV: 92.2 fL (ref 78.0–100.0)
Platelets: 139 10*3/uL — ABNORMAL LOW (ref 150–400)
RBC: 3.96 MIL/uL — ABNORMAL LOW (ref 4.22–5.81)
RDW: 13.2 % (ref 11.5–15.5)
WBC: 8.6 10*3/uL (ref 4.0–10.5)

## 2014-04-18 LAB — HEPARIN LEVEL (UNFRACTIONATED)
Heparin Unfractionated: 0.23 IU/mL — ABNORMAL LOW (ref 0.30–0.70)
Heparin Unfractionated: 0.71 IU/mL — ABNORMAL HIGH (ref 0.30–0.70)

## 2014-04-18 LAB — BLOOD GAS, ARTERIAL
Acid-base deficit: 1.1 mmol/L (ref 0.0–2.0)
Bicarbonate: 22.5 mEq/L (ref 20.0–24.0)
Drawn by: 22223
FIO2: 40 %
O2 Saturation: 98.9 %
PATIENT TEMPERATURE: 37
PEEP: 5 cmH2O
PH ART: 7.439 (ref 7.350–7.450)
RATE: 12 resp/min
TCO2: 19.7 mmol/L (ref 0–100)
VT: 700 mL
pCO2 arterial: 33.7 mmHg — ABNORMAL LOW (ref 35.0–45.0)
pO2, Arterial: 153 mmHg — ABNORMAL HIGH (ref 80.0–100.0)

## 2014-04-18 LAB — GLUCOSE, CAPILLARY
GLUCOSE-CAPILLARY: 284 mg/dL — AB (ref 70–99)
Glucose-Capillary: 225 mg/dL — ABNORMAL HIGH (ref 70–99)
Glucose-Capillary: 263 mg/dL — ABNORMAL HIGH (ref 70–99)
Glucose-Capillary: 381 mg/dL — ABNORMAL HIGH (ref 70–99)

## 2014-04-18 LAB — BETA-2-GLYCOPROTEIN I ABS, IGG/M/A
Beta-2 Glyco I IgG: <9 GPI IgG units (ref 0–20)
Beta-2-Glycoprotein I IgA: 25 GPI IgA units (ref 0–25)
Beta-2-Glycoprotein I IgM: <9 GPI IgM units (ref 0–32)

## 2014-04-18 LAB — LUPUS ANTICOAGULANT PANEL
DRVVT: 33.3 s (ref 0.0–55.1)
PTT Lupus Anticoagulant: 38 s (ref 0.0–50.0)

## 2014-04-18 LAB — FACTOR 5 LEIDEN

## 2014-04-18 LAB — PROTEIN C ACTIVITY: Protein C Activity: 71 % — ABNORMAL LOW (ref 74–151)

## 2014-04-18 LAB — PROTEIN S, TOTAL: Protein S Ag, Total: 128 % (ref 58–150)

## 2014-04-18 LAB — CARDIOLIPIN ANTIBODIES, IGG, IGM, IGA
Anticardiolipin IgA: <9 APL U/mL (ref 0–11)
Anticardiolipin IgG: <9 GPL U/mL (ref 0–14)
Anticardiolipin IgM: 9 MPL U/mL (ref 0–12)

## 2014-04-18 LAB — PROTEIN S ACTIVITY: Protein S Activity: 71 % (ref 60–145)

## 2014-04-18 MED ORDER — SODIUM CHLORIDE 0.9 % IJ SOLN
10.0000 mL | INTRAMUSCULAR | Status: DC | PRN
  Administered 2014-04-20 – 2014-04-26 (×4): 10 mL
  Filled 2014-04-18 (×4): qty 40

## 2014-04-18 MED ORDER — DEXMEDETOMIDINE HCL IN NACL 200 MCG/50ML IV SOLN
INTRAVENOUS | Status: AC
  Filled 2014-04-18: qty 100

## 2014-04-18 MED ORDER — "THROMBI-PAD 3""X3"" EX PADS"
MEDICATED_PAD | CUTANEOUS | Status: AC
  Administered 2014-04-18: 01:00:00
  Filled 2014-04-18: qty 1

## 2014-04-18 MED ORDER — DEXMEDETOMIDINE HCL IN NACL 200 MCG/50ML IV SOLN
INTRAVENOUS | Status: AC
Start: 2014-04-18 — End: 2014-04-18
  Filled 2014-04-18: qty 50

## 2014-04-18 MED ORDER — SODIUM CHLORIDE 0.9 % IJ SOLN
10.0000 mL | Freq: Two times a day (BID) | INTRAMUSCULAR | Status: DC
  Administered 2014-04-18: 10 mL
  Administered 2014-04-18 (×2): 40 mL
  Administered 2014-04-19 – 2014-04-25 (×11): 10 mL
  Administered 2014-04-25: 30 mL
  Administered 2014-04-26 (×2): 10 mL
  Administered 2014-04-27: 30 mL
  Administered 2014-04-27 – 2014-04-30 (×6): 10 mL

## 2014-04-18 MED ORDER — METHYLPREDNISOLONE SODIUM SUCC 40 MG IJ SOLR
40.0000 mg | Freq: Two times a day (BID) | INTRAMUSCULAR | Status: DC
  Administered 2014-04-18 – 2014-04-22 (×9): 40 mg via INTRAVENOUS
  Filled 2014-04-18 (×10): qty 1

## 2014-04-18 MED ORDER — METHYLPREDNISOLONE SODIUM SUCC 125 MG IJ SOLR
125.0000 mg | INTRAMUSCULAR | Status: AC
  Administered 2014-04-18: 125 mg via INTRAVENOUS
  Filled 2014-04-18: qty 2

## 2014-04-18 NOTE — Care Management Utilization Note (Signed)
UR completed 

## 2014-04-18 NOTE — Progress Notes (Signed)
Inpatient Diabetes Program Recommendations  AACE/ADA: New Consensus Statement on Inpatient Glycemic Control (2013)  Target Ranges:  Prepandial:   less than 140 mg/dL      Peak postprandial:   less than 180 mg/dL (1-2 hours)      Critically ill patients:  140 - 180 mg/dL   Results for Geri SeminoleDAMS, Jerret E (MRN [id number]) as of 04/18/2014 09:27  Ref. Range 04/17/2014 04:33 04/17/2014 07:25 04/17/2014 11:58 04/17/2014 16:22 04/17/2014 19:40 04/18/2014 02:39 04/18/2014 07:33  Glucose-Capillary Latest Range: 70-99 mg/dL 102102 (H) 725104 (H) 366117 (H) 195 (H) 225 (H) 263 (H) 284 (H)   Diabetes history: DM2 Outpatient Diabetes medications: Levemir 60 units daily, Metformin 500 mg daily Current orders for Inpatient glycemic control: Levemir 20 units daily, Novolog 0-15 units Q4H  Inpatient Diabetes Program Recommendations Insulin - Basal: Note Levemir was increased from 15 units to 20 units and patient will receive first dose of 20 units this morning. Insulin - Tube Feeding Coverage: Please consider ordering Novolog 3 units Q4H for tube feeding coverage (in addition to Novolog correction).  Thanks, Orlando PennerMarie Krislynn Gronau, RN, MSN, CCRN, CDE Diabetes Coordinator Inpatient Diabetes Program [phone] (Team Pager from 8am to 5pm) [phone] ([facility] office) [phone] [facility] office)

## 2014-04-18 NOTE — Progress Notes (Signed)
Subjective:  Sedated on vent. Opens eyes to voice.  Objective:  Vital Signs in the last 24 hours: Temp:  [96.3 F (35.7 C)-97.9 F (36.6 C)] 97.4 F (36.3 C) (03/23 0757) Pulse Rate:  [32-133] 130 (03/23 0715) Resp:  [12-26] 17 (03/23 0715) BP: (92-158)/(47-120) 124/83 mmHg (03/23 0715) SpO2:  [97 %-100 %] 100 % (03/23 0715) FiO2 (%):  [40 %] 40 % (03/23 0326) Weight:  [309 lb 1.4 oz (140.2 kg)] 309 lb 1.4 oz (140.2 kg) (03/23 0400)  Intake/Output from previous day: 03/22 0701 - 03/23 0700 In: 3420.6 [P.O.:260; I.V.:1782.4; NG/GT:678.2; IV Piggyback:700] Out: 6850 [Urine:6850] Intake/Output from this shift: Total I/O In: -  Out: 625 [Urine:625]  Physical Exam: NECK: Slight increase JVD, HJR,no bruit LUNGS:Decreased breath sounds with rales at bases HEART: Irregular rate and rhythm at 110/m, 2/6 systolic murmur, no gallop, rub, bruit, thrill, or heave EXTREMITIES: Without cyanosis, clubbing, or edema   Lab Results:  Recent Labs  04/18/14 0032 04/18/14 0450  WBC 7.7 8.6  HGB 13.0 12.7*  PLT 125* 139*    Recent Labs  04/17/14 0426 04/18/14 0450  NA 134* 133*  K 4.1 4.1  CL 99 96  CO2 20 23  GLUCOSE 109* 271*  BUN 77* 84*  CREATININE 4.25* 3.46*   No results for input(s): TROPONINI in the last 72 hours.  Invalid input(s): CK, MB Hepatic Function Panel  Recent Labs  04/16/14 0900  PROT 6.4  ALBUMIN 3.1*  AST 97*  ALT 54*  ALKPHOS 81  BILITOT 1.8*   No results for input(s): CHOL in the last 72 hours. No results for input(s): PROTIME in the last 72 hours.  Telemetry: atrial fibrillation with 4 beat run NSVT this am  Cardiac Studies:  Assessment/Plan:  1. Mixed Systolic/Diastolic Dysfunction: EF 20%. Now on dobutamine gtt, lasix gtt with negative 3429 output. Edema of LE is improving. Creatinine improving 3.46. Suspect ETOH cardiomyopathy. He remains intubated.   2. Bilateral DVT's: on heparin gtt. Some blood in oral gastric tube. He is being  followed by pharmacy for dosing and labs. Unable to do VQ now to confirm PE.   3. ACE induced angioedema: Creatinine and edema improving.  4. Renal Failure: Creatinine improving. Nephrology is on board.   5.NSVT  6. Atrial fibrillation/flutter rate 110/m will eventually need Coreg  7. Sepsis: blood cultures positive: on vanco & zosyn    LOS: 5 days    Gabriel Reynolds 04/18/2014, 8:18 AM   The patient was seen and examined, and I agree with the assessment and plan as documented above, with modifications as noted below. Pt with late hypersensitivity reaction to ACEI, treated with steroids and removal of offending agent. Also with DT's. Has acute renal failure, unclear etiology at this time. Question whether he has a vasculitic process. However, creatinine is improving today. Eventually he will need a renal ultrasound and perhaps renal biopsy. Outside records would be helpful to establish his baseline. Due to RV dysfunction and hypotension and inability to obtain a VQ scan, I ordered LE Dopplers on 3/21 which confirmed presence of DVT. He has been started on IV heparin.  Started dobutamine due to biventricular heart failure (possible alcoholic cardiomyopathy). Has had nearly 4 L urine output. Had one 5-beat run of NSVT and frequent PVC's. Rhythms have been atrial fibrillation, flutter, and sinus tachycardia. Started Lasix infusion at 8 mg/hr on 3/21 and will plan to continue given improved urine output. He is on empiric vancomycin and Zosyn. Will continue present  management for now.

## 2014-04-18 NOTE — Progress Notes (Signed)
Shift note: Patient had increased bleeding at PICC line site even after reinforced and dressing change performed throughout the night. Dr. Kathie RhodesS. Newton called for Thrombi-pad and pressure dressing order which has seemed to slow it down; STAT CBC - Hbg=13.0.  Will pass onto first shift to have PICC team look at line.  All intravenous switched to his central line.  Patient did display increased tongue swelling throughout the night after mouth care cleaned up his blood in mouth from biting tongue earlier in the shift.  Dr. Kathie RhodesS. Newton called to floor to look at patient's tongue; orders given.

## 2014-04-18 NOTE — Progress Notes (Signed)
ANTICOAGULATION CONSULT NOTE  Pharmacy Consult for Heparin Indication: DVT  Allergies  Allergen Reactions  . Ace Inhibitors Swelling  . Other Hives    wool   Patient Measurements: Height: 6\' 1"  (185.4 cm) Weight: (!) 309 lb 1.4 oz (140.2 kg) IBW/kg (Calculated) : 79.9 Heparin Dosing Weight: 111.4 kg  Vital Signs: Temp: 97.5 F (36.4 C) (03/23 1128) Temp Source: Axillary (03/23 1128) BP: 127/78 mmHg (03/23 1530) Pulse Rate: 43 (03/23 1530)  Labs:  Recent Labs  04/16/14 0900 04/16/14 1006  04/17/14 0426  04/17/14 2100 04/18/14 0032 04/18/14 0450 04/18/14 1250  HGB 13.8 DUPLICATE  --  14.2  --   --  13.0 12.7*  --   HCT 41.6 DUPLICATE  --  40.8  --   --  37.5* 36.5*  --   PLT 157 DUPLICATE  --  121*  --   --  125* 139*  --   APTT  --  31  --   --   --   --   --   --   --   LABPROT  --  19.1*  --  20.5*  --   --   --   --   --   INR  --  1.59*  --  1.74*  --   --   --   --   --   HEPARINUNFRC  --   --   < > 1.02*  < > 1.10*  --  0.23* 0.71*  CREATININE 4.26*  --   --  4.25*  --   --   --  3.46*  --   < > = values in this interval not displayed. Estimated Creatinine Clearance: 33 mL/min (by C-G formula based on Cr of 3.46).  Medical History: Past Medical History  Diagnosis Date  . Hypertension   . Diabetes mellitus    Medications:  Scheduled:  . antiseptic oral rinse  7 mL Mouth Rinse QID  . aspirin  81 mg Per Tube Daily  . chlorhexidine  15 mL Mouth Rinse BID  . feeding supplement (PRO-STAT SUGAR FREE 64)  30 mL Per Tube BID  . folic acid  1 mg Per Tube Daily  . insulin aspart  0-15 Units Subcutaneous 6 times per day  . insulin detemir  20 Units Subcutaneous Daily  . methylPREDNISolone (SOLU-MEDROL) injection  40 mg Intravenous Q12H  . multivitamin with minerals  1 tablet Per Tube Daily  . mupirocin ointment  1 application Nasal BID  . piperacillin-tazobactam (ZOSYN)  IV  3.375 g Intravenous Q8H  . sodium chloride  10-40 mL Intracatheter Q12H  . sodium  chloride  10-40 mL Intracatheter Q12H  . sodium chloride  3 mL Intravenous Q12H  . thiamine  100 mg Oral Daily   Or  . thiamine  100 mg Intravenous Daily  . [START ON 04/19/2014] vancomycin  2,000 mg Intravenous Q48H   Assessment: 59 yo obese M with + BLE DVT.  He is currently intubated on Precedex and has impaired renal & liver function.   Heparin has not been stable despite multiple rate changes.  He is now at upper end of goal range.  RN describes correct lab draw.  RN does not report any further bleeding.    Goal of Therapy:  Heparin level 0.3-0.7 units/ml w/in 24 hrs of initiation Monitor platelets by anticoagulation protocol: Yes   Plan:  Reduce Heparin infusion to 850 units/hr Recheck Heparin level with am labs Heparin level and CBC  daily while on Heparin  Elson Clan 04/18/2014,3:56 PM

## 2014-04-18 NOTE — Progress Notes (Signed)
Geri SeminoleRalph E Weinreb  MRN: [id number]  DOB/AGE: 08/15/1955 59 y.o.  Primary Care Physician:Pcp Not In System  Admit date: 04/13/2014  Chief Complaint:  Chief Complaint  Patient presents with  . Facial Swelling  . Leg Swelling    S-Pt presented on  04/13/2014 with  Chief Complaint  Patient presents with  . Facial Swelling  . Leg Swelling  .    Pt  Intubated, unable to offers any complaints  Meds  . antiseptic oral rinse  7 mL Mouth Rinse QID  . aspirin  81 mg Per Tube Daily  . chlorhexidine  15 mL Mouth Rinse BID  . feeding supplement (PRO-STAT SUGAR FREE 64)  30 mL Per Tube BID  . folic acid  1 mg Per Tube Daily  . insulin aspart  0-15 Units Subcutaneous 6 times per day  . insulin detemir  20 Units Subcutaneous Daily  . methylPREDNISolone (SOLU-MEDROL) injection  40 mg Intravenous Q12H  . multivitamin with minerals  1 tablet Per Tube Daily  . mupirocin ointment  1 application Nasal BID  . piperacillin-tazobactam (ZOSYN)  IV  3.375 g Intravenous Q8H  . sodium chloride  10-40 mL Intracatheter Q12H  . sodium chloride  3 mL Intravenous Q12H  . thiamine  100 mg Oral Daily   Or  . thiamine  100 mg Intravenous Daily  . [START ON 04/19/2014] vancomycin  2,000 mg Intravenous Q48H      Physical Exam: Vital signs in last 24 hours: Temp:  [96.3 F (35.7 C)-97.9 F (36.6 C)] 97.4 F (36.3 C) (03/23 0757) Pulse Rate:  [32-133] 67 (03/23 0900) Resp:  [12-26] 16 (03/23 0900) BP: (92-158)/(47-120) 105/71 mmHg (03/23 0900) SpO2:  [96 %-100 %] 96 % (03/23 0900) FiO2 (%):  [40 %] 40 % (03/23 0832) Weight:  [309 lb 1.4 oz (140.2 kg)] 309 lb 1.4 oz (140.2 kg) (03/23 0400) Weight change: 4 lb 6.6 oz (2 kg) Last BM Date: 04/14/14  Intake/Output from previous day: 03/22 0701 - 03/23 0700 In: 3420.6 [P.O.:260; I.V.:1782.4; NG/GT:678.2; IV Piggyback:700] Out: 6850 [Urine:6850] Total I/O In: 50 [NG/GT:50] Out: 625 [Urine:625]   Physical Exam: General- intubated Resp-ET tube in situ  , Rhonchi + CVS- S1S2 regular in rate and tachycardiac GIT- BS+, soft, NT, ND EXT- 2+ LE Edema, No Cyanosis   Lab Results: CBC  Recent Labs  04/18/14 0032 04/18/14 0450  WBC 7.7 8.6  HGB 13.0 12.7*  HCT 37.5* 36.5*  PLT 125* 139*    BMET  Recent Labs  04/17/14 0426 04/18/14 0450  NA 134* 133*  K 4.1 4.1  CL 99 96  CO2 20 23  GLUCOSE 109* 271*  BUN 77* 84*  CREATININE 4.25* 3.46*  CALCIUM 9.0 8.7   Creat trend 2016 1.98=> 4.25-4.26=>3.46 2009  1.4   MICRO Recent Results (from the past 240 hour(s))  MRSA PCR Screening     Status: Abnormal   Collection Time: 04/13/14  4:00 PM  Result Value Ref Range Status   MRSA by PCR POSITIVE (A) NEGATIVE Final    Comment:        The GeneXpert MRSA Assay (FDA approved for NASAL specimens only), is one component of a comprehensive MRSA colonization surveillance program. It is not intended to diagnose MRSA infection nor to guide or monitor treatment for MRSA infections. RESULT CALLED TO, READ BACK BY AND VERIFIED WITH:  CUMMINGS,R @ 2205 ON 04/13/14 BY WOODIE,J   Culture, blood (x 2)     Status: None (Preliminary result)  Collection Time: 04/16/14 10:06 AM  Result Value Ref Range Status   Specimen Description BLOOD PORTA CATH DRAWN BY RN  Final   Special Requests BOTTLES DRAWN AEROBIC AND ANAEROBIC 6CC  Final   Culture   Final    GRAM POSITIVE COCCI IN CHAINS GRAM STAIN CALLED PREVIOUSLY TO MCDANIEL,M. AT 0700 BY RESSEGGER,R. Performed at [facility]    Report Status PENDING  Incomplete  Culture, blood (x 2)     Status: None (Preliminary result)   Collection Time: 04/16/14 10:16 AM  Result Value Ref Range Status   Specimen Description BLOOD RIGHT ANTECUBITAL  Final   Special Requests BOTTLES DRAWN AEROBIC AND ANAEROBIC 10CC  Final   Culture   Final    GRAM POSITIVE COCCI IN CHAINS Note: Gram Stain Report Called to,Read Back By and Verified With: MISTY MCDANIEL RN 04/17/14 0700 BY RESSEGGER R Performed  at [facility] Performed at [employer]    Report Status PENDING  Incomplete      Lab Results  Component Value Date   CALCIUM 8.7 04/18/2014   CAION 1.21 11/13/2007         Impression: 1)Renal  AKI secondary to Prerenal/ hepatorenal/ATN/Cardiorenal/ACE               Pt urine output now much better               AKi improving               AKI on CKD ( Most likley )               CKD stage not sure as last creat was 1.4 from 2009.               CKD since 2009 ( most likley , not much data available)               CKD secondary to Cardiorenal               Progression of CKD now marked with AKI               2)CVS- hemodynamiclly stable  3)Anemia HGb at goal (9--11)   4)Resp- admitted with resp failure/pneumonia  now intubated On IV abx  5)CHF- Alcoholic Cardiomyopathy  Primary MD and Cardiology following  6)Electrolytes Normokalemic  Hyponatremic stable  7)Acid base Co2 at goal   8) DVT- on Iv Heprain  9) ID- Blood Cultures positive  on Vanco + Zosyn   Plan:  Will continue current care       Tajuana Kniskern S 04/18/2014, 9:21 AM

## 2014-04-18 NOTE — Progress Notes (Signed)
Patient extubated himself. Currently, he is receiving assisted ventilation with Ambu bag and is maintaining adequate oxygen saturations although is noted to be tachycardic. Examination the oral cavity shows dried blood and significantly edematous tongue with poor visualization of the pharynx. Patient needs to be reintubated to protect his airway.  INTUBATION Performed by: ZOXWR,UEAVWGLICK,Chayse Zatarain  Required items: required blood products, implants, devices, and special equipment available Patient identity confirmed: provided demographic data and hospital-assigned identification number Time out: Immediately prior to procedure a "time out" was called to verify the correct patient, procedure, equipment, support staff and site/side marked as required.  Indications: Angioedema of the tongue and oral cavity   Intubation method: Glidescope Laryngoscopy   Preoxygenation: BVM  Sedatives: Etomidate Paralytic: Succinylcholine  Tube Size: 7.0 cuffed  Post-procedure assessment: chest rise and ETCO2 monitor Breath sounds: equal and absent over the epigastrium Tube secured with: ETT holder Chest x-ray interpreted by radiologist and me.  Chest x-ray findings: endotracheal tube was high up in the trachea and was advanced   Patient tolerated the procedure well with no immediate complications.

## 2014-04-18 NOTE — Progress Notes (Signed)
Subjective: He remains intubated and on the ventilator. The information about his increasing tongue swelling is noted. Increase his Solu-Medrol somewhat. Angioedema also responds to fresh frozen plasma if this continues to be a problem.  Objective: Vital signs in last 24 hours: Temp:  [96.3 F (35.7 C)-97.9 F (36.6 C)] 97.9 F (36.6 C) (03/23 0400) Pulse Rate:  [32-133] 130 (03/23 0715) Resp:  [12-26] 17 (03/23 0715) BP: (92-158)/(47-120) 124/83 mmHg (03/23 0715) SpO2:  [97 %-100 %] 100 % (03/23 0715) FiO2 (%):  [40 %] 40 % (03/23 0326) Weight:  [140.2 kg (309 lb 1.4 oz)] 140.2 kg (309 lb 1.4 oz) (03/23 0400) Weight change: 2 kg (4 lb 6.6 oz) Last BM Date: 04/13/14  Intake/Output from previous day: 03/22 0701 - 03/23 0700 In: 3420.6 [P.O.:260; I.V.:1782.4; NG/GT:678.2; IV Piggyback:700] Out: 6850 [Urine:6850]  PHYSICAL EXAM General appearance: Intubated sedated able to respond somewhat. I don't see a significant difference in his tongue Resp: He still has some rhonchi and a few basilar rales Cardio: His heart is regular but tachycardic GI: His abdomen is somewhat protuberant and I think his bowel sounds are  diminished Extremities: He has some generalized third spacing of fluid  Lab Results:  Results for orders placed or performed during the hospital encounter of 04/13/14 (from the past 48 hour(s))  Comprehensive metabolic panel     Status: Abnormal   Collection Time: 04/16/14  9:00 AM  Result Value Ref Range   Sodium 133 (L) 135 - 145 mmol/L   Potassium 5.0 3.5 - 5.1 mmol/L    Comment: DELTA CHECK NOTED   Chloride 99 96 - 112 mmol/L   CO2 22 19 - 32 mmol/L   Glucose, Bld 154 (H) 70 - 99 mg/dL   BUN 65 (H) 6 - 23 mg/dL   Creatinine, Ser 4.26 (H) 0.50 - 1.35 mg/dL   Calcium 8.9 8.4 - 10.5 mg/dL   Total Protein 6.4 6.0 - 8.3 g/dL   Albumin 3.1 (L) 3.5 - 5.2 g/dL   AST 97 (H) 0 - 37 U/L   ALT 54 (H) 0 - 53 U/L   Alkaline Phosphatase 81 39 - 117 U/L   Total Bilirubin  1.8 (H) 0.3 - 1.2 mg/dL   GFR calc non Af Amer 14 (L) >90 mL/min   GFR calc Af Amer 16 (L) >90 mL/min    Comment: (NOTE) The eGFR has been calculated using the CKD EPI equation. This calculation has not been validated in all clinical situations. eGFR's persistently <90 mL/min signify possible Chronic Kidney Disease.    Anion gap 12 5 - 15  CBC     Status: None   Collection Time: 04/16/14  9:00 AM  Result Value Ref Range   WBC 8.1 4.0 - 10.5 K/uL   RBC 4.37 4.22 - 5.81 MIL/uL   Hemoglobin 13.8 13.0 - 17.0 g/dL   HCT 41.6 39.0 - 52.0 %   MCV 95.2 78.0 - 100.0 fL   MCH 31.6 26.0 - 34.0 pg   MCHC 33.2 30.0 - 36.0 g/dL   RDW 13.6 11.5 - 15.5 %   Platelets 157 150 - 400 K/uL  Magnesium     Status: None   Collection Time: 04/16/14  9:00 AM  Result Value Ref Range   Magnesium 1.7 1.5 - 2.5 mg/dL  Procalcitonin     Status: None   Collection Time: 04/16/14  9:00 AM  Result Value Ref Range   Procalcitonin 1.32 ng/mL    Comment:  Interpretation: PCT > 0.5 ng/mL and <= 2 ng/mL: Systemic infection (sepsis) is possible, but other conditions are known to elevate PCT as well. (NOTE)         ICU PCT Algorithm               Non ICU PCT Algorithm    ----------------------------     ------------------------------         PCT < 0.25 ng/mL                 PCT < 0.1 ng/mL     Stopping of antibiotics            Stopping of antibiotics       strongly encouraged.               strongly encouraged.    ----------------------------     ------------------------------       PCT level decrease by               PCT < 0.25 ng/mL       >= 80% from peak PCT       OR PCT 0.25 - 0.5 ng/mL          Stopping of antibiotics                                             encouraged.     Stopping of antibiotics           encouraged.    ----------------------------     ------------------------------       PCT level decrease by              PCT >= 0.25 ng/mL       < 80% from peak PCT        AND PCT >= 0.5  ng/mL             Continuing antibiotics                                              encouraged.       Continuing antibiotics            encouraged.    ----------------------------     ------------------------------     PCT level increase compared          PCT > 0.5 ng/mL         with peak PCT AND          PCT >= 0.5 ng/mL             Escalation of antibiotics                                          strongly encouraged.      Escalation of antibiotics        strongly encouraged.   Lactic acid, plasma     Status: Abnormal   Collection Time: 04/16/14  9:15 AM  Result Value Ref Range   Lactic Acid, Venous 3.2 (HH) 0.5 - 2.0 mmol/L    Comment: CRITICAL RESULT CALLED TO, READ BACK BY AND VERIFIED WITH: PHILLIPS,C AT 10:05AM ON 04/16/14 BY  FESTERMAN,C   Culture, blood (x 2)     Status: None (Preliminary result)   Collection Time: 04/16/14 10:06 AM  Result Value Ref Range   Specimen Description BLOOD PORTA CATH DRAWN BY RN    Special Requests BOTTLES DRAWN AEROBIC AND ANAEROBIC 6CC    Culture      GRAM POSITIVE COCCI IN CHAINS GRAM STAIN CALLED PREVIOUSLY TO MCDANIEL,M. AT 0700 BY RESSEGGER,R. Performed at [facility]    Report Status PENDING   CBC with Differential     Status: Abnormal   Collection Time: 04/16/14 10:06 AM  Result Value Ref Range   WBC DUPLICATE 4.0 - 78.5 K/uL   RBC DUPLICATE 8.85 - 0.27 MIL/uL   Hemoglobin DUPLICATE 74.1 - 28.7 g/dL   HCT DUPLICATE 86.7 - 67.2 %   MCV DUPLICATE 09.4 - 709.6 fL   MCH DUPLICATE 28.3 - 66.2 pg   MCHC DUPLICATE 94.7 - 65.4 g/dL   RDW DUPLICATE 65.0 - 35.4 %   Platelets DUPLICATE 656 - 812 K/uL   Neutrophils Relative % 81 (H) 43 - 77 %   Neutro Abs 6.4 1.7 - 7.7 K/uL   Lymphocytes Relative 13 12 - 46 %   Lymphs Abs 1.0 0.7 - 4.0 K/uL   Monocytes Relative 6 3 - 12 %   Monocytes Absolute 0.5 0.1 - 1.0 K/uL   Eosinophils Relative 0 0 - 5 %   Eosinophils Absolute 0.0 0.0 - 0.7 K/uL   Basophils Relative 0 0 - 1 %    Basophils Absolute 0.0 0.0 - 0.1 K/uL  Protime-INR     Status: Abnormal   Collection Time: 04/16/14 10:06 AM  Result Value Ref Range   Prothrombin Time 19.1 (H) 11.6 - 15.2 seconds   INR 1.59 (H) 0.00 - 1.49  APTT     Status: None   Collection Time: 04/16/14 10:06 AM  Result Value Ref Range   aPTT 31 24 - 37 seconds  Culture, blood (x 2)     Status: None (Preliminary result)   Collection Time: 04/16/14 10:16 AM  Result Value Ref Range   Specimen Description BLOOD    Special Requests NONE    Culture      GRAM POSITIVE COCCI IN CHAINS Gram Stain Report Called to,Read Back By and Verified With: MISTY MCDANIEL RN ON [id number] AT 0700 BY RESSEGGER R Performed at [facility]    Report Status PENDING   Glucose, capillary     Status: Abnormal   Collection Time: 04/16/14 12:12 PM  Result Value Ref Range   Glucose-Capillary 155 (H) 70 - 99 mg/dL   Comment 1 Notify RN    Comment 2 Document in Chart   Lactic acid, plasma     Status: Abnormal   Collection Time: 04/16/14  1:17 PM  Result Value Ref Range   Lactic Acid, Venous 2.2 (HH) 0.5 - 2.0 mmol/L    Comment: RESULT REPEATED AND VERIFIED CRITICAL RESULT CALLED TO, READ BACK BY AND VERIFIED WITH: GRAY,M AT 1403 ON 04/16/14 BY WOODS,M   Glucose, capillary     Status: Abnormal   Collection Time: 04/16/14  4:46 PM  Result Value Ref Range   Glucose-Capillary 149 (H) 70 - 99 mg/dL   Comment 1 Notify RN    Comment 2 Document in Chart   Lactic acid, plasma     Status: None   Collection Time: 04/16/14  5:35 PM  Result Value Ref Range   Lactic Acid, Venous 1.6 0.5 -  2.0 mmol/L  Antithrombin III     Status: None   Collection Time: 04/16/14  5:35 PM  Result Value Ref Range   AntiThromb III Func 86 75 - 120 %    Comment: Performed at [facility]  Protein C activity     Status: Abnormal   Collection Time: 04/16/14  5:35 PM  Result Value Ref Range   Protein C Activity 71 (L) 74 - 151 %    Comment: (NOTE) A deficiency of  protein C (PC), either congenital or acquired, increases the risk of thromboembolism. Congenital deficiencies of PC are very rare; acquired PC deficiency is much more common. PC levels can be transiently diminished after an acute thrombotic event. Oral anticoagulant therapy with warfarin will lower PC levels as well as vitamin K deficiency. Acquired deficiency can also occur in individuals with disseminated intravascular coagulation (DIC), sepsis, severe liver disease, nephrotic syndrome, and in inflammatory bowel disease. Levels may be spuriously decreased in individuals with Factor V Leiden. Drug therapy with L-asparaginse, fluorouracil, methotrexate, cyclophosphamide or tamoxifen can also reduce PC levels. It has been suggested that repeat blood sampling and testing after ruling out acquired causes of deficiency should be performed before the patient is diagnosed with congenital Protein C deficiency. Performed At: [facility] [place], [place] [id number] Lindon Romp MD [id number]   Protein S activity     Status: None   Collection Time: 04/16/14  5:35 PM  Result Value Ref Range   Protein S Activity 71 60 - 145 %    Comment: (NOTE) Performed At: [facility] [address] [place], [place] [id number] Lindon Romp MD [id number]   Protein S, total     Status: None   Collection Time: 04/16/14  5:35 PM  Result Value Ref Range   Protein S Ag, Total 128 58 - 150 %    Comment: (NOTE) Performed At: [facility] [place], [place] [id number] Lindon Romp MD [id number]   Lupus anticoagulant panel     Status: None   Collection Time: 04/16/14  5:35 PM  Result Value Ref Range   PTT Lupus Anticoagulant 38.0 0.0 - 50.0 sec   DRVVT 33.3 0.0 - 55.1 sec   Lupus Anticoag Interp Comment:     Comment: (NOTE) No lupus anticoagulant was detected. Performed At: [facility] [place], [place]  [id number] Lindon Romp MD IT:[id number]   Homocysteine, serum     Status: Abnormal   Collection Time: 04/16/14  5:35 PM  Result Value Ref Range   Homocysteine 56.6 (H) 0.0 - 15.0 umol/L    Comment: (NOTE) Performed At: [facility] [place], [place] [id number] Lindon Romp MD RK:[id number]   Cardiolipin antibodies, IgG, IgM, IgA     Status: None   Collection Time: 04/16/14  5:35 PM  Result Value Ref Range   Anticardiolipin IgG <9 0 - 14 GPL U/mL    Comment: (NOTE)                          Negative:              <15                          Indeterminate:     15 - 20  Low-Med Positive: >20 - 80                          High Positive:         >80    Anticardiolipin IgM 9 0 - 12 MPL U/mL    Comment: (NOTE)                          Negative:              <13                          Indeterminate:     13 - 20                          Low-Med Positive: >20 - 80                          High Positive:         >80    Anticardiolipin IgA <9 0 - 11 APL U/mL    Comment: (NOTE)                          Negative:              <12                          Indeterminate:     12 - 20                          Low-Med Positive: >20 - 80                          High Positive:         >80 Performed At: [facility] [place], [place] [id number] Lindon Romp MD EH:[id number]   Sodium, urine, random     Status: None   Collection Time: 04/16/14  6:05 PM  Result Value Ref Range   Sodium, Ur 54 mmol/L  Creatinine, urine, random     Status: None   Collection Time: 04/16/14  6:05 PM  Result Value Ref Range   Creatinine, Urine 66.89 mg/dL  Urinalysis, Routine w reflex microscopic     Status: Abnormal   Collection Time: 04/16/14  6:05 PM  Result Value Ref Range   Color, Urine YELLOW YELLOW   APPearance CLEAR CLEAR   Specific Gravity, Urine 1.020 1.005 - 1.030   pH 5.0 5.0 - 8.0   Glucose, UA NEGATIVE NEGATIVE  mg/dL   Hgb urine dipstick MODERATE (A) NEGATIVE   Bilirubin Urine NEGATIVE NEGATIVE   Ketones, ur NEGATIVE NEGATIVE mg/dL   Protein, ur NEGATIVE NEGATIVE mg/dL   Urobilinogen, UA 0.2 0.0 - 1.0 mg/dL   Nitrite NEGATIVE NEGATIVE   Leukocytes, UA SMALL (A) NEGATIVE  Urine microscopic-add on     Status: Abnormal   Collection Time: 04/16/14  6:05 PM  Result Value Ref Range   Squamous Epithelial / LPF MANY (A) RARE   WBC, UA 7-10 <3 WBC/hpf   RBC / HPF 11-20 <3 RBC/hpf   Bacteria, UA MANY (A) RARE   Urine-Other AMORPHOUS URATES/PHOSPHATES   Glucose, capillary  Status: Abnormal   Collection Time: 04/16/14  7:30 PM  Result Value Ref Range   Glucose-Capillary 167 (H) 70 - 99 mg/dL  Heparin level (unfractionated)     Status: Abnormal   Collection Time: 04/16/14 10:00 PM  Result Value Ref Range   Heparin Unfractionated 0.93 (H) 0.30 - 0.70 IU/mL    Comment:        IF HEPARIN RESULTS ARE BELOW EXPECTED VALUES, AND PATIENT DOSAGE HAS BEEN CONFIRMED, SUGGEST FOLLOW UP TESTING OF ANTITHROMBIN III LEVELS.   Glucose, capillary     Status: Abnormal   Collection Time: 04/17/14 12:20 AM  Result Value Ref Range   Glucose-Capillary 141 (H) 70 - 99 mg/dL  Basic metabolic panel     Status: Abnormal   Collection Time: 04/17/14  4:26 AM  Result Value Ref Range   Sodium 134 (L) 135 - 145 mmol/L   Potassium 4.1 3.5 - 5.1 mmol/L    Comment: DELTA CHECK NOTED   Chloride 99 96 - 112 mmol/L   CO2 20 19 - 32 mmol/L   Glucose, Bld 109 (H) 70 - 99 mg/dL   BUN 77 (H) 6 - 23 mg/dL   Creatinine, Ser 4.25 (H) 0.50 - 1.35 mg/dL   Calcium 9.0 8.4 - 10.5 mg/dL   GFR calc non Af Amer 14 (L) >90 mL/min   GFR calc Af Amer 16 (L) >90 mL/min    Comment: (NOTE) The eGFR has been calculated using the CKD EPI equation. This calculation has not been validated in all clinical situations. eGFR's persistently <90 mL/min signify possible Chronic Kidney Disease.    Anion gap 15 5 - 15  CBC     Status: Abnormal    Collection Time: 04/17/14  4:26 AM  Result Value Ref Range   WBC 9.1 4.0 - 10.5 K/uL   RBC 4.43 4.22 - 5.81 MIL/uL   Hemoglobin 14.2 13.0 - 17.0 g/dL   HCT 40.8 39.0 - 52.0 %   MCV 92.1 78.0 - 100.0 fL   MCH 32.1 26.0 - 34.0 pg   MCHC 34.8 30.0 - 36.0 g/dL   RDW 13.3 11.5 - 15.5 %   Platelets 121 (L) 150 - 400 K/uL  Heparin level (unfractionated)     Status: Abnormal   Collection Time: 04/17/14  4:26 AM  Result Value Ref Range   Heparin Unfractionated 1.02 (H) 0.30 - 0.70 IU/mL    Comment: RESULTS CONFIRMED BY MANUAL DILUTION        IF HEPARIN RESULTS ARE BELOW EXPECTED VALUES, AND PATIENT DOSAGE HAS BEEN CONFIRMED, SUGGEST FOLLOW UP TESTING OF ANTITHROMBIN III LEVELS.   Protime-INR     Status: Abnormal   Collection Time: 04/17/14  4:26 AM  Result Value Ref Range   Prothrombin Time 20.5 (H) 11.6 - 15.2 seconds   INR 1.74 (H) 0.00 - 1.49  Magnesium     Status: None   Collection Time: 04/17/14  4:26 AM  Result Value Ref Range   Magnesium 2.1 1.5 - 2.5 mg/dL  Glucose, capillary     Status: Abnormal   Collection Time: 04/17/14  4:33 AM  Result Value Ref Range   Glucose-Capillary 102 (H) 70 - 99 mg/dL  Blood gas, arterial     Status: Abnormal   Collection Time: 04/17/14  4:50 AM  Result Value Ref Range   FIO2 40.00 %   Delivery systems VENTILATOR    Mode PRESSURE REGULATED VOLUME CONTROL    VT 700 mL   Rate 12.0 resp/min  Peep/cpap 5.0 cm H20   pH, Arterial 7.424 7.350 - 7.450   pCO2 arterial 29.7 (L) 35.0 - 45.0 mmHg   pO2, Arterial 126.0 (H) 80.0 - 100.0 mmHg   Bicarbonate 19.1 (L) 20.0 - 24.0 mEq/L   TCO2 16.6 0 - 100 mmol/L   Acid-base deficit 4.5 (H) 0.0 - 2.0 mmol/L   O2 Saturation 98.3 %   Patient temperature 37.0    Collection site RIGHT RADIAL    Drawn by [id number]    Sample type ARTERIAL    Allens test (pass/fail) PASS PASS  Glucose, capillary     Status: Abnormal   Collection Time: 04/17/14  7:25 AM  Result Value Ref Range   Glucose-Capillary 104 (H)  70 - 99 mg/dL   Comment 1 Notify RN    Comment 2 Document in Chart   Glucose, capillary     Status: Abnormal   Collection Time: 04/17/14 11:58 AM  Result Value Ref Range   Glucose-Capillary 117 (H) 70 - 99 mg/dL   Comment 1 Notify RN    Comment 2 Document in Chart   Heparin level (unfractionated)     Status: Abnormal   Collection Time: 04/17/14  1:34 PM  Result Value Ref Range   Heparin Unfractionated 1.04 (H) 0.30 - 0.70 IU/mL    Comment:        IF HEPARIN RESULTS ARE BELOW EXPECTED VALUES, AND PATIENT DOSAGE HAS BEEN CONFIRMED, SUGGEST FOLLOW UP TESTING OF ANTITHROMBIN III LEVELS.   Glucose, capillary     Status: Abnormal   Collection Time: 04/17/14  4:22 PM  Result Value Ref Range   Glucose-Capillary 195 (H) 70 - 99 mg/dL  Heparin level (unfractionated)     Status: Abnormal   Collection Time: 04/17/14  9:00 PM  Result Value Ref Range   Heparin Unfractionated 1.10 (H) 0.30 - 0.70 IU/mL    Comment: RESULTS CONFIRMED BY MANUAL DILUTION        IF HEPARIN RESULTS ARE BELOW EXPECTED VALUES, AND PATIENT DOSAGE HAS BEEN CONFIRMED, SUGGEST FOLLOW UP TESTING OF ANTITHROMBIN III LEVELS.   CBC with Differential/Platelet     Status: Abnormal   Collection Time: 04/18/14 12:32 AM  Result Value Ref Range   WBC 7.7 4.0 - 10.5 K/uL   RBC 4.08 (L) 4.22 - 5.81 MIL/uL   Hemoglobin 13.0 13.0 - 17.0 g/dL   HCT 37.5 (L) 39.0 - 52.0 %   MCV 91.9 78.0 - 100.0 fL   MCH 31.9 26.0 - 34.0 pg   MCHC 34.7 30.0 - 36.0 g/dL   RDW 13.1 11.5 - 15.5 %   Platelets 125 (L) 150 - 400 K/uL   Neutrophils Relative % 83 (H) 43 - 77 %   Neutro Abs 6.4 1.7 - 7.7 K/uL   Lymphocytes Relative 9 (L) 12 - 46 %   Lymphs Abs 0.7 0.7 - 4.0 K/uL   Monocytes Relative 8 3 - 12 %   Monocytes Absolute 0.6 0.1 - 1.0 K/uL   Eosinophils Relative 0 0 - 5 %   Eosinophils Absolute 0.0 0.0 - 0.7 K/uL   Basophils Relative 0 0 - 1 %   Basophils Absolute 0.0 0.0 - 0.1 K/uL  Blood gas, arterial     Status: Abnormal    Collection Time: 04/18/14  3:46 AM  Result Value Ref Range   FIO2 40.00 %   Delivery systems VENTILATOR    Mode PRESSURE REGULATED VOLUME CONTROL    VT 700 mL   Rate 12 resp/min  Peep/cpap 5.0 cm H20   pH, Arterial 7.439 7.350 - 7.450   pCO2 arterial 33.7 (L) 35.0 - 45.0 mmHg   pO2, Arterial 153.0 (H) 80.0 - 100.0 mmHg   Bicarbonate 22.5 20.0 - 24.0 mEq/L   TCO2 19.7 0 - 100 mmol/L   Acid-base deficit 1.1 0.0 - 2.0 mmol/L   O2 Saturation 98.9 %   Patient temperature 37.0    Collection site RIGHT RADIAL    Drawn by [id number]    Sample type ARTERIAL    Allens test (pass/fail) PASS PASS  Heparin level (unfractionated)     Status: Abnormal   Collection Time: 04/18/14  4:50 AM  Result Value Ref Range   Heparin Unfractionated 0.23 (L) 0.30 - 0.70 IU/mL    Comment:        IF HEPARIN RESULTS ARE BELOW EXPECTED VALUES, AND PATIENT DOSAGE HAS BEEN CONFIRMED, SUGGEST FOLLOW UP TESTING OF ANTITHROMBIN III LEVELS.   Basic metabolic panel     Status: Abnormal   Collection Time: 04/18/14  4:50 AM  Result Value Ref Range   Sodium 133 (L) 135 - 145 mmol/L   Potassium 4.1 3.5 - 5.1 mmol/L   Chloride 96 96 - 112 mmol/L   CO2 23 19 - 32 mmol/L   Glucose, Bld 271 (H) 70 - 99 mg/dL   BUN 84 (H) 6 - 23 mg/dL   Creatinine, Ser 3.46 (H) 0.50 - 1.35 mg/dL   Calcium 8.7 8.4 - 10.5 mg/dL   GFR calc non Af Amer 18 (L) >90 mL/min   GFR calc Af Amer 20 (L) >90 mL/min    Comment: (NOTE) The eGFR has been calculated using the CKD EPI equation. This calculation has not been validated in all clinical situations. eGFR's persistently <90 mL/min signify possible Chronic Kidney Disease.    Anion gap 14 5 - 15  CBC     Status: Abnormal   Collection Time: 04/18/14  4:50 AM  Result Value Ref Range   WBC 8.6 4.0 - 10.5 K/uL   RBC 3.96 (L) 4.22 - 5.81 MIL/uL   Hemoglobin 12.7 (L) 13.0 - 17.0 g/dL   HCT 36.5 (L) 39.0 - 52.0 %   MCV 92.2 78.0 - 100.0 fL   MCH 32.1 26.0 - 34.0 pg   MCHC 34.8 30.0 - 36.0  g/dL   RDW 13.2 11.5 - 15.5 %   Platelets 139 (L) 150 - 400 K/uL    ABGS  Recent Labs  04/18/14 0346  PHART 7.439  PO2ART 153.0*  TCO2 19.7  HCO3 22.5   CULTURES Recent Results (from the past 240 hour(s))  MRSA PCR Screening     Status: Abnormal   Collection Time: 04/13/14  4:00 PM  Result Value Ref Range Status   MRSA by PCR POSITIVE (A) NEGATIVE Final    Comment:        The GeneXpert MRSA Assay (FDA approved for NASAL specimens only), is one component of a comprehensive MRSA colonization surveillance program. It is not intended to diagnose MRSA infection nor to guide or monitor treatment for MRSA infections. RESULT CALLED TO, READ BACK BY AND VERIFIED WITH:  CUMMINGS,R @ 2205 ON 04/13/14 BY WOODIE,J   Culture, blood (x 2)     Status: None (Preliminary result)   Collection Time: 04/16/14 10:06 AM  Result Value Ref Range Status   Specimen Description BLOOD PORTA CATH DRAWN BY RN  Final   Special Requests BOTTLES DRAWN AEROBIC AND ANAEROBIC [place]  Final   Culture  Final    GRAM POSITIVE COCCI IN CHAINS GRAM STAIN CALLED PREVIOUSLY TO MCDANIEL,M. AT 0700 BY RESSEGGER,R. Performed at [facility]    Report Status PENDING  Incomplete  Culture, blood (x 2)     Status: None (Preliminary result)   Collection Time: 04/16/14 10:16 AM  Result Value Ref Range Status   Specimen Description BLOOD  Final   Special Requests NONE  Final   Culture   Final    GRAM POSITIVE COCCI IN CHAINS Gram Stain Report Called to,Read Back By and Verified With: MISTY MCDANIEL RN ON [id number] AT 0700 BY RESSEGGER R Performed at [facility]    Report Status PENDING  Incomplete   Studies/Results: US Venous Img Lower Bilateral  04/16/2014   CLINICAL DATA:  Bilateral leg and foot swelling for 2 weeks. Evaluate for DVT. The patient is currently on the ventilator.  EXAM: BILATERAL LOWER EXTREMITY VENOUS DOPPLER ULTRASOUND  TECHNIQUE: Gray-scale sonography with graded compression, as  well as color Doppler and duplex ultrasound were performed to evaluate the lower extremity deep venous systems from the level of the common femoral vein and including the common femoral, femoral, profunda femoral, popliteal and calf veins including the posterior tibial, peroneal and gastrocnemius veins when visible. The superficial great saphenous vein was also interrogated. Spectral Doppler was utilized to evaluate flow at rest and with distal augmentation maneuvers in the common femoral, femoral and popliteal veins.  COMPARISON:  None.  FINDINGS: RIGHT LOWER EXTREMITY  Common Femoral Vein: No evidence of thrombus. Normal compressibility.  Saphenofemoral Junction: No evidence of thrombus. Normal compressibility.  Profunda Femoral Vein: No evidence of thrombus. Normal compressibility.  Femoral Vein: No evidence of thrombus.  Normal compressibility.  Popliteal Vein: No evidence of thrombus.  Normal compressibility.  Calf Veins: There is hypoechoic expansile occlusive thrombus within the posterior tibial vein (representative images 38 through 42).  Superficial Great Saphenous Vein: No evidence of thrombus. Normal compressibility and flow on color Doppler imaging.  Venous Reflux:  None.  Other Findings: A minimal on subcutaneous edema is noted at the level of the calf and lower leg.  LEFT LOWER EXTREMITY  Common Femoral Vein: No evidence of thrombus. Normal compressibility, respiratory phasicity and response to augmentation.  Saphenofemoral Junction: No evidence of thrombus. Normal compressibility.  Profunda Femoral Vein: No evidence of thrombus. Normal compressibility.  Femoral Vein: No evidence of thrombus.  Normal compressibility.  Popliteal Vein: No evidence of thrombus. Slow flow is demonstrated within the left popliteal vein (image 81) however the vessel remains normally compressible at this location (representative image 84).  Calf Veins: There is hypoechoic nonocclusive thrombus within 1 of the paired left  posterior tibial veins (image 99 through 101).  Superficial Great Saphenous Vein: No evidence of thrombus. Normal compressibility and flow on color Doppler imaging.  Venous Reflux:  None.  Other Findings: There is age-indeterminate occlusive superficial thrombophlebitis involving the lesser saphenous vein (Images 105 through portal 7). A minimal amount of subcutaneous edema is noted at the level of the left calf and ankle.  IMPRESSION: 1. Examination positive for DVT involving the right posterior tibial vein and one of the paired left posterior tibial veins, however there is no extension of this distal DVT to involve the more proximal deep venous systems of either lower extremity. 2. Examination is positive for age-indeterminate occlusive superficial thrombophlebitis involving the left lesser saphenous vein.   Electronically Signed   By: Sandi Mariscal M.D.   On: 04/16/2014 14:12   Dg  Chest Port 1 View  04/17/2014   ADDENDUM REPORT: 04/17/2014 08:14  ADDENDUM: PICC tip is in the superior vena cava at the level of the azygos vein, retracted since the prior study.   Electronically Signed   By: Lorriane Shire M.D.   On: 04/17/2014 08:14   04/17/2014   CLINICAL DATA:  Respiratory failure.  EXAM: PORTABLE CHEST - 1 VIEW  COMPARISON:  04/16/2014 and 04/15/2014 and 04/13/2014  FINDINGS: Endotracheal tube is 5.5 cm above the carina. NG tube tip is in the body of the stomach. Heart size and vascularity are normal considering the shallow inspiration. There is slight consolidation at the left lung base medially. Minimal atelectasis at the right lung base due to the shallow inspiration.  IMPRESSION: Focal area of consolidation at the left lung base, unchanged.  Electronically Signed: By: Lorriane Shire M.D. On: 04/17/2014 07:09   Dg Chest Port 1 View  04/16/2014   CLINICAL DATA:  PICC placement  EXAM: PORTABLE CHEST - 1 VIEW  COMPARISON:  04/15/2014  FINDINGS: New left upper extremity PICC, tip in the region of the upper  right atrium. An endotracheal tube tip remains at the clavicular heads. Gastric suction tube reaches the stomach.  Unchanged cardiopericardial enlargement. Increased pulmonary venous congestion. The retrocardiac lung remains largely obscured by soft tissue attenuation. No effusion or pneumothorax.  IMPRESSION: 1. New left upper extremity PICC, tip at the upper right atrium. 2. Cardiomegaly and increased pulmonary venous congestion.   Electronically Signed   By: Monte Fantasia M.D.   On: 04/16/2014 16:46    Medications:  Prior to Admission:  Prescriptions prior to admission  Medication Sig Dispense Refill Last Dose  . insulin detemir (LEVEMIR) 100 UNIT/ML injection Inject 60 Units into the skin daily.     04/12/2014 at Unknown time  . metFORMIN (GLUCOPHAGE) 500 MG tablet Take 500 mg by mouth daily.     04/12/2014 at Unknown time  . PRESCRIPTION MEDICATION Blood pressure med awaiting va clairfication      Scheduled: . antiseptic oral rinse  7 mL Mouth Rinse QID  . aspirin  81 mg Per Tube Daily  . chlorhexidine  15 mL Mouth Rinse BID  . feeding supplement (PRO-STAT SUGAR FREE 64)  30 mL Per Tube BID  . folic acid  1 mg Per Tube Daily  . insulin aspart  0-15 Units Subcutaneous 6 times per day  . insulin detemir  20 Units Subcutaneous Daily  . methylPREDNISolone (SOLU-MEDROL) injection  40 mg Intravenous Q24H  . multivitamin with minerals  1 tablet Per Tube Daily  . mupirocin ointment  1 application Nasal BID  . piperacillin-tazobactam (ZOSYN)  IV  3.375 g Intravenous Q8H  . sodium chloride  10-40 mL Intracatheter Q12H  . sodium chloride  3 mL Intravenous Q12H  . thiamine  100 mg Oral Daily   Or  . thiamine  100 mg Intravenous Daily  . [START ON 04/19/2014] vancomycin  2,000 mg Intravenous Q48H   Continuous: . dexmedetomidine 0.6 mcg/kg/hr (04/17/14 1550)  . dexmedetomidine 0.6 mcg/kg/hr (04/18/14 0738)  . DOBUTamine 2.5 mcg/kg/min (04/17/14 2238)  . feeding supplement (VITAL AF 1.2 CAL)  1,000 mL (04/17/14 2300)  . furosemide (LASIX) infusion 8 mg/hr (04/17/14 2239)  . heparin 900 Units/hr (04/18/14 0657)   WVP:XTGGYIRSW, diphenhydrAMINE, fentaNYL, fentaNYL, naphazoline, ondansetron **OR** ondansetron (ZOFRAN) IV, sodium chloride  Assesment: He has acute respiratory failure which is multi-factorial. He remains intubated and on the ventilator. He is on a number of continuous  infusions including dobutamine Precedex and Lasix. His oxygenation is better. He still has significant swelling of his tongue but I think it's about the same. I will increase his Solu-Medrol. I don't think he'll be able to come off the ventilator at this point.  He has DVT and he is on heparin. He very well may have pulmonary emboli as well but we can check that because of his renal dysfunction  He has acute renal failure and his renal function is improved  He is angioedema and if he doesn't respond well to Solu-Medrol we could try fresh frozen plasma. I don't think it's urgent to treat it with fresh frozen plasma today because I don't think we have any opportunity for extubation today  He has acute heart failure being managed by cardiology Principal Problem:   CHF exacerbation Active Problems:   Diabetes type 2, controlled   Angioedema   Tachycardia    Plan: Increase Solu-Medrol continue other treatments chest x-ray in the morning    LOS: 5 days   Romonia Yanik L 04/18/2014, 7:55 AM

## 2014-04-18 NOTE — Progress Notes (Addendum)
While on the floor after checking ventilator I heard the ventilator alarms going off.i came in room to find that the pt had extubated himself... I grab ambu bag to assist with breathing until ED Dr can come to reintubate..the patient reintubated with no problems and back on ventilator with same settings

## 2014-04-18 NOTE — Progress Notes (Signed)
[facility] PROGRESS NOTE  Gabriel Reynolds WUJ:[id number] DOB: 1955-05-22 DOA: 04/13/2014 PCP: Pcp Not In System  Assessment/Plan: Ventilatory dependent hypoxemic respiratory failure -Etiology likely a combination of decompensated CHF, angioedema and possibly DTs.  -Remains intubated today, with increasing angioedema not candidate for extubation today. -Appreciate Dr. Juanetta Gosling following. -Currently sedated on a Precedex drip  Acute combined systolic CHF -Remains on dobutamine and Lasix drips. -Appreciate cardiology following. -Is 4.6 L negative.  Sepsis with bacteremia -2 out of 2 cultures with gram-positive cocci in chains. -Continue vancomycin and Zosyn today. -Possible source appears to be pneumonia giving left base consolidation on chest x-ray.  Hospital-acquired pneumonia/aspiration pneumonia -See above for details.  Angioedema -Increased tongue swelling noted overnight. -Continue steroids/Benadryl.  Alcohol abuse -DTs may have played a role in his acute respiratory decompensation over the weekend. -Continue thiamine/folate.  Bilateral DVTs  -Continue heparin drip. -Unable to confirm PE at this point given patient's unstable illness and renal dysfunction.  Acute renal failure -Improving on dobutamine drip and Lasix, good urine output. -Have no records to determine chronicity of renal failure.  Code Status: Full code Family Communication: Wife [name] at bedside updated on plan of care  Disposition Plan: Keep in ICU   Consultants:  Cardiology  Pulmonary   Antibiotics:  Vancomycin  Zosyn   Subjective: Sedated on vent, opens eyes to voice  Objective: Filed Vitals:   04/18/14 0832 04/18/14 0900 04/18/14 0930 04/18/14 1000  BP:  105/71 120/77 92/44  Pulse:  67 108 39  Temp:      TempSrc:      Resp:  Height:  (1.854 m)     Weight:      SpO2:  96% 89% 94%    Intake/Output Summary (Last 24 hours) at 04/18/14 1026 Last data  filed at 04/18/14 0800  Gross per 24 hour  Intake 2770.56 ml  Output   5825 ml  Net -3054.44 ml   Filed Weights   04/16/14 0500 04/17/14 0500 04/18/14 0400  Weight: 138.7 kg (305 lb 12.5 oz) 138.2 kg (304 lb 10.8 oz) 140.2 kg (309 lb 1.4 oz)    Exam:   General:  Sedated  Cardiovascular: Regular rate and rhythm  Respiratory: Decreased left base sounds, no wheezes  Abdomen: Soft, nontender, nondistended, positive bowel sounds  Extremities: 3+ pitting edema bilaterally   Neurologic:  Sedated  Data Reviewed: Basic Metabolic Panel:  Recent Labs Lab 04/14/14 0427 04/15/14 0434 04/16/14 0900 04/17/14 0426 04/18/14 0450  NA 134* 133* 133* 134* 133*  K 4.4 4.0 5.0 4.1 4.1  CL 99 99 99 99 96  CO2 GLUCOSE 223* 79 154* 109* 271*  BUN 37* 47* 65* 77* 84*  CREATININE 2.33* 3.07* 4.26* 4.25* 3.46*  CALCIUM 9.1 9.6 8.9 9.0 8.7  MG  --  1.8 1.7 2.1  --    Liver Function Tests:  Recent Labs Lab 04/13/14 1545 04/14/14 0427 04/15/14 0434 04/16/14 0900  AST 61* 52* 43* 97*  ALT 54*  ALKPHOS 97 95 91 81  BILITOT 2.1* 1.8* 1.4* 1.8*  PROT 7.2 6.9 7.2 6.4  ALBUMIN 3.7 3.4* 3.7 3.1*    Recent Labs Lab 04/15/14 2000  LIPASE 25   No results for input(s): AMMONIA in the last 168 hours. CBC:  Recent Labs Lab 04/13/14 1110  04/16/14 0900 04/16/14 1006 04/17/14 0426 04/18/14 0032 04/18/14 0450  WBC 6.7  < > 8.1 DUPLICATE  9.1 7.7 8.6  NEUTROABS 4.3  --   --  6.4  --  6.4  --   HGB 13.2  < > 13.8 DUPLICATE 14.2 13.0 12.7*  HCT 38.9*  < > 41.6 DUPLICATE 40.8 37.5* 36.5*  MCV 94.0  < > 95.2 DUPLICATE 92.1 91.9 92.2  PLT 151  < > 157 DUPLICATE 121* 125* 139*  < > = values in this interval not displayed. Cardiac Enzymes:  Recent Labs Lab 04/13/14 1110 04/13/14 1545 04/13/14 2224 04/14/14 0427  TROPONINI 0.09* 0.08* 0.05* 0.04*   BNP (last 3 results)  Recent Labs  04/13/14 1110  BNP 617.0*    ProBNP (last 3 results) No results  for input(s): PROBNP in the last 8760 hours.  CBG:  Recent Labs Lab 04/17/14 1158 04/17/14 1622 04/17/14 1940 04/18/14 0239 04/18/14 0733  GLUCAP 117* 195* 225* 263* 284*    Recent Results (from the past 240 hour(s))  MRSA PCR Screening     Status: Abnormal   Collection Time: 04/13/14  4:00 PM  Result Value Ref Range Status   MRSA by PCR POSITIVE (A) NEGATIVE Final    Comment:        The GeneXpert MRSA Assay (FDA approved for NASAL specimens only), is one component of a comprehensive MRSA colonization surveillance program. It is not intended to diagnose MRSA infection nor to guide or monitor treatment for MRSA infections. RESULT CALLED TO, READ BACK BY AND VERIFIED WITH:  CUMMINGS,R @ 2205 ON 04/13/14 BY WOODIE,J   Culture, blood (x 2)     Status: None (Preliminary result)   Collection Time: 04/16/14 10:06 AM  Result Value Ref Range Status   Specimen Description BLOOD PORTA CATH DRAWN BY RN  Final   Special Requests BOTTLES DRAWN AEROBIC AND ANAEROBIC 6CC  Final   Culture   Final    GRAM POSITIVE COCCI IN CHAINS Note: Gram Stain Report Called to,Read Back By and Verified With: MCDANIEL M. AT 0700 BY [email or web address].R Performed at [facility] Performed at [facility]    Report Status PENDING  Incomplete  Culture, blood (x 2)     Status: None (Preliminary result)   Collection Time: 04/16/14 10:16 AM  Result Value Ref Range Status   Specimen Description BLOOD RIGHT ANTECUBITAL  Final   Special Requests BOTTLES DRAWN AEROBIC AND ANAEROBIC 10CC  Final   Culture   Final    GRAM POSITIVE COCCI IN CHAINS Note: Gram Stain Report Called to,Read Back By and Verified With: MISTY MCDANIEL RN 04/17/14 0700 BY RESSEGGER R Performed at [facility] Performed at [facility]    Report Status PENDING  Incomplete     Studies: [place] Venous Img Lower Bilateral  04/16/2014   CLINICAL DATA:  Bilateral leg and foot swelling for 2 weeks. Evaluate for DVT. The  patient is currently on the ventilator.  EXAM: BILATERAL LOWER EXTREMITY VENOUS DOPPLER ULTRASOUND  TECHNIQUE: Gray-scale sonography with graded compression, as well as color Doppler and duplex ultrasound were performed to evaluate the lower extremity deep venous systems from the level of the common femoral vein and including the common femoral, femoral, profunda femoral, popliteal and calf veins including the posterior tibial, peroneal and gastrocnemius veins when visible. The superficial great saphenous vein was also interrogated. Spectral Doppler was utilized to evaluate flow at rest and with distal augmentation maneuvers in the common femoral, femoral and popliteal veins.  COMPARISON:  None.  FINDINGS: RIGHT LOWER EXTREMITY  Common Femoral Vein: No evidence  of thrombus. Normal compressibility.  Saphenofemoral Junction: No evidence of thrombus. Normal compressibility.  Profunda Femoral Vein: No evidence of thrombus. Normal compressibility.  Femoral Vein: No evidence of thrombus.  Normal compressibility.  Popliteal Vein: No evidence of thrombus.  Normal compressibility.  Calf Veins: There is hypoechoic expansile occlusive thrombus within the posterior tibial vein (representative images 38 through 42).  Superficial Great Saphenous Vein: No evidence of thrombus. Normal compressibility and flow on color Doppler imaging.  Venous Reflux:  None.  Other Findings: A minimal on subcutaneous edema is noted at the level of the calf and lower leg.  LEFT LOWER EXTREMITY  Common Femoral Vein: No evidence of thrombus. Normal compressibility, respiratory phasicity and response to augmentation.  Saphenofemoral Junction: No evidence of thrombus. Normal compressibility.  Profunda Femoral Vein: No evidence of thrombus. Normal compressibility.  Femoral Vein: No evidence of thrombus.  Normal compressibility.  Popliteal Vein: No evidence of thrombus. Slow flow is demonstrated within the left popliteal vein (image 81) however the vessel  remains normally compressible at this location (representative image 84).  Calf Veins: There is hypoechoic nonocclusive thrombus within 1 of the paired left posterior tibial veins (image 99 through 101).  Superficial Great Saphenous Vein: No evidence of thrombus. Normal compressibility and flow on color Doppler imaging.  Venous Reflux:  None.  Other Findings: There is age-indeterminate occlusive superficial thrombophlebitis involving the lesser saphenous vein (Images 105 through portal 7). A minimal amount of subcutaneous edema is noted at the level of the left calf and ankle.  IMPRESSION: 1. Examination positive for DVT involving the right posterior tibial vein and one of the paired left posterior tibial veins, however there is no extension of this distal DVT to involve the more proximal deep venous systems of either lower extremity. 2. Examination is positive for age-indeterminate occlusive superficial thrombophlebitis involving the left lesser saphenous vein.   Electronically Signed   By: Simonne Come M.D.   On: 04/16/2014 14:12   Dg Chest Port 1 View  04/18/2014   CLINICAL DATA:  59 year old male with PICC line placement. Subsequent encounter.  EXAM: PORTABLE CHEST - 1 VIEW  COMPARISON:  04/17/2014.  FINDINGS: Left-sided PICC line is in place. The tip is difficult to adequately assess but appears to be at the level of formation of the superior vena cava.  Endotracheal tube tip is 6.5 cm above the carina.  Nasogastric tube courses below the diaphragm. Tip is not included on the present exam.  Cardiomegaly.  Pulmonary vascular prominence most notable centrally.  Slightly limited evaluation of the lung bases secondary to degree of inspiration.  Tortuous aorta.  Bilateral acromioclavicular joint degenerative changes.  IMPRESSION: Left-sided PICC line is in place. The tip is difficult to adequately assess but appears to be at the level of formation of the superior vena cava.  Cardiomegaly.  Pulmonary vascular  prominence most notable centrally.   Electronically Signed   By: Lacy Duverney M.D.   On: 04/18/2014 10:22   Dg Chest Port 1 View  04/17/2014   ADDENDUM REPORT: 04/17/2014 08:14  ADDENDUM: PICC tip is in the superior vena cava at the level of the azygos vein, retracted since the prior study.   Electronically Signed   By: Francene Boyers M.D.   On: 04/17/2014 08:14   04/17/2014   CLINICAL DATA:  Respiratory failure.  EXAM: PORTABLE CHEST - 1 VIEW  COMPARISON:  04/16/2014 and 04/15/2014 and 04/13/2014  FINDINGS: Endotracheal tube is 5.5 cm above the carina. NG tube tip  is in the body of the stomach. Heart size and vascularity are normal considering the shallow inspiration. There is slight consolidation at the left lung base medially. Minimal atelectasis at the right lung base due to the shallow inspiration.  IMPRESSION: Focal area of consolidation at the left lung base, unchanged.  Electronically Signed: By: Francene Boyers M.D. On: 04/17/2014 07:09   Dg Chest Port 1 View  04/16/2014   CLINICAL DATA:  PICC placement  EXAM: PORTABLE CHEST - 1 VIEW  COMPARISON:  04/15/2014  FINDINGS: New left upper extremity PICC, tip in the region of the upper right atrium. An endotracheal tube tip remains at the clavicular heads. Gastric suction tube reaches the stomach.  Unchanged cardiopericardial enlargement. Increased pulmonary venous congestion. The retrocardiac lung remains largely obscured by soft tissue attenuation. No effusion or pneumothorax.  IMPRESSION: 1. New left upper extremity PICC, tip at the upper right atrium. 2. Cardiomegaly and increased pulmonary venous congestion.   Electronically Signed   By: Marnee Spring M.D.   On: 04/16/2014 16:46    Scheduled Meds: . antiseptic oral rinse  7 mL Mouth Rinse QID  . aspirin  81 mg Per Tube Daily  . chlorhexidine  15 mL Mouth Rinse BID  . feeding supplement (PRO-STAT SUGAR FREE 64)  30 mL Per Tube BID  . folic acid  1 mg Per Tube Daily  . insulin aspart  0-15  Units Subcutaneous 6 times per day  . insulin detemir  20 Units Subcutaneous Daily  . methylPREDNISolone (SOLU-MEDROL) injection  40 mg Intravenous Q12H  . multivitamin with minerals  1 tablet Per Tube Daily  . mupirocin ointment  1 application Nasal BID  . piperacillin-tazobactam (ZOSYN)  IV  3.375 g Intravenous Q8H  . sodium chloride  10-40 mL Intracatheter Q12H  . sodium chloride  10-40 mL Intracatheter Q12H  . sodium chloride  3 mL Intravenous Q12H  . thiamine  100 mg Oral Daily   Or  . thiamine  100 mg Intravenous Daily  . [START ON 04/19/2014] vancomycin  2,000 mg Intravenous Q48H   Continuous Infusions: . dexmedetomidine 0.7 mcg/kg/hr (04/18/14 0933)  . dexmedetomidine 0.6 mcg/kg/hr (04/18/14 0738)  . DOBUTamine 2.5 mcg/kg/min (04/17/14 2238)  . feeding supplement (VITAL AF 1.2 CAL) 1,000 mL (04/17/14 2300)  . furosemide (LASIX) infusion 8 mg/hr (04/17/14 2239)  . heparin 900 Units/hr (04/18/14 0657)    Principal Problem:   CHF exacerbation Active Problems:   Diabetes type 2, controlled   Angioedema   Tachycardia    Time spent: 50 minutes. Greater than 50% of this time was spent in direct contact with the patient coordinating care.    Chaya Jan  [facility] Pager [phone]  If 7PM-7AM, please contact night-coverage at [email or web address], password [facility] 04/18/2014, 10:26 AM  LOS: 5 days

## 2014-04-18 NOTE — Evaluation (Signed)
Received call about worsened tongue swelling in setting of baseline angioedema among several other medical issues. Currently intubated. Oxygenating well. BP stable. On solumedrol 40 mg daily.   Will given solumedrol 125 mg IV x 1 Continue to follow

## 2014-04-18 NOTE — Progress Notes (Signed)
Peripherally Inserted Central Catheter/Midline Placement  The IV Nurse has discussed with the patient and/or persons authorized to consent for the patient, the purpose of this procedure and the potential benefits and risks involved with this procedure.  The benefits include less needle sticks, lab draws from the catheter and patient may be discharged home with the catheter.  Risks include, but not limited to, infection, bleeding, blood clot (thrombus formation), and puncture of an artery; nerve damage and irregular heat beat.  Alternatives to this procedure were also discussed.  PICC/Midline Placement Documentation  PICC Triple Lumen 04/18/14 PICC Left Basilic 45 cm 0 cm (Active)  Indication for Insertion or Continuance of Line Limited venous access - need for IV therapy >5 days (PICC only) 04/18/2014  4:00 PM  Exposed Catheter (cm) 0 cm 04/18/2014  4:00 PM  Site Assessment Clean;Dry;Intact;Bruised 04/18/2014  4:00 PM  Lumen #1 Status Flushed;Saline locked;Blood return noted 04/18/2014  4:00 PM  Lumen #2 Status Flushed;Saline locked;Blood return noted 04/18/2014  4:00 PM  Lumen #3 Status Flushed;Saline locked;Blood return noted 04/18/2014  4:00 PM  Dressing Type Transparent 04/18/2014  4:00 PM  Dressing Status Clean;Dry;Intact 04/18/2014  4:00 PM  Line Care Connections checked and tightened 04/18/2014  4:00 PM  Dressing Intervention New dressing 04/18/2014  4:00 PM  Dressing Change Due 04/25/14 04/18/2014  4:00 PM   This was a PICC Exchange, not a new PICC insertion.   Trenton GammonWitherspoon, Darvin Dials S 04/18/2014, 4:09 PM

## 2014-04-19 ENCOUNTER — Inpatient Hospital Stay (HOSPITAL_COMMUNITY): Payer: Medicare Other

## 2014-04-19 DIAGNOSIS — Z9911 Dependence on respirator [ventilator] status: Secondary | ICD-10-CM

## 2014-04-19 DIAGNOSIS — F10231 Alcohol dependence with withdrawal delirium: Secondary | ICD-10-CM

## 2014-04-19 LAB — BLOOD GAS, ARTERIAL
ACID-BASE EXCESS: 3 mmol/L — AB (ref 0.0–2.0)
BICARBONATE: 26.6 meq/L — AB (ref 20.0–24.0)
Drawn by: 21310
FIO2: 40 %
MECHVT: 700 mL
O2 Saturation: 97.5 %
PEEP: 5 cmH2O
PH ART: 7.461 — AB (ref 7.350–7.450)
Patient temperature: 37
RATE: 12 resp/min
TCO2: 23.4 mmol/L (ref 0–100)
pCO2 arterial: 37.8 mmHg (ref 35.0–45.0)
pO2, Arterial: 103 mmHg — ABNORMAL HIGH (ref 80.0–100.0)

## 2014-04-19 LAB — HEPARIN LEVEL (UNFRACTIONATED)
Heparin Unfractionated: 0.26 [IU]/mL — ABNORMAL LOW (ref 0.30–0.70)
Heparin Unfractionated: 0.34 [IU]/mL (ref 0.30–0.70)

## 2014-04-19 LAB — GLUCOSE, CAPILLARY
GLUCOSE-CAPILLARY: 308 mg/dL — AB (ref 70–99)
Glucose-Capillary: 327 mg/dL — ABNORMAL HIGH (ref 70–99)
Glucose-Capillary: 321 mg/dL — ABNORMAL HIGH (ref 70–99)
Glucose-Capillary: 323 mg/dL — ABNORMAL HIGH (ref 70–99)
Glucose-Capillary: 342 mg/dL — ABNORMAL HIGH (ref 70–99)
Glucose-Capillary: 296 mg/dL — ABNORMAL HIGH (ref 70–99)
Glucose-Capillary: 289 mg/dL — ABNORMAL HIGH (ref 70–99)
Glucose-Capillary: 274 mg/dL — ABNORMAL HIGH (ref 70–99)

## 2014-04-19 LAB — CULTURE, BLOOD (ROUTINE X 2)

## 2014-04-19 LAB — PROTHROMBIN GENE MUTATION

## 2014-04-19 LAB — PROTEIN C, TOTAL: Protein C, Total: 67 % — ABNORMAL LOW (ref 70–140)

## 2014-04-19 MED ORDER — ROCURONIUM BROMIDE 50 MG/5ML IV SOLN
INTRAVENOUS | Status: AC
  Filled 2014-04-19: qty 2

## 2014-04-19 MED ORDER — SUCCINYLCHOLINE CHLORIDE 20 MG/ML IJ SOLN
INTRAMUSCULAR | Status: AC
  Administered 2014-04-18: 125 mg
  Filled 2014-04-19: qty 1

## 2014-04-19 MED ORDER — DOBUTAMINE IN D5W 4-5 MG/ML-% IV SOLN
2.5000 ug/kg/min | INTRAVENOUS | Status: DC
  Administered 2014-04-19: 2.5 ug/kg/min via INTRAVENOUS

## 2014-04-19 MED ORDER — LORAZEPAM 2 MG/ML IJ SOLN
2.0000 mg | INTRAMUSCULAR | Status: AC
  Administered 2014-04-19: 2 mg via INTRAVENOUS
  Filled 2014-04-19: qty 1

## 2014-04-19 MED ORDER — DEXMEDETOMIDINE HCL IN NACL 200 MCG/50ML IV SOLN
INTRAVENOUS | Status: AC
  Filled 2014-04-19: qty 100

## 2014-04-19 MED ORDER — FAMOTIDINE IN NACL 20-0.9 MG/50ML-% IV SOLN
20.0000 mg | INTRAVENOUS | Status: DC
  Administered 2014-04-19 – 2014-04-25 (×7): 20 mg via INTRAVENOUS
  Filled 2014-04-19 (×8): qty 50

## 2014-04-19 MED ORDER — DEXTROSE 5 % IV SOLN
1.0000 g | INTRAVENOUS | Status: DC
  Administered 2014-04-19: 1 g via INTRAVENOUS
  Filled 2014-04-19 (×4): qty 10

## 2014-04-19 MED ORDER — DEXMEDETOMIDINE HCL IN NACL 200 MCG/50ML IV SOLN
0.0000 ug/kg/h | INTRAVENOUS | Status: AC
  Administered 2014-04-19: 1 ug/kg/h via INTRAVENOUS
  Administered 2014-04-19 (×2): 1.1 ug/kg/h via INTRAVENOUS
  Administered 2014-04-19: 0.9 ug/kg/h via INTRAVENOUS
  Administered 2014-04-19: 1.1 ug/kg/h via INTRAVENOUS
  Administered 2014-04-19: 1.11 ug/kg/h via INTRAVENOUS
  Administered 2014-04-19: 0.9 ug/kg/h via INTRAVENOUS
  Administered 2014-04-19 – 2014-04-20 (×6): 1.1 ug/kg/h via INTRAVENOUS
  Administered 2014-04-20: 0.9 ug/kg/h via INTRAVENOUS
  Administered 2014-04-20: 1.1 ug/kg/h via INTRAVENOUS
  Administered 2014-04-20: 1.2 ug/kg/h via INTRAVENOUS
  Administered 2014-04-20 (×2): 1.1 ug/kg/h via INTRAVENOUS
  Administered 2014-04-20: 1 ug/kg/h via INTRAVENOUS
  Administered 2014-04-20 (×2): 1.1 ug/kg/h via INTRAVENOUS
  Administered 2014-04-20: 1.2 ug/kg/h via INTRAVENOUS
  Administered 2014-04-20: 1.1 ug/kg/h via INTRAVENOUS
  Administered 2014-04-20: 1.2 ug/kg/h via INTRAVENOUS
  Administered 2014-04-20: 1.1 ug/kg/h via INTRAVENOUS
  Administered 2014-04-21 (×7): 0.9 ug/kg/h via INTRAVENOUS
  Administered 2014-04-21: 0.908 ug/kg/h via INTRAVENOUS
  Administered 2014-04-21 (×2): 0.9 ug/kg/h via INTRAVENOUS
  Administered 2014-04-21: 0.908 ug/kg/h via INTRAVENOUS
  Administered 2014-04-21: 0.9 ug/kg/h via INTRAVENOUS
  Filled 2014-04-19 (×2): qty 50
  Filled 2014-04-19 (×3): qty 100
  Filled 2014-04-19: qty 50
  Filled 2014-04-19: qty 200
  Filled 2014-04-19: qty 100
  Filled 2014-04-19 (×2): qty 50
  Filled 2014-04-19: qty 100
  Filled 2014-04-19 (×14): qty 50

## 2014-04-19 MED ORDER — LIDOCAINE HCL (CARDIAC) 20 MG/ML IV SOLN
INTRAVENOUS | Status: AC
Start: 2014-04-19 — End: 2014-04-19
  Filled 2014-04-19: qty 5

## 2014-04-19 MED ORDER — ETOMIDATE 2 MG/ML IV SOLN
INTRAVENOUS | Status: AC
  Administered 2014-04-18: 20 mg
  Filled 2014-04-19: qty 20

## 2014-04-19 NOTE — Progress Notes (Addendum)
Inpatient Diabetes Program Recommendations  AACE/ADA: New Consensus Statement on Inpatient Glycemic Control (2013)  Target Ranges:  Prepandial:   less than 140 mg/dL      Peak postprandial:   less than 180 mg/dL (1-2 hours)      Critically ill patients:  140 - 180 mg/dL   Results for Geri SeminoleDAMS, Yehuda E (MRN [id number]) as of 04/19/2014 09:10  Ref. Range 04/18/2014 02:39 04/18/2014 07:33 04/18/2014 11:05 04/18/2014 16:09 04/18/2014 19:58 04/18/2014 23:51 04/19/2014 04:06 04/19/2014 07:40  Glucose-Capillary Latest Range: 70-99 mg/dL 132263 (H) 440284 (H) 102381 (H) 327 (H) 321 (H) 308 (H) 323 (H) 342 (H)   Diabetes history: DM2 Outpatient Diabetes medications: Levemir 60 units daily, Metformin 500 mg daily Current orders for Inpatient glycemic control: Levemir 20 units daily, Novolog 0-15 units Q4H  Inpatient Diabetes Program Recommendations Insulin - Basal: Patient received a total of Novolog 54 units for correction on 3/23 and patient has already received a total of Novolog 33 units so far today for correction. Please consider increasing Levemir to 40 units daily starting today. If Levemir is increased as requested after patient has receieved current dose of Levemir 20 units then recommend ordering an additional one time dose of Levemir 20 this am so patient will get a total of 40 units today.  Insulin - Meal Coverage: Please consider ordering Novolog 3 units Q4H for tube feeding coverage.  Thanks, Orlando PennerMarie Jaimes Eckert, RN, MSN, CCRN, CDE Diabetes Coordinator Inpatient Diabetes Program [phone] (Team Pager from 8am to 5pm) [phone] ([facility] office) [phone] [facility] office)

## 2014-04-19 NOTE — Progress Notes (Signed)
[facility] PROGRESS NOTE  Geri SeminoleRalph E Huelsmann ZOX:[id number] DOB: 04-03-1955 DOA: 04/13/2014 PCP: Pcp Not In System  Assessment/Plan: Ventilatory dependent hypoxemic respiratory failure -Etiology likely a combination of decompensated CHF, angioedema and possibly DTs.  -Self extubated overnight requiring re-intubation. -Remains intubated today, with increasing angioedema not candidate for extubation today. -Appreciate Dr. Juanetta GoslingHawkins following.  Acute combined systolic CHF -Remains on dobutamine and Lasix drips. -Appreciate cardiology following. -Is 10.7 L negative. -Will attempt to wean dobutamine today if SBP allows.  Sepsis with bacteremia -2 out of 2 cultures with strep viridans. -Will narrow antibiotics to rocephin. -Possible source appears to be pneumonia giving left base consolidation on chest x-ray.  Hospital-acquired pneumonia/aspiration pneumonia -See above for details.  Angioedema -Increased tongue swelling noted overnight. -Continue steroids/Benadryl. -Presumed a late hypersensitivity reaction to ACE-I.  Alcohol abuse -DTs may have played a role in his acute respiratory decompensation over the weekend. -Continue thiamine/folate.  Bilateral DVTs  -Continue heparin drip. -Unable to confirm PE at this point given patient's unstable illness and renal dysfunction.  Acute renal failure -Improving on dobutamine drip and Lasix, good urine output. -Have no records to determine chronicity of renal failure. -Cr down to 61.46 3/24.  Code Status: Full code Family Communication: Wife [name] at bedside updated on plan of care  Disposition Plan: Keep in ICU   Consultants:  Cardiology  Pulmonary   Antibiotics:  Rocephin  Subjective: Awake, calm, makes eye contact. Remains on ventilator  Objective: Filed Vitals:   04/19/14 1315 04/19/14 1330 04/19/14 1345 04/19/14 1400  BP: 135/101 142/101 129/91 155/87  Pulse: 142 143 139   Temp:      TempSrc:        Resp: 12 12 15 15   Height:      Weight:      SpO2: 100% 100%      Intake/Output Summary (Last 24 hours) at 04/19/14 1516 Last data filed at 04/19/14 1433  Gross per 24 hour  Intake 2239.8 ml  Output   7550 ml  Net -5310.2 ml   Filed Weights   04/17/14 0500 04/18/14 0400 04/19/14 0500  Weight: 138.2 kg (304 lb 10.8 oz) 140.2 kg (309 lb 1.4 oz) 130.3 kg (287 lb 4.2 oz)    Exam:   General:  awake  Cardiovascular: Regular rate and rhythm  Respiratory: Decreased left base sounds, no wheezes  Abdomen: Soft, nontender, nondistended, positive bowel sounds  Extremities: 3+ pitting edema bilaterally   Neurologic:  Moves all 4 spontaneously  Data Reviewed: Basic Metabolic Panel:  Recent Labs Lab 04/14/14 0427 04/15/14 0434 04/16/14 0900 04/17/14 0426 04/18/14 0450  NA 134* 133* 133* 134* 133*  K 4.4 4.0 5.0 4.1 4.1  CL 99 99 99 99 96  CO2 25 22 22 20 23   GLUCOSE 223* 79 154* 109* 271*  BUN 37* 47* 65* 77* 84*  CREATININE 2.33* 3.07* 4.26* 4.25* 3.46*  CALCIUM 9.1 9.6 8.9 9.0 8.7  MG  --  1.8 1.7 2.1  --    Liver Function Tests:  Recent Labs Lab 04/13/14 1545 04/14/14 0427 04/15/14 0434 04/16/14 0900  AST 61* 52* 43* 97*  ALT 30 30 28  54*  ALKPHOS 97 95 91 81  BILITOT 2.1* 1.8* 1.4* 1.8*  PROT 7.2 6.9 7.2 6.4  ALBUMIN 3.7 3.4* 3.7 3.1*    Recent Labs Lab 04/15/14 2000  LIPASE 25   No results for input(s): AMMONIA in the last 168 hours. CBC:  Recent Labs Lab 04/13/14 1110  04/16/14  0900 04/16/14 1006 04/17/14 0426 04/18/14 0032 04/18/14 0450  WBC 6.7  < > 8.1 DUPLICATE 9.1 7.7 8.6  NEUTROABS 4.3  --   --  6.4  --  6.4  --   HGB 13.2  < > 13.8 DUPLICATE 14.2 13.0 12.7*  HCT 38.9*  < > 41.6 DUPLICATE 40.8 37.5* 36.5*  MCV 94.0  < > 95.2 DUPLICATE 92.1 91.9 92.2  PLT 151  < > 157 DUPLICATE 121* 125* 139*  < > = values in this interval not displayed. Cardiac Enzymes:  Recent Labs Lab 04/13/14 1110 04/13/14 1545 04/13/14 2224  04/14/14 0427  TROPONINI 0.09* 0.08* 0.05* 0.04*   BNP (last 3 results)  Recent Labs  04/13/14 1110  BNP 617.0*    ProBNP (last 3 results) No results for input(s): PROBNP in the last 8760 hours.  CBG:  Recent Labs Lab 04/18/14 1609 04/18/14 1958 04/18/14 2351 04/19/14 0406 04/19/14 0740  GLUCAP 327* 321* 308* 323* 342*    Recent Results (from the past 240 hour(s))  MRSA PCR Screening     Status: Abnormal   Collection Time: 04/13/14  4:00 PM  Result Value Ref Range Status   MRSA by PCR POSITIVE (A) NEGATIVE Final    Comment:        The GeneXpert MRSA Assay (FDA approved for NASAL specimens only), is one component of a comprehensive MRSA colonization surveillance program. It is not intended to diagnose MRSA infection nor to guide or monitor treatment for MRSA infections. RESULT CALLED TO, READ BACK BY AND VERIFIED WITH:  CUMMINGS,R @ 2205 ON 04/13/14 BY WOODIE,J   Culture, blood (x 2)     Status: None   Collection Time: 04/16/14 10:06 AM  Result Value Ref Range Status   Specimen Description BLOOD PORTA CATH DRAWN BY RN  Final   Special Requests BOTTLES DRAWN AEROBIC AND ANAEROBIC 6CC  Final   Culture   Final    VIRIDANS STREPTOCOCCUS Note: Gram Stain Report Called to,Read Back By and Verified With: MCDANIEL M. AT 0700 BY [email or web address].R Performed at [facility] Performed at [facility]    Report Status 04/19/2014 FINAL  Final   Organism ID, Bacteria VIRIDANS STREPTOCOCCUS  Final      Susceptibility   Viridans streptococcus - MIC (ETEST)*    PENICILLIN .064 SENSITIVE Sensitive     * VIRIDANS STREPTOCOCCUS  Culture, blood (x 2)     Status: None   Collection Time: 04/16/14 10:16 AM  Result Value Ref Range Status   Specimen Description BLOOD RIGHT ANTECUBITAL  Final   Special Requests BOTTLES DRAWN AEROBIC AND ANAEROBIC 10CC  Final   Culture   Final    VIRIDANS STREPTOCOCCUS Note: SUSCEPTIBILITIES PERFORMED ON PREVIOUS CULTURE WITHIN THE LAST  5 DAYS. Note: Gram Stain Report Called to,Read Back By and Verified With: MISTY MCDANIEL RN 04/17/14 0700 BY RESSEGGER R Performed at [facility] Performed at [facility]    Report Status 04/19/2014 FINAL  Final     Studies: Dg Chest 1 View  04/19/2014   CLINICAL DATA:  Endotracheal and NG tube placement. Patient pulled endotracheal tube.  EXAM: CHEST  1 VIEW  COMPARISON:  Chest radiograph earlier this day at 1619 hour  FINDINGS: The endotracheal tube is high, 12.9 cm from the carina above the thoracic inlet. Advancement of at least 5 cm recommended. Enteric tube is in place, tip and side port below the diaphragm. Lung volumes remain low. Left upper extremity PICC remains in  place in the proximal SVC. Cardiomegaly, vascular congestion, and bibasilar atelectasis are unchanged from prior. No large pleural effusion or pneumothorax.  IMPRESSION: Endotracheal tube is high in position, 12.9 cm from the carina. Advancement of at least 5 cm recommended.   Electronically Signed   By: Rubye Oaks M.D.   On: 04/19/2014 00:13   Dg Chest Port 1 View  04/19/2014   CLINICAL DATA:  Difficult intubation, endotracheal tube placement  EXAM: PORTABLE CHEST - 1 VIEW  COMPARISON:  04/18/2014 at 2330 hour  FINDINGS: The endotracheal tube has been advanced and is now 8 cm from the carina. Enteric tube and left upper extremity PICC remain in place. Cardiomegaly, mild vascular congestion, and bibasilar atelectasis are unchanged.  IMPRESSION: Endotracheal tube has been advanced and is now 8 cm from the carina.   Electronically Signed   By: Rubye Oaks M.D.   On: 04/19/2014 01:28   Dg Chest Port 1 View  04/18/2014   CLINICAL DATA:  Left PICC line placement.  Intubated.  EXAM: PORTABLE CHEST - 1 VIEW  COMPARISON:  04/18/2014  FINDINGS: Left PICC line tip is obscured over the spine but appears to extend into the proximal SVC level. Exam is rotated to the left. Endotracheal to 10.2 cm above the carina. NG  tube in the stomach. Stable cardiomegaly with vascular congestion and basilar atelectasis. Low lung volumes persist. No enlarging effusion or pneumothorax.  IMPRESSION: Left PICC line tip proximal SVC level.  Stable chest exam.   Electronically Signed   By: Judie Petit.  Shick M.D.   On: 04/18/2014 16:47   Dg Chest Port 1 View  04/18/2014   CLINICAL DATA:  59 year old male with PICC line placement. Subsequent encounter.  EXAM: PORTABLE CHEST - 1 VIEW  COMPARISON:  04/17/2014.  FINDINGS: Left-sided PICC line is in place. The tip is difficult to adequately assess but appears to be at the level of formation of the superior vena cava.  Endotracheal tube tip is 6.5 cm above the carina.  Nasogastric tube courses below the diaphragm. Tip is not included on the present exam.  Cardiomegaly.  Pulmonary vascular prominence most notable centrally.  Slightly limited evaluation of the lung bases secondary to degree of inspiration.  Tortuous aorta.  Bilateral acromioclavicular joint degenerative changes.  IMPRESSION: Left-sided PICC line is in place. The tip is difficult to adequately assess but appears to be at the level of formation of the superior vena cava.  Cardiomegaly.  Pulmonary vascular prominence most notable centrally.   Electronically Signed   By: Lacy Duverney M.D.   On: 04/18/2014 10:22    Scheduled Meds: . antiseptic oral rinse  7 mL Mouth Rinse QID  . aspirin  81 mg Per Tube Daily  . chlorhexidine  15 mL Mouth Rinse BID  . famotidine (PEPCID) IV  20 mg Intravenous Q24H  . feeding supplement (PRO-STAT SUGAR FREE 64)  30 mL Per Tube BID  . folic acid  1 mg Per Tube Daily  . insulin aspart  0-15 Units Subcutaneous 6 times per day  . insulin detemir  20 Units Subcutaneous Daily  . lidocaine (cardiac) 100 mg/60ml      . methylPREDNISolone (SOLU-MEDROL) injection  40 mg Intravenous Q12H  . multivitamin with minerals  1 tablet Per Tube Daily  . piperacillin-tazobactam (ZOSYN)  IV  3.375 g Intravenous Q8H  .  rocuronium      . sodium chloride  10-40 mL Intracatheter Q12H  . sodium chloride  10-40 mL Intracatheter Q12H  .  sodium chloride  3 mL Intravenous Q12H  . thiamine  100 mg Oral Daily   Or  . thiamine  100 mg Intravenous Daily  . vancomycin  2,000 mg Intravenous Q48H   Continuous Infusions: . dexmedetomidine 1.1 mcg/kg/hr (04/19/14 1400)  . DOBUTamine Stopped (04/19/14 1433)  . feeding supplement (VITAL AF 1.2 CAL) 1,000 mL (04/19/14 1154)  . furosemide (LASIX) infusion 4 mg/hr (04/19/14 1400)  . heparin 900 Units/hr (04/19/14 1400)    Principal Problem:   Acute respiratory failure with hypoxemia Active Problems:   Diabetes type 2, controlled   Angioedema   Acute combined systolic and diastolic CHF, NYHA class 4   Delirium tremens   DVT (deep venous thrombosis)   Ventilator dependence   Severe sepsis    Time spent: 45 minutes. Greater than 50% of this time was spent in direct contact with the patient coordinating care.    Chaya Jan  [facility] Pager [phone]  If 7PM-7AM, please contact night-coverage at [email or web address], password [facility] 04/19/2014, 3:16 PM  LOS: 6 days

## 2014-04-19 NOTE — Progress Notes (Signed)
ANTICOAGULATION CONSULT NOTE  Pharmacy Consult for Heparin Indication: DVT  Allergies  Allergen Reactions  . Ace Inhibitors Swelling  . Other Hives    wool   Patient Measurements: Height: 6\' 1"  (185.4 cm) Weight: 287 lb 4.2 oz (130.3 kg) IBW/kg (Calculated) : 79.9 Heparin Dosing Weight: 111.4 kg  Vital Signs: Temp: 98.5 F (36.9 C) (03/24 0400) Temp Source: Axillary (03/24 0400) BP: 142/94 mmHg (03/24 0600) Pulse Rate: 137 (03/24 0600)  Labs:  Recent Labs  04/16/14 0900 04/16/14 1006  04/17/14 0426  04/18/14 0032 04/18/14 0450 04/18/14 1250 04/19/14 0420  HGB 13.8 DUPLICATE  --  14.2  --  13.0 12.7*  --   --   HCT 41.6 DUPLICATE  --  40.8  --  37.5* 36.5*  --   --   PLT 157 DUPLICATE  --  121*  --  125* 139*  --   --   APTT  --  31  --   --   --   --   --   --   --   LABPROT  --  19.1*  --  20.5*  --   --   --   --   --   INR  --  1.59*  --  1.74*  --   --   --   --   --   HEPARINUNFRC  --   --   < > 1.02*  < >  --  0.23* 0.71* 0.26*  CREATININE 4.26*  --   --  4.25*  --   --  3.46*  --   --   < > = values in this interval not displayed. Estimated Creatinine Clearance: 31.7 mL/min (by C-G formula based on Cr of 3.46).  Medical History: Past Medical History  Diagnosis Date  . Hypertension   . Diabetes mellitus    Medications:  Scheduled:  . antiseptic oral rinse  7 mL Mouth Rinse QID  . aspirin  81 mg Per Tube Daily  . chlorhexidine  15 mL Mouth Rinse BID  . feeding supplement (PRO-STAT SUGAR FREE 64)  30 mL Per Tube BID  . folic acid  1 mg Per Tube Daily  . insulin aspart  0-15 Units Subcutaneous 6 times per day  . insulin detemir  20 Units Subcutaneous Daily  . lidocaine (cardiac) 100 mg/425ml      . methylPREDNISolone (SOLU-MEDROL) injection  40 mg Intravenous Q12H  . multivitamin with minerals  1 tablet Per Tube Daily  . piperacillin-tazobactam (ZOSYN)  IV  3.375 g Intravenous Q8H  . rocuronium      . sodium chloride  10-40 mL Intracatheter Q12H  .  sodium chloride  10-40 mL Intracatheter Q12H  . sodium chloride  3 mL Intravenous Q12H  . thiamine  100 mg Oral Daily   Or  . thiamine  100 mg Intravenous Daily  . vancomycin  2,000 mg Intravenous Q48H   Assessment: 59 yo obese M with + BLE DVT.  He is currently intubated on Precedex and has impaired renal & liver function.   Heparin has not been stable despite multiple rate changes.  He is now below goal range.   +bleeding from PICC noted.    Goal of Therapy:  Heparin level 0.3-0.7 units/ml  Monitor platelets by anticoagulation protocol: Yes   Plan:  Increase Heparin infusion to 900 units/hr Recheck 6hr Heparin level  Heparin level and CBC daily while on Heparin  Elson ClanLilliston, Miyonna Ormiston Michelle 04/19/2014,8:02 AM  Addendum:  Repeat heparin level therapeutic.  Cont current rate.  F/U am labs.  Junita Push, PharmD, BCPS 04/19/2014@5 :10 PM

## 2014-04-19 NOTE — Progress Notes (Signed)
Upon getting medications for restlessness and pain for the patient and respiratory leaving the room prior to; the patient was found pulling out his ventilator with safety mitts intact.  The tube was pulled at 2305 on 04/18/14 with immediate bagging at 2306.  The patient was given Amidate 20 mg and Succinylcholine 125 mg at 2320 for re-inutabtion by Dr. Preston FleetingGlick at 23:22 with the glide-a-scope. The on-call Dr. Kathie RhodesS. Newton made aware of the situation immediately.

## 2014-04-19 NOTE — Progress Notes (Signed)
Subjective: Interval History: none.  Objective: Vital signs in last 24 hours: Temp:  [97.4 F (36.3 C)-98.5 F (36.9 C)] 98.5 F (36.9 C) (03/24 0400) Pulse Rate:  [39-140] 137 (03/24 0600) Resp:  [12-27] 13 (03/24 0600) BP: (81-154)/(35-114) 142/94 mmHg (03/24 0600) SpO2:  [86 %-100 %] 100 % (03/24 0600) FiO2 (%):  [40 %] 40 % (03/24 0733) Weight:  [130.3 kg (287 lb 4.2 oz)] 130.3 kg (287 lb 4.2 oz) (03/24 0500) Weight change: -9.9 kg (-21 lb 13.2 oz)  Intake/Output from previous day: 03/23 0701 - 03/24 0700 In: 2219.8 [I.V.:1022.8; NG/GT:1097; IV Piggyback:100] Out: 5800 [Urine:5800] Intake/Output this shift:   generally patient remains intubated. Chest: Decreased breath sounds bilaterally. Heart exam revealed regular rate and rhythm Abdomen: Distended Extremities: He has 2+ edema    Lab Results:  Recent Labs  04/18/14 0032 04/18/14 0450  WBC 7.7 8.6  HGB 13.0 12.7*  HCT 37.5* 36.5*  PLT 125* 139*   BMET:  Recent Labs  04/17/14 0426 04/18/14 0450  NA 134* 133*  K 4.1 4.1  CL 99 96  CO2 20 23  GLUCOSE 109* 271*  BUN 77* 84*  CREATININE 4.25* 3.46*  CALCIUM 9.0 8.7   No results for input(s): PTH in the last 72 hours. Iron Studies: No results for input(s): IRON, TIBC, TRANSFERRIN, FERRITIN in the last 72 hours.  Studies/Results: Dg Chest 1 View  04/19/2014   CLINICAL DATA:  Endotracheal and NG tube placement. Patient pulled endotracheal tube.  EXAM: CHEST  1 VIEW  COMPARISON:  Chest radiograph earlier this day at 1619 hour  FINDINGS: The endotracheal tube is high, 12.9 cm from the carina above the thoracic inlet. Advancement of at least 5 cm recommended. Enteric tube is in place, tip and side port below the diaphragm. Lung volumes remain low. Left upper extremity PICC remains in place in the proximal SVC. Cardiomegaly, vascular congestion, and bibasilar atelectasis are unchanged from prior. No large pleural effusion or pneumothorax.  IMPRESSION: Endotracheal  tube is high in position, 12.9 cm from the carina. Advancement of at least 5 cm recommended.   Electronically Signed   By: Rubye OaksMelanie  Ehinger M.D.   On: 04/19/2014 00:13   Dg Chest Port 1 View  04/19/2014   CLINICAL DATA:  Difficult intubation, endotracheal tube placement  EXAM: PORTABLE CHEST - 1 VIEW  COMPARISON:  04/18/2014 at 2330 hour  FINDINGS: The endotracheal tube has been advanced and is now 8 cm from the carina. Enteric tube and left upper extremity PICC remain in place. Cardiomegaly, mild vascular congestion, and bibasilar atelectasis are unchanged.  IMPRESSION: Endotracheal tube has been advanced and is now 8 cm from the carina.   Electronically Signed   By: Rubye OaksMelanie  Ehinger M.D.   On: 04/19/2014 01:28   Dg Chest Port 1 View  04/18/2014   CLINICAL DATA:  Left PICC line placement.  Intubated.  EXAM: PORTABLE CHEST - 1 VIEW  COMPARISON:  04/18/2014  FINDINGS: Left PICC line tip is obscured over the spine but appears to extend into the proximal SVC level. Exam is rotated to the left. Endotracheal to 10.2 cm above the carina. NG tube in the stomach. Stable cardiomegaly with vascular congestion and basilar atelectasis. Low lung volumes persist. No enlarging effusion or pneumothorax.  IMPRESSION: Left PICC line tip proximal SVC level.  Stable chest exam.   Electronically Signed   By: Judie PetitM.  Shick M.D.   On: 04/18/2014 16:47   Dg Chest Port 1 View  04/18/2014  CLINICAL DATA:  59 year old male with PICC line placement. Subsequent encounter.  EXAM: PORTABLE CHEST - 1 VIEW  COMPARISON:  04/17/2014.  FINDINGS: Left-sided PICC line is in place. The tip is difficult to adequately assess but appears to be at the level of formation of the superior vena cava.  Endotracheal tube tip is 6.5 cm above the carina.  Nasogastric tube courses below the diaphragm. Tip is not included on the present exam.  Cardiomegaly.  Pulmonary vascular prominence most notable centrally.  Slightly limited evaluation of the lung bases  secondary to degree of inspiration.  Tortuous aorta.  Bilateral acromioclavicular joint degenerative changes.  IMPRESSION: Left-sided PICC line is in place. The tip is difficult to adequately assess but appears to be at the level of formation of the superior vena cava.  Cardiomegaly.  Pulmonary vascular prominence most notable centrally.   Electronically Signed   By: Lacy Duverney M.D.   On: 04/18/2014 10:22    I have reviewed the patient's current medications.  Assessment/Plan: Problem #1 acute kidney injury: Presently his renal function seems to be improving. Problem #2 anasarca: Patient on dobutamine and also Lasix drip. He had 5800 mL of urine output and improving. Problem #3 liver cirrhosis Problem #4 severe combined systolic and diastolic dysfunction Problem #5 respiratory failure: Presently patient remained intubated Problem #6 possible sepsis: Patient on antibiotics.  Problem #7 angioedema Plan: We'll continue with IV Lasix. We'll check his basic metabolic panel in the morning.   LOS: 6 days   Ansar Skoda S 04/19/2014,7:54 AM

## 2014-04-19 NOTE — Progress Notes (Signed)
Subjective: He extubated himself last night and to be reintubated. He still has significant swelling of his tongue he has no other noted problems. He is still on Precedex. He is receiving tube feedings and tolerating fairly well.  Objective: Vital signs in last 24 hours: Temp:  [97.4 F (36.3 C)-98.5 F (36.9 C)] 98.5 F (36.9 C) (03/24 0400) Pulse Rate:  [39-140] 137 (03/24 0600) Resp:  [12-27] 13 (03/24 0600) BP: (81-154)/(35-114) 142/94 mmHg (03/24 0600) SpO2:  [86 %-100 %] 100 % (03/24 0600) FiO2 (%):  [40 %] 40 % (03/24 0425) Weight:  [130.3 kg (287 lb 4.2 oz)] 130.3 kg (287 lb 4.2 oz) (03/24 0500) Weight change: -9.9 kg (-21 lb 13.2 oz) Last BM Date: 04/14/14  Intake/Output from previous day: 03/23 0701 - 03/24 0700 In: 2219.8 [I.V.:1022.8; NG/GT:1097; IV Piggyback:100] Out: 5800 [Urine:5800]  PHYSICAL EXAM General appearance: Intubated sedated on mechanical ventilation Resp: rhonchi bilaterally Cardio: Regular but tachycardic at about 120 230 GI: soft, non-tender; bowel sounds normal; no masses,  no organomegaly Extremities: extremities normal, atraumatic, no cyanosis or edema  Lab Results:  Results for orders placed or performed during the hospital encounter of 04/13/14 (from the past 48 hour(s))  Glucose, capillary     Status: Abnormal   Collection Time: 04/17/14  7:25 AM  Result Value Ref Range   Glucose-Capillary 104 (H) 70 - 99 mg/dL   Comment 1 Notify RN    Comment 2 Document in Chart   Glucose, capillary     Status: Abnormal   Collection Time: 04/17/14 11:58 AM  Result Value Ref Range   Glucose-Capillary 117 (H) 70 - 99 mg/dL   Comment 1 Notify RN    Comment 2 Document in Chart   Heparin level (unfractionated)     Status: Abnormal   Collection Time: 04/17/14  1:34 PM  Result Value Ref Range   Heparin Unfractionated 1.04 (H) 0.30 - 0.70 IU/mL    Comment:        IF HEPARIN RESULTS ARE BELOW EXPECTED VALUES, AND PATIENT DOSAGE HAS BEEN  CONFIRMED, SUGGEST FOLLOW UP TESTING OF ANTITHROMBIN III LEVELS.   Glucose, capillary     Status: Abnormal   Collection Time: 04/17/14  4:22 PM  Result Value Ref Range   Glucose-Capillary 195 (H) 70 - 99 mg/dL  Glucose, capillary     Status: Abnormal   Collection Time: 04/17/14  7:40 PM  Result Value Ref Range   Glucose-Capillary 225 (H) 70 - 99 mg/dL  Heparin level (unfractionated)     Status: Abnormal   Collection Time: 04/17/14  9:00 PM  Result Value Ref Range   Heparin Unfractionated 1.10 (H) 0.30 - 0.70 IU/mL    Comment: RESULTS CONFIRMED BY MANUAL DILUTION        IF HEPARIN RESULTS ARE BELOW EXPECTED VALUES, AND PATIENT DOSAGE HAS BEEN CONFIRMED, SUGGEST FOLLOW UP TESTING OF ANTITHROMBIN III LEVELS.   CBC with Differential/Platelet     Status: Abnormal   Collection Time: 04/18/14 12:32 AM  Result Value Ref Range   WBC 7.7 4.0 - 10.5 K/uL   RBC 4.08 (L) 4.22 - 5.81 MIL/uL   Hemoglobin 13.0 13.0 - 17.0 g/dL   HCT 37.5 (L) 39.0 - 52.0 %   MCV 91.9 78.0 - 100.0 fL   MCH 31.9 26.0 - 34.0 pg   MCHC 34.7 30.0 - 36.0 g/dL   RDW 13.1 11.5 - 15.5 %   Platelets 125 (L) 150 - 400 K/uL   Neutrophils Relative % 83 (  H) 43 - 77 %   Neutro Abs 6.4 1.7 - 7.7 K/uL   Lymphocytes Relative 9 (L) 12 - 46 %   Lymphs Abs 0.7 0.7 - 4.0 K/uL   Monocytes Relative 8 3 - 12 %   Monocytes Absolute 0.6 0.1 - 1.0 K/uL   Eosinophils Relative 0 0 - 5 %   Eosinophils Absolute 0.0 0.0 - 0.7 K/uL   Basophils Relative 0 0 - 1 %   Basophils Absolute 0.0 0.0 - 0.1 K/uL  Glucose, capillary     Status: Abnormal   Collection Time: 04/18/14  2:39 AM  Result Value Ref Range   Glucose-Capillary 263 (H) 70 - 99 mg/dL  Blood gas, arterial     Status: Abnormal   Collection Time: 04/18/14  3:46 AM  Result Value Ref Range   FIO2 40.00 %   Delivery systems VENTILATOR    Mode PRESSURE REGULATED VOLUME CONTROL    VT 700 mL   Rate 12 resp/min   Peep/cpap 5.0 cm H20   pH, Arterial 7.439 7.350 - 7.450   pCO2  arterial 33.7 (L) 35.0 - 45.0 mmHg   pO2, Arterial 153.0 (H) 80.0 - 100.0 mmHg   Bicarbonate 22.5 20.0 - 24.0 mEq/L   TCO2 19.7 0 - 100 mmol/L   Acid-base deficit 1.1 0.0 - 2.0 mmol/L   O2 Saturation 98.9 %   Patient temperature 37.0    Collection site RIGHT RADIAL    Drawn by [id number]    Sample type ARTERIAL    Allens test (pass/fail) PASS PASS  Heparin level (unfractionated)     Status: Abnormal   Collection Time: 04/18/14  4:50 AM  Result Value Ref Range   Heparin Unfractionated 0.23 (L) 0.30 - 0.70 IU/mL    Comment:        IF HEPARIN RESULTS ARE BELOW EXPECTED VALUES, AND PATIENT DOSAGE HAS BEEN CONFIRMED, SUGGEST FOLLOW UP TESTING OF ANTITHROMBIN III LEVELS.   Basic metabolic panel     Status: Abnormal   Collection Time: 04/18/14  4:50 AM  Result Value Ref Range   Sodium 133 (L) 135 - 145 mmol/L   Potassium 4.1 3.5 - 5.1 mmol/L   Chloride 96 96 - 112 mmol/L   CO2 23 19 - 32 mmol/L   Glucose, Bld 271 (H) 70 - 99 mg/dL   BUN 84 (H) 6 - 23 mg/dL   Creatinine, Ser 3.46 (H) 0.50 - 1.35 mg/dL   Calcium 8.7 8.4 - 10.5 mg/dL   GFR calc non Af Amer 18 (L) >90 mL/min   GFR calc Af Amer 20 (L) >90 mL/min    Comment: (NOTE) The eGFR has been calculated using the CKD EPI equation. This calculation has not been validated in all clinical situations. eGFR's persistently <90 mL/min signify possible Chronic Kidney Disease.    Anion gap 14 5 - 15  CBC     Status: Abnormal   Collection Time: 04/18/14  4:50 AM  Result Value Ref Range   WBC 8.6 4.0 - 10.5 K/uL   RBC 3.96 (L) 4.22 - 5.81 MIL/uL   Hemoglobin 12.7 (L) 13.0 - 17.0 g/dL   HCT 36.5 (L) 39.0 - 52.0 %   MCV 92.2 78.0 - 100.0 fL   MCH 32.1 26.0 - 34.0 pg   MCHC 34.8 30.0 - 36.0 g/dL   RDW 13.2 11.5 - 15.5 %   Platelets 139 (L) 150 - 400 K/uL  Glucose, capillary     Status: Abnormal   Collection  Time: 04/18/14  7:33 AM  Result Value Ref Range   Glucose-Capillary 284 (H) 70 - 99 mg/dL   Comment 1 Notify RN   Glucose,  capillary     Status: Abnormal   Collection Time: 04/18/14 11:05 AM  Result Value Ref Range   Glucose-Capillary 381 (H) 70 - 99 mg/dL   Comment 1 Notify RN   Heparin level (unfractionated)     Status: Abnormal   Collection Time: 04/18/14 12:50 PM  Result Value Ref Range   Heparin Unfractionated 0.71 (H) 0.30 - 0.70 IU/mL    Comment:        IF HEPARIN RESULTS ARE BELOW EXPECTED VALUES, AND PATIENT DOSAGE HAS BEEN CONFIRMED, SUGGEST FOLLOW UP TESTING OF ANTITHROMBIN III LEVELS.   Glucose, capillary     Status: Abnormal   Collection Time: 04/18/14  4:09 PM  Result Value Ref Range   Glucose-Capillary 327 (H) 70 - 99 mg/dL  Glucose, capillary     Status: Abnormal   Collection Time: 04/18/14  7:58 PM  Result Value Ref Range   Glucose-Capillary 321 (H) 70 - 99 mg/dL   Comment 1 Notify RN   Glucose, capillary     Status: Abnormal   Collection Time: 04/18/14 11:51 PM  Result Value Ref Range   Glucose-Capillary 308 (H) 70 - 99 mg/dL   Comment 1 Notify RN   Glucose, capillary     Status: Abnormal   Collection Time: 04/19/14  4:06 AM  Result Value Ref Range   Glucose-Capillary 323 (H) 70 - 99 mg/dL   Comment 1 Notify RN   Heparin level (unfractionated)     Status: Abnormal   Collection Time: 04/19/14  4:20 AM  Result Value Ref Range   Heparin Unfractionated 0.26 (L) 0.30 - 0.70 IU/mL    Comment:        IF HEPARIN RESULTS ARE BELOW EXPECTED VALUES, AND PATIENT DOSAGE HAS BEEN CONFIRMED, SUGGEST FOLLOW UP TESTING OF ANTITHROMBIN III LEVELS.   Blood gas, arterial     Status: Abnormal   Collection Time: 04/19/14  4:35 AM  Result Value Ref Range   FIO2 40.00 %   Delivery systems VENTILATOR    Mode PRESSURE REGULATED VOLUME CONTROL    VT 700 mL   Rate 12.0 resp/min   Peep/cpap 5.0 cm H20   pH, Arterial 7.461 (H) 7.350 - 7.450   pCO2 arterial 37.8 35.0 - 45.0 mmHg   pO2, Arterial 103.0 (H) 80.0 - 100.0 mmHg   Bicarbonate 26.6 (H) 20.0 - 24.0 mEq/L   TCO2 23.4 0 - 100 mmol/L    Acid-Base Excess 3.0 (H) 0.0 - 2.0 mmol/L   O2 Saturation 97.5 %   Patient temperature 37.0    Collection site RIGHT RADIAL    Drawn by [id number]    Sample type ARTERIAL    Allens test (pass/fail) PASS PASS    ABGS  Recent Labs  04/19/14 0435  PHART 7.461*  PO2ART 103.0*  TCO2 23.4  HCO3 26.6*   CULTURES Recent Results (from the past 240 hour(s))  MRSA PCR Screening     Status: Abnormal   Collection Time: 04/13/14  4:00 PM  Result Value Ref Range Status   MRSA by PCR POSITIVE (A) NEGATIVE Final    Comment:        The GeneXpert MRSA Assay (FDA approved for NASAL specimens only), is one component of a comprehensive MRSA colonization surveillance program. It is not intended to diagnose MRSA infection nor to guide or monitor  treatment for MRSA infections. RESULT CALLED TO, READ BACK BY AND VERIFIED WITH:  CUMMINGS,R @ 2205 ON 04/13/14 BY WOODIE,J   Culture, blood (x 2)     Status: None (Preliminary result)   Collection Time: 04/16/14 10:06 AM  Result Value Ref Range Status   Specimen Description BLOOD PORTA CATH DRAWN BY RN  Final   Special Requests BOTTLES DRAWN AEROBIC AND ANAEROBIC 6CC  Final   Culture   Final    GRAM POSITIVE COCCI IN CHAINS Note: Gram Stain Report Called to,Read Back By and Verified With: [email or web address] Performed at [facility] Performed at [facility]    Report Status PENDING  Incomplete  Culture, blood (x 2)     Status: None (Preliminary result)   Collection Time: 04/16/14 10:16 AM  Result Value Ref Range Status   Specimen Description BLOOD RIGHT ANTECUBITAL  Final   Special Requests BOTTLES DRAWN AEROBIC AND ANAEROBIC 10CC  Final   Culture   Final    GRAM POSITIVE COCCI IN CHAINS Note: Gram Stain Report Called to,Read Back By and Verified With: MISTY MCDANIEL RN 04/17/14 0700 BY RESSEGGER R Performed at [facility] Performed at [facility]    Report Status PENDING  Incomplete    Studies/Results: Dg Chest 1 View  04/19/2014   CLINICAL DATA:  Endotracheal and NG tube placement. Patient pulled endotracheal tube.  EXAM: CHEST  1 VIEW  COMPARISON:  Chest radiograph earlier this day at 1619 hour  FINDINGS: The endotracheal tube is high, 12.9 cm from the carina above the thoracic inlet. Advancement of at least 5 cm recommended. Enteric tube is in place, tip and side port below the diaphragm. Lung volumes remain low. Left upper extremity PICC remains in place in the proximal SVC. Cardiomegaly, vascular congestion, and bibasilar atelectasis are unchanged from prior. No large pleural effusion or pneumothorax.  IMPRESSION: Endotracheal tube is high in position, 12.9 cm from the carina. Advancement of at least 5 cm recommended.   Electronically Signed   By: Jeb Levering M.D.   On: 04/19/2014 00:13   Dg Chest Port 1 View  04/19/2014   CLINICAL DATA:  Difficult intubation, endotracheal tube placement  EXAM: PORTABLE CHEST - 1 VIEW  COMPARISON:  04/18/2014 at 2330 hour  FINDINGS: The endotracheal tube has been advanced and is now 8 cm from the carina. Enteric tube and left upper extremity PICC remain in place. Cardiomegaly, mild vascular congestion, and bibasilar atelectasis are unchanged.  IMPRESSION: Endotracheal tube has been advanced and is now 8 cm from the carina.   Electronically Signed   By: Jeb Levering M.D.   On: 04/19/2014 01:28   Dg Chest Port 1 View  04/18/2014   CLINICAL DATA:  Left PICC line placement.  Intubated.  EXAM: PORTABLE CHEST - 1 VIEW  COMPARISON:  04/18/2014  FINDINGS: Left PICC line tip is obscured over the spine but appears to extend into the proximal SVC level. Exam is rotated to the left. Endotracheal to 10.2 cm above the carina. NG tube in the stomach. Stable cardiomegaly with vascular congestion and basilar atelectasis. Low lung volumes persist. No enlarging effusion or pneumothorax.  IMPRESSION: Left PICC line tip proximal SVC level.  Stable chest exam.    Electronically Signed   By: Jerilynn Mages.  Shick M.D.   On: 04/18/2014 16:47   Dg Chest Port 1 View  04/18/2014   CLINICAL DATA:  59 year old male with PICC line placement. Subsequent encounter.  EXAM: PORTABLE CHEST - 1 VIEW  COMPARISON:  04/17/2014.  FINDINGS: Left-sided PICC line is in place. The tip is difficult to adequately assess but appears to be at the level of formation of the superior vena cava.  Endotracheal tube tip is 6.5 cm above the carina.  Nasogastric tube courses below the diaphragm. Tip is not included on the present exam.  Cardiomegaly.  Pulmonary vascular prominence most notable centrally.  Slightly limited evaluation of the lung bases secondary to degree of inspiration.  Tortuous aorta.  Bilateral acromioclavicular joint degenerative changes.  IMPRESSION: Left-sided PICC line is in place. The tip is difficult to adequately assess but appears to be at the level of formation of the superior vena cava.  Cardiomegaly.  Pulmonary vascular prominence most notable centrally.   Electronically Signed   By: Genia Del M.D.   On: 04/18/2014 10:22    Medications:  Prior to Admission:  Prescriptions prior to admission  Medication Sig Dispense Refill Last Dose  . insulin detemir (LEVEMIR) 100 UNIT/ML injection Inject 60 Units into the skin daily.     04/12/2014 at Unknown time  . metFORMIN (GLUCOPHAGE) 500 MG tablet Take 500 mg by mouth daily.     04/12/2014 at Unknown time  . PRESCRIPTION MEDICATION Blood pressure med awaiting va clairfication      Scheduled: . antiseptic oral rinse  7 mL Mouth Rinse QID  . aspirin  81 mg Per Tube Daily  . chlorhexidine  15 mL Mouth Rinse BID  . feeding supplement (PRO-STAT SUGAR FREE 64)  30 mL Per Tube BID  . folic acid  1 mg Per Tube Daily  . insulin aspart  0-15 Units Subcutaneous 6 times per day  . insulin detemir  20 Units Subcutaneous Daily  . lidocaine (cardiac) 100 mg/51ml      . methylPREDNISolone (SOLU-MEDROL) injection  40 mg Intravenous Q12H  .  multivitamin with minerals  1 tablet Per Tube Daily  . piperacillin-tazobactam (ZOSYN)  IV  3.375 g Intravenous Q8H  . rocuronium      . sodium chloride  10-40 mL Intracatheter Q12H  . sodium chloride  10-40 mL Intracatheter Q12H  . sodium chloride  3 mL Intravenous Q12H  . thiamine  100 mg Oral Daily   Or  . thiamine  100 mg Intravenous Daily  . vancomycin  2,000 mg Intravenous Q48H   Continuous: . dexmedetomidine 1.1 mcg/kg/hr (04/19/14 0540)  . DOBUTamine 2.5 mcg/kg/min (04/19/14 0520)  . feeding supplement (VITAL AF 1.2 CAL) 1,000 mL (04/18/14 1338)  . furosemide (LASIX) infusion 8 mg/hr (04/18/14 2000)  . heparin 850 Units/hr (04/19/14 0450)   DSK:[id number], diphenhydrAMINE, fentaNYL, fentaNYL, naphazoline, ondansetron **OR** ondansetron (ZOFRAN) IV, sodium chloride, sodium chloride  Assesment: He was admitted with angioedema. He had alcohol withdrawal. He has combined systolic and diastolic heart failure. He extubated himself last night and is now back on the ventilator. He still had significant swelling of his tongue. His heart rate is still about 130. He is receiving tube feedings. He is still agitated when the Precedex is reduced Principal Problem:   Acute respiratory failure with hypoxemia Active Problems:   Diabetes type 2, controlled   Angioedema   Acute combined systolic and diastolic CHF, NYHA class 4   Delirium tremens   DVT (deep venous thrombosis)   Ventilator dependence   Severe sepsis    Plan: Continue current treatments. He is not ready for extubation    LOS: 6 days   Sequoyah Counterman L 04/19/2014, 7:23  AM   

## 2014-04-19 NOTE — Progress Notes (Signed)
Patient was titrated with sedation medication prior to self extubation and pain and swelling as needed medications as well.  Without much success and patient titrated to the maximum dose of sedation medication Dr. Kathie RhodesS. Newton called for new orders as the patient still restless even with restraints initiated for safety.

## 2014-04-19 NOTE — Progress Notes (Signed)
Consulting cardiologist: Prentice Docker MD Primary Cardiologist: Prentice Docker MD  Cardiology Specific Problem List: 1. Mixed Systolic/Diastolic CHF 2. Bilateral DVT's 3. ACE induced angioedema 4. Renal Failure 5. Atrial fib/flutter   Subjective:    Intubated, Sedated.   Objective:   Temp:  [97.4 F (36.3 C)-98.5 F (36.9 C)] 98.5 F (36.9 C) (03/24 0400) Pulse Rate:  [39-140] 137 (03/24 0600) Resp:  [12-27] 13 (03/24 0600) BP: (81-154)/(35-114) 142/94 mmHg (03/24 0600) SpO2:  [86 %-100 %] 100 % (03/24 0600) FiO2 (%):  [40 %] 40 % (03/24 0733) Weight:  [287 lb 4.2 oz (130.3 kg)] 287 lb 4.2 oz (130.3 kg) (03/24 0500) Last BM Date: 04/14/14  Filed Weights   04/17/14 0500 04/18/14 0400 04/19/14 0500  Weight: 304 lb 10.8 oz (138.2 kg) 309 lb 1.4 oz (140.2 kg) 287 lb 4.2 oz (130.3 kg)    Intake/Output Summary (Last 24 hours) at 04/19/14 0756 Last data filed at 04/19/14 0500  Gross per 24 hour  Intake 2219.8 ml  Output   5800 ml  Net -3580.2 ml    Telemetry: Atrial fib with RVR  Exam:  General: Sedated/Intubated  HEENT: Conjunctiva and lids normal, oropharynx clear.  Lungs: Diminished in the bases, ventliator  Cardiac: Positive for JVP  RRR, tachycardic,   Abdomen: Normoactive bowel sounds, nontender, nondistended.  Extremities: 3+ pitting edema, distal pulses full.  Neuropsychiatric: Sedated and intubated.  Lab Results:  Basic Metabolic Panel:  Recent Labs Lab 04/15/14 0434 04/16/14 0900 04/17/14 0426 04/18/14 0450  NA 133* 133* 134* 133*  K 4.0 5.0 4.1 4.1  CL 99 99 99 96  CO2 GLUCOSE 79 154* 109* 271*  BUN 47* 65* 77* 84*  CREATININE 3.07* 4.26* 4.25* 3.46*  CALCIUM 9.6 8.9 9.0 8.7  MG 1.8 1.7 2.1  --     Liver Function Tests:  Recent Labs Lab 04/14/14 0427 04/15/14 0434 04/16/14 0900  AST 52* 43* 97*  ALT 30 28 54*  ALKPHOS 95 91 81  BILITOT 1.8* 1.4* 1.8*  PROT 6.9 7.2 6.4  ALBUMIN 3.4* 3.7 3.1*     CBC:  Recent Labs Lab 04/17/14 0426 04/18/14 0032 04/18/14 0450  WBC 9.1 7.7 8.6  HGB 14.2 13.0 12.7*  HCT 40.8 37.5* 36.5*  MCV 92.1 91.9 92.2  PLT 121* 125* 139*    Cardiac Enzymes:  Recent Labs Lab 04/13/14 1545 04/13/14 2224 04/14/14 0427  TROPONINI 0.08* 0.05* 0.04*   Coagulation:  Recent Labs Lab 04/16/14 1006 04/17/14 0426  INR 1.59* 1.74*    Radiology: Dg Chest 1 View  04/19/2014   CLINICAL DATA:  Endotracheal and NG tube placement. Patient pulled endotracheal tube.  EXAM: CHEST  1 VIEW  COMPARISON:  Chest radiograph earlier this day at 1619 hour  FINDINGS: The endotracheal tube is high, 12.9 cm from the carina above the thoracic inlet. Advancement of at least 5 cm recommended. Enteric tube is in place, tip and side port below the diaphragm. Lung volumes remain low. Left upper extremity PICC remains in place in the proximal SVC. Cardiomegaly, vascular congestion, and bibasilar atelectasis are unchanged from prior. No large pleural effusion or pneumothorax.  IMPRESSION: Endotracheal tube is high in position, 12.9 cm from the carina. Advancement of at least 5 cm recommended.   Electronically Signed   By: Rubye Oaks M.D.   On: 04/19/2014 00:13   Dg Chest Port 1 View  04/19/2014   CLINICAL DATA:  Difficult intubation, endotracheal tube placement  EXAM: PORTABLE CHEST - 1 VIEW  COMPARISON:  04/18/2014 at 2330 hour  FINDINGS: The endotracheal tube has been advanced and is now 8 cm from the carina. Enteric tube and left upper extremity PICC remain in place. Cardiomegaly, mild vascular congestion, and bibasilar atelectasis are unchanged.  IMPRESSION: Endotracheal tube has been advanced and is now 8 cm from the carina.   Electronically Signed   By: Rubye Oaks M.D.   On: 04/19/2014 01:28   Dg Chest Port 1 View  04/18/2014   CLINICAL DATA:  Left PICC line placement.  Intubated.  EXAM: PORTABLE CHEST - 1 VIEW  COMPARISON:  04/18/2014  FINDINGS: Left PICC line tip  is obscured over the spine but appears to extend into the proximal SVC level. Exam is rotated to the left. Endotracheal to 10.2 cm above the carina. NG tube in the stomach. Stable cardiomegaly with vascular congestion and basilar atelectasis. Low lung volumes persist. No enlarging effusion or pneumothorax.  IMPRESSION: Left PICC line tip proximal SVC level.  Stable chest exam.   Electronically Signed   By: Judie Petit.  Shick M.D.   On: 04/18/2014 16:47   Dg Chest Port 1 View  04/18/2014   CLINICAL DATA:  58 year old male with PICC line placement. Subsequent encounter.  EXAM: PORTABLE CHEST - 1 VIEW  COMPARISON:  04/17/2014.  FINDINGS: Left-sided PICC line is in place. The tip is difficult to adequately assess but appears to be at the level of formation of the superior vena cava.  Endotracheal tube tip is 6.5 cm above the carina.  Nasogastric tube courses below the diaphragm. Tip is not included on the present exam.  Cardiomegaly.  Pulmonary vascular prominence most notable centrally.  Slightly limited evaluation of the lung bases secondary to degree of inspiration.  Tortuous aorta.  Bilateral acromioclavicular joint degenerative changes.  IMPRESSION: Left-sided PICC line is in place. The tip is difficult to adequately assess but appears to be at the level of formation of the superior vena cava.  Cardiomegaly.  Pulmonary vascular prominence most notable centrally.   Electronically Signed   By: Lacy Duverney M.D.   On: 04/18/2014 10:22      Medications:   Scheduled Medications: . antiseptic oral rinse  7 mL Mouth Rinse QID  . aspirin  81 mg Per Tube Daily  . chlorhexidine  15 mL Mouth Rinse BID  . feeding supplement (PRO-STAT SUGAR FREE 64)  30 mL Per Tube BID  . folic acid  1 mg Per Tube Daily  . insulin aspart  0-15 Units Subcutaneous 6 times per day  . insulin detemir  20 Units Subcutaneous Daily  . lidocaine (cardiac) 100 mg/68ml      . methylPREDNISolone (SOLU-MEDROL) injection  40 mg Intravenous Q12H  .  multivitamin with minerals  1 tablet Per Tube Daily  . piperacillin-tazobactam (ZOSYN)  IV  3.375 g Intravenous Q8H  . rocuronium      . sodium chloride  10-40 mL Intracatheter Q12H  . sodium chloride  10-40 mL Intracatheter Q12H  . sodium chloride  3 mL Intravenous Q12H  . thiamine  100 mg Oral Daily   Or  . thiamine  100 mg Intravenous Daily  . vancomycin  2,000 mg Intravenous Q48H    Infusions: . dexmedetomidine 1.1 mcg/kg/hr (04/19/14 0540)  . DOBUTamine 2.5 mcg/kg/min (04/19/14 0520)  . feeding supplement (VITAL AF 1.2 CAL) 1,000 mL (04/18/14 1338)  . furosemide (LASIX) infusion 8 mg/hr (04/18/14 2000)  . heparin 850 Units/hr (04/19/14 0450)  PRN Medications: albuterol, diphenhydrAMINE, fentaNYL, fentaNYL, naphazoline, ondansetron **OR** ondansetron (ZOFRAN) IV, sodium chloride, sodium chloride   Assessment and Plan:   1.Acute on Chronic Mixed CHF: In the setting of Systolic Dysfunction 20%-25%. He is on intropic support with dobutamine and IV lasix gtt with urine output of 7,639 since admission, Continues edematous. Creatinine is 3.46. Continue current regimen. May need to cut back on dobutamine gtt as his HR is very elevated this am. Will discuss with Dr. Purvis SheffieldKoneswaran.   2. Atrial fib with RVR: Continues on dobutamine and heparin gtt. Will discuss with Dr. Purvis SheffieldKoneswaran concerning changes.  3. DVT: Bilateral, continue heparin gtt.   4. Renal Failure: Followed by Dr. March RummageBefakadu  Kathryn M. Lawrence NP [facility]  04/19/2014, 7:56 AM   The patient was seen and examined, and I agree with the assessment and plan as documented above, with modifications as noted below. Pt with late hypersensitivity reaction to ACEI, treated with steroids and removal of offending agent. Also with DT's. Has acute renal failure, unclear etiology at this time. Question whether he has a vasculitic process. Eventually he will need a renal ultrasound and perhaps renal biopsy. Outside records would be helpful  to establish his baseline. Due to RV dysfunction and hypotension and inability to obtain a VQ scan, I ordered LE Dopplers on 3/21 which confirmed presence of DVT. He was subsequently started on IV heparin.  Started dobutamine due to biventricular heart failure (possible alcoholic cardiomyopathy).  Started Lasix infusion at 8 mg/hr on 3/21. He is on empiric vancomycin and Zosyn.  Pt has had over 8 L urine output on current management. Would like to wean off of dobutamine, but BP 90/70's this morning. Will reduce Lasix drip to 4 mg/hr. Will then try to wean off dobutamine once SBP remains consistently > 120 mmHg. BMET pending this morning. Continue IV heparin for b/l DVT and presumed pulmonary embolism.

## 2014-04-20 LAB — GLUCOSE, CAPILLARY
GLUCOSE-CAPILLARY: 160 mg/dL — AB (ref 70–99)
GLUCOSE-CAPILLARY: 239 mg/dL — AB (ref 70–99)
GLUCOSE-CAPILLARY: 288 mg/dL — AB (ref 70–99)
Glucose-Capillary: 139 mg/dL — ABNORMAL HIGH (ref 70–99)
Glucose-Capillary: 267 mg/dL — ABNORMAL HIGH (ref 70–99)
Glucose-Capillary: 279 mg/dL — ABNORMAL HIGH (ref 70–99)

## 2014-04-20 LAB — BASIC METABOLIC PANEL
Anion gap: 11 (ref 5–15)
BUN: 85 mg/dL — ABNORMAL HIGH (ref 6–23)
CALCIUM: 8.9 mg/dL (ref 8.4–10.5)
CO2: 33 mmol/L — AB (ref 19–32)
Chloride: 104 mmol/L (ref 96–112)
Creatinine, Ser: 2.17 mg/dL — ABNORMAL HIGH (ref 0.50–1.35)
GFR calc Af Amer: 36 mL/min — ABNORMAL LOW (ref >90)
GFR, EST NON AFRICAN AMERICAN: 31 mL/min — AB (ref >90)
Glucose, Bld: 232 mg/dL — ABNORMAL HIGH (ref 70–99)
Potassium: 3.2 mmol/L — ABNORMAL LOW (ref 3.5–5.1)
Sodium: 148 mmol/L — ABNORMAL HIGH (ref 135–145)

## 2014-04-20 LAB — BLOOD GAS, ARTERIAL
Acid-Base Excess: 4.9 mmol/L — ABNORMAL HIGH (ref 0.0–2.0)
BICARBONATE: 28.5 meq/L — AB (ref 20.0–24.0)
Drawn by: 21310
FIO2: 40 %
MECHVT: 700 mL
O2 SAT: 98.2 %
PCO2 ART: 38.6 mmHg (ref 35.0–45.0)
PEEP: 5 cmH2O
PH ART: 7.48 — AB (ref 7.350–7.450)
PO2 ART: 115 mmHg — AB (ref 80.0–100.0)
Patient temperature: 37
RATE: 12 resp/min
TCO2: 24.2 mmol/L (ref 0–100)

## 2014-04-20 LAB — CBC
HEMATOCRIT: 35.5 % — AB (ref 39.0–52.0)
Hemoglobin: 11.5 g/dL — ABNORMAL LOW (ref 13.0–17.0)
MCH: 30.7 pg (ref 26.0–34.0)
MCHC: 32.4 g/dL (ref 30.0–36.0)
MCV: 94.7 fL (ref 78.0–100.0)
PLATELETS: 143 10*3/uL — AB (ref 150–400)
RBC: 3.75 MIL/uL — ABNORMAL LOW (ref 4.22–5.81)
RDW: 13.4 % (ref 11.5–15.5)
WBC: 11.3 10*3/uL — AB (ref 4.0–10.5)

## 2014-04-20 LAB — ABO/RH: ABO/RH(D): O NEG

## 2014-04-20 LAB — HEPARIN LEVEL (UNFRACTIONATED): HEPARIN UNFRACTIONATED: 0.58 [IU]/mL (ref 0.30–0.70)

## 2014-04-20 MED ORDER — DEXMEDETOMIDINE HCL IN NACL 200 MCG/50ML IV SOLN
INTRAVENOUS | Status: AC
  Filled 2014-04-20: qty 200

## 2014-04-20 MED ORDER — DEXTROSE 5 % IV SOLN
2.0000 g | INTRAVENOUS | Status: DC
  Administered 2014-04-20 – 2014-04-29 (×10): 2 g via INTRAVENOUS
  Filled 2014-04-20 (×11): qty 2

## 2014-04-20 MED ORDER — SODIUM CHLORIDE 0.9 % IJ SOLN
INTRAMUSCULAR | Status: AC
  Filled 2014-04-20: qty 30

## 2014-04-20 MED ORDER — FUROSEMIDE 10 MG/ML IJ SOLN
80.0000 mg | Freq: Once | INTRAMUSCULAR | Status: AC
  Administered 2014-04-20: 80 mg via INTRAVENOUS
  Filled 2014-04-20: qty 8

## 2014-04-20 MED ORDER — SODIUM CHLORIDE 0.9 % IV SOLN
Freq: Once | INTRAVENOUS | Status: AC
  Administered 2014-04-20: 250 mL via INTRAVENOUS

## 2014-04-20 MED ORDER — POTASSIUM CL IN DEXTROSE 5% 20 MEQ/L IV SOLN
20.0000 meq | INTRAVENOUS | Status: DC
  Administered 2014-04-20 – 2014-04-23 (×4): 20 meq via INTRAVENOUS

## 2014-04-20 MED ORDER — CEFTRIAXONE SODIUM IN DEXTROSE 40 MG/ML IV SOLN
2.0000 g | INTRAVENOUS | Status: DC
  Filled 2014-04-20 (×3): qty 50

## 2014-04-20 NOTE — Progress Notes (Signed)
Subjective: Interval History: Patient remains intubated but awake. At this moment doesn't communicate .  Objective: Vital signs in last 24 hours: Temp:  [97.2 F (36.2 C)-99.4 F (37.4 C)] 98.5 F (36.9 C) (03/25 0736) Pulse Rate:  [48-143] 97 (03/25 0600) Resp:  [11-19] 14 (03/25 0600) BP: (82-155)/(61-105) 124/79 mmHg (03/25 0600) SpO2:  [100 %] 100 % (03/25 0600) FiO2 (%):  [40 %] 40 % (03/25 0305) Weight:  [127.3 kg (280 lb 10.3 oz)] 127.3 kg (280 lb 10.3 oz) (03/25 0500) Weight change: -3 kg (-6 lb 9.8 oz)  Intake/Output from previous day: 03/24 0701 - 03/25 0700 In: 2828.9 [I.V.:1382.2; NG/GT:1396.7; IV Piggyback:50] Out: 5800 [Urine:5800] Intake/Output this shift:   generally patient remains intubated. Awake. Chest: Decreased breath sounds bilaterally. Heart exam revealed regular rate and rhythm Abdomen: Distended Extremities: He has 1+ edema on the right and trace on the left    Lab Results:  Recent Labs  04/18/14 0450 04/20/14 0428  WBC 8.6 11.3*  HGB 12.7* 11.5*  HCT 36.5* 35.5*  PLT 139* 143*   BMET:   Recent Labs  04/18/14 0450 04/20/14 0428  NA 133* 148*  K 4.1 3.2*  CL 96 104  CO2 23 33*  GLUCOSE 271* 232*  BUN 84* 85*  CREATININE 3.46* 2.17*  CALCIUM 8.7 8.9   No results for input(Reynolds): PTH in the last 72 hours. Iron Studies: No results for input(Reynolds): IRON, TIBC, TRANSFERRIN, FERRITIN in the last 72 hours.  Studies/Results: Dg Chest 1 View  04/19/2014   CLINICAL DATA:  Endotracheal and NG tube placement. Patient pulled endotracheal tube.  EXAM: CHEST  1 VIEW  COMPARISON:  Chest radiograph earlier this day at 1619 hour  FINDINGS: The endotracheal tube is high, 12.9 cm from the carina above the thoracic inlet. Advancement of at least 5 cm recommended. Enteric tube is in place, tip and side port below the diaphragm. Lung volumes remain low. Left upper extremity PICC remains in place in the proximal SVC. Cardiomegaly, vascular congestion, and  bibasilar atelectasis are unchanged from prior. No large pleural effusion or pneumothorax.  IMPRESSION: Endotracheal tube is high in position, 12.9 cm from the carina. Advancement of at least 5 cm recommended.   Electronically Signed   By: Gabriel Reynolds GabrielD.   On: 04/19/2014 00:13   Dg Chest Port 1 View  04/19/2014   CLINICAL DATA:  Difficult intubation, endotracheal tube placement  EXAM: PORTABLE CHEST - 1 VIEW  COMPARISON:  04/18/2014 at 2330 hour  FINDINGS: The endotracheal tube has been advanced and is now 8 cm from the carina. Enteric tube and left upper extremity PICC remain in place. Cardiomegaly, mild vascular congestion, and bibasilar atelectasis are unchanged.  IMPRESSION: Endotracheal tube has been advanced and is now 8 cm from the carina.   Electronically Signed   By: Gabriel Reynolds GabrielD.   On: 04/19/2014 01:28   Dg Chest Port 1 View  04/18/2014   CLINICAL DATA:  Left PICC line placement.  Intubated.  EXAM: PORTABLE CHEST - 1 VIEW  COMPARISON:  04/18/2014  FINDINGS: Left PICC line tip is obscured over the spine but appears to extend into the proximal SVC level. Exam is rotated to the left. Endotracheal to 10.2 cm above the carina. NG tube in the stomach. Stable cardiomegaly with vascular congestion and basilar atelectasis. Low lung volumes persist. No enlarging effusion or pneumothorax.  IMPRESSION: Left PICC line tip proximal SVC level.  Stable chest exam.   Electronically Signed   By:  M.  Reynolds GabrielD.   On: 04/18/2014 16:47   Dg Chest Port 1 View  04/18/2014   CLINICAL DATA:  59 year old male with PICC line placement. Subsequent encounter.  EXAM: PORTABLE CHEST - 1 VIEW  COMPARISON:  04/17/2014.  FINDINGS: Left-sided PICC line is in place. The tip is difficult to adequately assess but appears to be at the level of formation of the superior vena cava.  Endotracheal tube tip is 6.5 cm above the carina.  Nasogastric tube courses below the diaphragm. Tip is not included on the present exam.   Cardiomegaly.  Pulmonary vascular prominence most notable centrally.  Slightly limited evaluation of the lung bases secondary to degree of inspiration.  Tortuous aorta.  Bilateral acromioclavicular joint degenerative changes.  IMPRESSION: Left-sided PICC line is in place. The tip is difficult to adequately assess but appears to be at the level of formation of the superior vena cava.  Cardiomegaly.  Pulmonary vascular prominence most notable centrally.   Electronically Signed   By: Gabriel DuverneySteven  Olson GabrielD.   On: 04/18/2014 10:22    I have reviewed the patient'Reynolds current medications.  Assessment/Plan: Problem #1 acute kidney injury: Patient BUN and creatinine is declining. Hence his renal function at this moment is progressively improving. Problem #2 anasarca: Patient on dobutamine and also Lasix drip. He had 5800 mL of urine output . His anasarca has improved significantly. Problem #3 liver cirrhosis Problem #4 severe combined systolic and diastolic dysfunction Problem #5 respiratory failure: Presently patient remained intubated Problem #6  sepsis: Patient on antibiotics. Patient is afebrile however his white blood cell seems to be increasing. Problem #7 angioedema Problem #8 hypokalemia: This is secondary to diuretics. Problem #9 hypernatremia: Possibly from lack of free water. Plan: We'll DC Lasix drip We'll start patient on D5 water with 20 mEq of KCl at 75 mL per hour We'll check his basic metabolic panel in the morning.   LOS: 7 days   Gabriel Reynolds 04/20/2014,7:43 AM

## 2014-04-20 NOTE — Progress Notes (Signed)
Consulting cardiologist: Prentice Docker MD Primary Cardiologist: Prentice Docker MD  Cardiology Specific Problem List: 1. Mixed Systolic/Diastolic CHF 2. Bilateral DVT 3. ACE induced angioedema 4. Atrial flutter 5. Renal faiure  Subjective:   Intubated, sedated but eyes are open.   Objective:   Temp:  [97.2 F (36.2 C)-99.4 F (37.4 C)] 98.5 F (36.9 C) (03/25 0736) Pulse Rate:  [48-143] 97 (03/25 0600) Resp:  [11-19] 14 (03/25 0600) BP: (82-155)/(61-105) 124/79 mmHg (03/25 0600) SpO2:  [100 %] 100 % (03/25 0600) FiO2 (%):  [40 %] 40 % (03/25 0305) Weight:  [280 lb 10.3 oz (127.3 kg)] 280 lb 10.3 oz (127.3 kg) (03/25 0500) Last BM Date: 04/14/14  Filed Weights   04/18/14 0400 04/19/14 0500 04/20/14 0500  Weight: 309 lb 1.4 oz (140.2 kg) 287 lb 4.2 oz (130.3 kg) 280 lb 10.3 oz (127.3 kg)    Intake/Output Summary (Last 24 hours) at 04/20/14 0817 Last data filed at 04/20/14 0600  Gross per 24 hour  Intake 2808.9 ml  Output   4450 ml  Net -1641.1 ml    Telemetry: Sinus tachycardia rates in the 90's -100  Exam:  General: No acute distress.  HEENT: Conjunctiva and lids normal, oropharynx clear.  Lungs: Coarse breath sounds not wheezes, continues on ventilator  Cardiac: No elevated JVP or bruits. RRR, no gallop or rub.   Abdomen: Normoactive bowel sounds, nontender, mild distended.  Extremities: 2+ pitting edema on the right, distal pulses full.  Neuropsychiatric: Sedated, eyes open to verbal stimuli  Lab Results:  Basic Metabolic Panel:  Recent Labs Lab 04/15/14 0434 04/16/14 0900 04/17/14 0426 04/18/14 0450 04/20/14 0428  NA 133* 133* 134* 133* 148*  K 4.0 5.0 4.1 4.1 3.2*  CL 99 99 99 96 104  CO2 33*  GLUCOSE 79 154* 109* 271* 232*  BUN 47* 65* 77* 84* 85*  CREATININE 3.07* 4.26* 4.25* 3.46* 2.17*  CALCIUM 9.6 8.9 9.0 8.7 8.9  MG 1.8 1.7 2.1  --   --     Liver Function Tests:  Recent Labs Lab 04/14/14 0427  04/15/14 0434 04/16/14 0900  AST 52* 43* 97*  ALT 30 28 54*  ALKPHOS 95 91 81  BILITOT 1.8* 1.4* 1.8*  PROT 6.9 7.2 6.4  ALBUMIN 3.4* 3.7 3.1*    CBC:  Recent Labs Lab 04/18/14 0032 04/18/14 0450 04/20/14 0428  WBC 7.7 8.6 11.3*  HGB 13.0 12.7* 11.5*  HCT 37.5* 36.5* 35.5*  MCV 91.9 92.2 94.7  PLT 125* 139* 143*    Cardiac Enzymes:  Recent Labs Lab 04/13/14 1545 04/13/14 2224 04/14/14 0427  TROPONINI 0.08* 0.05* 0.04*   Coagulation:  Recent Labs Lab 04/16/14 1006 04/17/14 0426  INR 1.59* 1.74*    Radiology: Dg Chest 1 View  04/19/2014   CLINICAL DATA:  Endotracheal and NG tube placement. Patient pulled endotracheal tube.  EXAM: CHEST  1 VIEW  COMPARISON:  Chest radiograph earlier this day at 1619 hour  FINDINGS: The endotracheal tube is high, 12.9 cm from the carina above the thoracic inlet. Advancement of at least 5 cm recommended. Enteric tube is in place, tip and side port below the diaphragm. Lung volumes remain low. Left upper extremity PICC remains in place in the proximal SVC. Cardiomegaly, vascular congestion, and bibasilar atelectasis are unchanged from prior. No large pleural effusion or pneumothorax.  IMPRESSION: Endotracheal tube is high in position, 12.9 cm from the carina. Advancement of at least 5 cm recommended.  Electronically Signed   By: Rubye OaksMelanie  Ehinger M.D.   On: 04/19/2014 00:13   Dg Chest Port 1 View  04/19/2014   CLINICAL DATA:  Difficult intubation, endotracheal tube placement  EXAM: PORTABLE CHEST - 1 VIEW  COMPARISON:  04/18/2014 at 2330 hour  FINDINGS: The endotracheal tube has been advanced and is now 8 cm from the carina. Enteric tube and left upper extremity PICC remain in place. Cardiomegaly, mild vascular congestion, and bibasilar atelectasis are unchanged.  IMPRESSION: Endotracheal tube has been advanced and is now 8 cm from the carina.   Electronically Signed   By: Rubye OaksMelanie  Ehinger M.D.   On: 04/19/2014 01:28    Medications:     Scheduled Medications: . antiseptic oral rinse  7 mL Mouth Rinse QID  . aspirin  81 mg Per Tube Daily  . cefTRIAXone (ROCEPHIN)  IV  1 g Intravenous Q24H  . chlorhexidine  15 mL Mouth Rinse BID  . famotidine (PEPCID) IV  20 mg Intravenous Q24H  . feeding supplement (PRO-STAT SUGAR FREE 64)  30 mL Per Tube BID  . folic acid  1 mg Per Tube Daily  . insulin aspart  0-15 Units Subcutaneous 6 times per day  . insulin detemir  20 Units Subcutaneous Daily  . methylPREDNISolone (SOLU-MEDROL) injection  40 mg Intravenous Q12H  . multivitamin with minerals  1 tablet Per Tube Daily  . sodium chloride  10-40 mL Intracatheter Q12H  . sodium chloride  10-40 mL Intracatheter Q12H  . sodium chloride  3 mL Intravenous Q12H  . sodium chloride      . thiamine  100 mg Oral Daily   Or  . thiamine  100 mg Intravenous Daily    Infusions: . dexmedetomidine 1.1 mcg/kg/hr (04/20/14 0755)  . dextrose 5 % with KCl 20 mEq / L    . DOBUTamine Stopped (04/19/14 1433)  . feeding supplement (VITAL AF 1.2 CAL) 1,000 mL (04/20/14 0600)  . heparin 900 Units/hr (04/20/14 0600)    PRN Medications: albuterol, diphenhydrAMINE, fentaNYL, fentaNYL, naphazoline, ondansetron **OR** ondansetron (ZOFRAN) IV, sodium chloride, sodium chloride   Assessment and Plan:   1. Mixed Systolic and Diastolic CHF: Dobutamine now discontinued. He has diuresed 10.6 liters since admission with 2.9 liters overnight. Creatinine is improved to 2.17 from 4.25 on admission. Remains on lasix gtt. Slow but steady improvement in CHF. Continue current lasix gtt over weekend until able to take po. BP stable will begin coreg once he is euvolemic.   2. Atrial flutter: Appears to be back in NSR currently. Heart rate is better off of dobutamine gtt. Consider BB when euvolemic with systolic dysfunction.  3. VDRF: Management per Dr. Juanetta GoslingHawkins.   4. Bilateral DVT's: Continues on heparin gtt.   5. AKI: Creatinine is improving. Vasculitis is still in  question with possible renal biopsy once he is stable  6. ACE induced angioedema:  ACE has been discontinued, he has been treated with steroids.    Bettey MareKathryn M. Lawrence NP [facility]  04/20/2014, 8:17 AM   The patient was seen and examined, and I agree with the assessment and plan as documented above, with modifications as noted below. Pt with late hypersensitivity reaction to ACEI, treated with steroids and removal of offending agent. Also had DT's earlier in the week. Has acute renal failure, unclear etiology at this time. Question whether he has a vasculitic process. Eventually he will need a renal ultrasound and perhaps renal biopsy. Outside records would be helpful to establish his baseline. However,  creatinine now down to 2.17 from a peak of 4.26. Due to RV dysfunction and hypotension and inability to obtain a VQ scan, I ordered LE Dopplers on 3/21 which confirmed presence of DVT. He was subsequently started on IV heparin.  Had been on dobutamine due to biventricular heart failure (possible alcoholic cardiomyopathy).  Had also been on Lasix infusion which I started on 3/21. He is on empiric ceftriaxone (had been on vancomycin and Zosyn).  Pt has had over 6.5 L urine output in past 48 hrs and Lasix drip has been stopped. I weaned off dobutamine on 3/24.  FFP has been ordered by pulmonary to help reduce angioedema. I recommend giving 40-80 mg IV Lasix afterwards to avoid transfusion-associated circulatory overload. He will need daily diuretics given his severe biventricular heart failure. Continue IV heparin for b/l DVT and presumed pulmonary embolism. Will eventually need Coreg and oral anticoagulation.

## 2014-04-20 NOTE — Progress Notes (Signed)
[facility] PROGRESS NOTE  Gabriel Reynolds Gabriel Reynolds DOB: [birth year] Gabriel Reynolds PCP: Pcp Not In System  Assessment/Plan: Ventilatory dependent hypoxemic respiratory failure -Etiology likely a combination of decompensated CHF, angioedema and possibly DTs.  -Self extubated 3/24 requiring re-intubation. -Remains intubated today, but appears ready for weaning. -Dr. Juanetta GoslingHawkins has requested FFP for angioedema to see if this will allow extubation.  Acute combined systolic CHF/Biventricular Failure -Was able to wean off ldobutamine drip 3/24. -Renal has DC'd lasix drip due to hypernatremia and has started D5. -Appreciate cardiology following. -Is 10.5 L negative. -Consider starting lasix in am per cardiology recommendations.  Sepsis with bacteremia -2 out of 2 cultures with strep viridans. -Will narrow antibiotics to rocephin 3/24. -Possible source appears to be pneumonia giving left base consolidation on chest x-ray.  Hospital-acquired pneumonia/aspiration pneumonia -See above for details.  Angioedema -Increased tongue swelling noted overnight. -Continue steroids/Benadryl. -Presumed a late hypersensitivity reaction to ACE-I.  Alcohol abuse -DTs may have played a role in his acute respiratory decompensation over the weekend. -Continue thiamine/folate.  Bilateral DVTs  -Continue heparin drip. -Unable to confirm PE at this point given patient's unstable illness and renal dysfunction. -Transition to oral anticoagulants once extubated.  Acute renal failure -Improving on dobutamine drip and Lasix, good urine output. -Have no records to determine chronicity of renal failure. -Cr down to 2.17 3/25.  Hypernatremia -Na 148. -renal has stopped diuretics and started on a D5 drip.  Code Status: Full code Family Communication: Wife [name] at bedside updated on plan of care  Disposition Plan: Keep in  ICU   Consultants:  Cardiology  Pulmonary   Antibiotics:  Rocephin  Subjective: Awake, calm, makes eye contact. Remains on ventilator  Objective: Filed Vitals:   04/20/14 1145 04/20/14 1200 04/20/14 1209 04/20/14 1215  BP: 137/85 127/83    Pulse: 96 96  96  Temp: 100.5 F (38.1 C)  99 F (37.2 C)   TempSrc: Axillary  Axillary   Resp: 12 12  13   Height:      Weight:      SpO2: 100% 100%  100%    Intake/Output Summary (Last 24 hours) at 04/20/14 1230 Last data filed at 04/20/14 0940  Gross per 24 hour  Intake 2812.9 ml  Output   3450 ml  Net -637.1 ml   Filed Weights   04/18/14 0400 04/19/14 0500 04/20/14 0500  Weight: 140.2 kg (309 lb 1.4 oz) 130.3 kg (287 lb 4.2 oz) 127.3 kg (280 lb 10.3 oz)    Exam:   General:  awake  Cardiovascular: Regular rate and rhythm  Respiratory: Decreased left base sounds, no wheezes  Abdomen: Soft, nontender, nondistended, positive bowel sounds  Extremities: 3+ pitting edema bilaterally   Neurologic:  Moves all 4 spontaneously  Data Reviewed: Basic Metabolic Panel:  Recent Labs Lab 04/15/14 0434 04/16/14 0900 04/17/14 0426 04/18/14 0450 04/20/14 0428  NA 133* 133* 134* 133* 148*  K 4.0 5.0 4.1 4.1 3.2*  CL 99 99 99 96 104  CO2 22 22 20 23  33*  GLUCOSE 79 154* 109* 271* 232*  BUN 47* 65* 77* 84* 85*  CREATININE 3.07* 4.26* 4.25* 3.46* 2.17*  CALCIUM 9.6 8.9 9.0 8.7 8.9  MG 1.8 1.7 2.1  --   --    Liver Function Tests:  Recent Labs Lab 04/13/14 1545 04/14/14 0427 04/15/14 0434 04/16/14 0900  AST 61* 52* 43* 97*  ALT 30 30 28  54*  ALKPHOS 97 95 91 81  BILITOT 2.1*  1.8* 1.4* 1.8*  PROT 7.2 6.9 7.2 6.4  ALBUMIN 3.7 3.4* 3.7 3.1*    Recent Labs Lab 04/15/14 2000  LIPASE 25   No results for input(s): AMMONIA in the last 168 hours. CBC:  Recent Labs Lab 04/16/14 1006 04/17/14 0426 04/18/14 0032 04/18/14 0450 04/20/14 0428  WBC DUPLICATE 9.1 7.7 8.6 11.3*  NEUTROABS 6.4  --  6.4  --   --    HGB DUPLICATE 14.2 13.0 12.7* 11.5*  HCT DUPLICATE 40.8 37.5* 36.5* 35.5*  MCV DUPLICATE 92.1 91.9 92.2 94.7  PLT DUPLICATE 121* 125* 139* 143*   Cardiac Enzymes:  Recent Labs Lab 04/13/14 1545 04/13/14 2224 04/14/14 0427  TROPONINI 0.08* 0.05* 0.04*   BNP (last 3 results)  Recent Labs  04/13/14 1110  BNP 617.0*    ProBNP (last 3 results) No results for input(s): PROBNP in the last 8760 hours.  CBG:  Recent Labs Lab 04/19/14 1949 04/20/14 0030 04/20/14 0424 04/20/14 0722 04/20/14 1110  GLUCAP 274* 288* 239* 139* 160*    Recent Results (from the past 240 hour(s))  MRSA PCR Screening     Status: Abnormal   Collection Time: 04/13/14  4:00 PM  Result Value Ref Range Status   MRSA by PCR POSITIVE (A) NEGATIVE Final    Comment:        The GeneXpert MRSA Assay (FDA approved for NASAL specimens only), is one component of a comprehensive MRSA colonization surveillance program. It is not intended to diagnose MRSA infection nor to guide or monitor treatment for MRSA infections. RESULT CALLED TO, READ BACK BY AND VERIFIED WITH:  CUMMINGS,R @ 2205 ON 04/13/14 BY WOODIE,J   Culture, blood (x 2)     Status: None   Collection Time: 04/16/14 10:06 AM  Result Value Ref Range Status   Specimen Description BLOOD PORTA CATH DRAWN BY RN  Final   Special Requests BOTTLES DRAWN AEROBIC AND ANAEROBIC 6CC  Final   Culture   Final    VIRIDANS STREPTOCOCCUS Note: Gram Stain Report Called to,Read Back By and Verified With: MCDANIEL M. AT 0700 BY [email or web address] Performed at [facility] Performed at [facility]    Report Status 04/19/2014 FINAL  Final   Organism ID, Bacteria VIRIDANS STREPTOCOCCUS  Final      Susceptibility   Viridans streptococcus - MIC (ETEST)*    PENICILLIN .064 SENSITIVE Sensitive     * VIRIDANS STREPTOCOCCUS  Culture, blood (x 2)     Status: None   Collection Time: 04/16/14 10:16 AM  Result Value Ref Range Status   Specimen Description  BLOOD RIGHT ANTECUBITAL  Final   Special Requests BOTTLES DRAWN AEROBIC AND ANAEROBIC 10CC  Final   Culture   Final    VIRIDANS STREPTOCOCCUS Note: SUSCEPTIBILITIES PERFORMED ON PREVIOUS CULTURE WITHIN THE LAST 5 DAYS. Note: Gram Stain Report Called to,Read Back By and Verified With: MISTY MCDANIEL RN 04/17/14 0700 BY RESSEGGER R Performed at [facility] Performed at [facility]    Report Status 04/19/2014 FINAL  Final     Studies: Dg Chest 1 View  04/19/2014   CLINICAL DATA:  Endotracheal and NG tube placement. Patient pulled endotracheal tube.  EXAM: CHEST  1 VIEW  COMPARISON:  Chest radiograph earlier this day at 1619 hour  FINDINGS: The endotracheal tube is high, 12.9 cm from the carina above the thoracic inlet. Advancement of at least 5 cm recommended. Enteric tube is in place, tip and side port below the diaphragm. Lung  volumes remain low. Left upper extremity PICC remains in place in the proximal SVC. Cardiomegaly, vascular congestion, and bibasilar atelectasis are unchanged from prior. No large pleural effusion or pneumothorax.  IMPRESSION: Endotracheal tube is high in position, 12.9 cm from the carina. Advancement of at least 5 cm recommended.   Electronically Signed   By: Rubye Oaks M.D.   On: 04/19/2014 00:13   Dg Chest Port 1 View  04/19/2014   CLINICAL DATA:  Difficult intubation, endotracheal tube placement  EXAM: PORTABLE CHEST - 1 VIEW  COMPARISON:  04/18/2014 at 2330 hour  FINDINGS: The endotracheal tube has been advanced and is now 8 cm from the carina. Enteric tube and left upper extremity PICC remain in place. Cardiomegaly, mild vascular congestion, and bibasilar atelectasis are unchanged.  IMPRESSION: Endotracheal tube has been advanced and is now 8 cm from the carina.   Electronically Signed   By: Rubye Oaks M.D.   On: 04/19/2014 01:28   Dg Chest Port 1 View  04/18/2014   CLINICAL DATA:  Left PICC line placement.  Intubated.  EXAM: PORTABLE CHEST -  1 VIEW  COMPARISON:  04/18/2014  FINDINGS: Left PICC line tip is obscured over the spine but appears to extend into the proximal SVC level. Exam is rotated to the left. Endotracheal to 10.2 cm above the carina. NG tube in the stomach. Stable cardiomegaly with vascular congestion and basilar atelectasis. Low lung volumes persist. No enlarging effusion or pneumothorax.  IMPRESSION: Left PICC line tip proximal SVC level.  Stable chest exam.   Electronically Signed   By: Judie Petit.  Shick M.D.   On: 04/18/2014 16:47    Scheduled Meds: . antiseptic oral rinse  7 mL Mouth Rinse QID  . aspirin  81 mg Per Tube Daily  . cefTRIAXone (ROCEPHIN)  IV  1 g Intravenous Q24H  . chlorhexidine  15 mL Mouth Rinse BID  . famotidine (PEPCID) IV  20 mg Intravenous Q24H  . feeding supplement (PRO-STAT SUGAR FREE 64)  30 mL Per Tube BID  . folic acid  1 mg Per Tube Daily  . furosemide  80 mg Intravenous Once  . insulin aspart  0-15 Units Subcutaneous 6 times per day  . insulin detemir  20 Units Subcutaneous Daily  . methylPREDNISolone (SOLU-MEDROL) injection  40 mg Intravenous Q12H  . multivitamin with minerals  1 tablet Per Tube Daily  . sodium chloride  10-40 mL Intracatheter Q12H  . sodium chloride  10-40 mL Intracatheter Q12H  . sodium chloride  3 mL Intravenous Q12H  . sodium chloride      . thiamine  100 mg Oral Daily   Or  . thiamine  100 mg Intravenous Daily   Continuous Infusions: . dexmedetomidine 1.2 mcg/kg/hr (04/20/14 1157)  . dextrose 5 % with KCl 20 mEq / L 20 mEq (04/20/14 1200)  . feeding supplement (VITAL AF 1.2 CAL) 1,000 mL (04/20/14 0900)  . heparin 900 Units/hr (04/20/14 0900)    Principal Problem:   Acute respiratory failure with hypoxemia Active Problems:   Diabetes type 2, controlled   Angioedema   Acute combined systolic and diastolic CHF, NYHA class 4   Delirium tremens   DVT (deep venous thrombosis)   Ventilator dependence   Severe sepsis    Time spent: 45 minutes. Greater  than 50% of this time was spent in direct contact with the patient coordinating care.    Chaya Jan  [facility] Pager [phone]  If 7PM-7AM, please contact night-coverage at  [email or web address], password [facility] 04/20/2014, 12:30 PM  LOS: 7 days

## 2014-04-20 NOTE — Progress Notes (Signed)
Subjective: He looks much better. His heart rate is better and he is weaning. His tongue is still swollen.  Objective: Vital signs in last 24 hours: Temp:  [97.2 F (36.2 C)-99.4 F (37.4 C)] 98.5 F (36.9 C) (03/25 0736) Pulse Rate:  [48-143] 96 (03/25 0900) Resp:  [11-28] 27 (03/25 0900) BP: (82-155)/(61-101) 121/81 mmHg (03/25 0900) SpO2:  [100 %] 100 % (03/25 0900) FiO2 (%):  [40 %-100 %] 100 % (03/25 0900) Weight:  [127.3 kg (280 lb 10.3 oz)] 127.3 kg (280 lb 10.3 oz) (03/25 0500) Weight change: -3 kg (-6 lb 9.8 oz) Last BM Date: 04/14/14  Intake/Output from previous day: 03/24 0701 - 03/25 0700 In: 2828.9 [I.V.:1382.2; NG/GT:1396.7; IV Piggyback:50] Out: 5800 [Urine:5800]  PHYSICAL EXAM General appearance: alert, cooperative, no distress and Intubated and sedated Resp: rhonchi bilaterally Cardio: regular rate and rhythm, S1, S2 normal, no murmur, click, rub or gallop GI: soft, non-tender; bowel sounds normal; no masses,  no organomegaly Extremities: extremities normal, atraumatic, no cyanosis or edema  Lab Results:  Results for orders placed or performed during the hospital encounter of 04/13/14 (from the past 48 hour(s))  Glucose, capillary     Status: Abnormal   Collection Time: 04/18/14 11:05 AM  Result Value Ref Range   Glucose-Capillary 381 (H) 70 - 99 mg/dL   Comment 1 Notify RN   Heparin level (unfractionated)     Status: Abnormal   Collection Time: 04/18/14 12:50 PM  Result Value Ref Range   Heparin Unfractionated 0.71 (H) 0.30 - 0.70 IU/mL    Comment:        IF HEPARIN RESULTS ARE BELOW EXPECTED VALUES, AND PATIENT DOSAGE HAS BEEN CONFIRMED, SUGGEST FOLLOW UP TESTING OF ANTITHROMBIN III LEVELS.   Glucose, capillary     Status: Abnormal   Collection Time: 04/18/14  4:09 PM  Result Value Ref Range   Glucose-Capillary 327 (H) 70 - 99 mg/dL  Glucose, capillary     Status: Abnormal   Collection Time: 04/18/14  7:58 PM  Result Value Ref Range    Glucose-Capillary 321 (H) 70 - 99 mg/dL   Comment 1 Notify RN   Glucose, capillary     Status: Abnormal   Collection Time: 04/18/14 11:51 PM  Result Value Ref Range   Glucose-Capillary 308 (H) 70 - 99 mg/dL   Comment 1 Notify RN   Glucose, capillary     Status: Abnormal   Collection Time: 04/19/14  4:06 AM  Result Value Ref Range   Glucose-Capillary 323 (H) 70 - 99 mg/dL   Comment 1 Notify RN   Heparin level (unfractionated)     Status: Abnormal   Collection Time: 04/19/14  4:20 AM  Result Value Ref Range   Heparin Unfractionated 0.26 (L) 0.30 - 0.70 IU/mL    Comment:        IF HEPARIN RESULTS ARE BELOW EXPECTED VALUES, AND PATIENT DOSAGE HAS BEEN CONFIRMED, SUGGEST FOLLOW UP TESTING OF ANTITHROMBIN III LEVELS.   Blood gas, arterial     Status: Abnormal   Collection Time: 04/19/14  4:35 AM  Result Value Ref Range   FIO2 40.00 %   Delivery systems VENTILATOR    Mode PRESSURE REGULATED VOLUME CONTROL    VT 700 mL   Rate 12.0 resp/min   Peep/cpap 5.0 cm H20   pH, Arterial 7.461 (H) 7.350 - 7.450   pCO2 arterial 37.8 35.0 - 45.0 mmHg   pO2, Arterial 103.0 (H) 80.0 - 100.0 mmHg   Bicarbonate 26.6 (H)  20.0 - 24.0 mEq/L   TCO2 23.4 0 - 100 mmol/L   Acid-Base Excess 3.0 (H) 0.0 - 2.0 mmol/L   O2 Saturation 97.5 %   Patient temperature 37.0    Collection site RIGHT RADIAL    Drawn by [id number]    Sample type ARTERIAL    Allens test (pass/fail) PASS PASS  Glucose, capillary     Status: Abnormal   Collection Time: 04/19/14  7:40 AM  Result Value Ref Range   Glucose-Capillary 342 (H) 70 - 99 mg/dL   Comment 1 Notify RN   Glucose, capillary     Status: Abnormal   Collection Time: 04/19/14 11:15 AM  Result Value Ref Range   Glucose-Capillary 296 (H) 70 - 99 mg/dL   Comment 1 Notify RN   Heparin level (unfractionated)     Status: None   Collection Time: 04/19/14  4:04 PM  Result Value Ref Range   Heparin Unfractionated 0.34 0.30 - 0.70 IU/mL    Comment:        IF HEPARIN  RESULTS ARE BELOW EXPECTED VALUES, AND PATIENT DOSAGE HAS BEEN CONFIRMED, SUGGEST FOLLOW UP TESTING OF ANTITHROMBIN III LEVELS.   Glucose, capillary     Status: Abnormal   Collection Time: 04/19/14  4:16 PM  Result Value Ref Range   Glucose-Capillary 289 (H) 70 - 99 mg/dL   Comment 1 Notify RN   Glucose, capillary     Status: Abnormal   Collection Time: 04/19/14  7:49 PM  Result Value Ref Range   Glucose-Capillary 274 (H) 70 - 99 mg/dL   Comment 1 Notify RN   Glucose, capillary     Status: Abnormal   Collection Time: 04/20/14 12:30 AM  Result Value Ref Range   Glucose-Capillary 288 (H) 70 - 99 mg/dL   Comment 1 Notify RN   Glucose, capillary     Status: Abnormal   Collection Time: 04/20/14  4:24 AM  Result Value Ref Range   Glucose-Capillary 239 (H) 70 - 99 mg/dL   Comment 1 Notify RN   Heparin level (unfractionated)     Status: None   Collection Time: 04/20/14  4:28 AM  Result Value Ref Range   Heparin Unfractionated 0.58 0.30 - 0.70 IU/mL    Comment:        IF HEPARIN RESULTS ARE BELOW EXPECTED VALUES, AND PATIENT DOSAGE HAS BEEN CONFIRMED, SUGGEST FOLLOW UP TESTING OF ANTITHROMBIN III LEVELS.   Basic metabolic panel     Status: Abnormal   Collection Time: 04/20/14  4:28 AM  Result Value Ref Range   Sodium 148 (H) 135 - 145 mmol/L    Comment: DELTA CHECK NOTED   Potassium 3.2 (L) 3.5 - 5.1 mmol/L   Chloride 104 96 - 112 mmol/L   CO2 33 (H) 19 - 32 mmol/L   Glucose, Bld 232 (H) 70 - 99 mg/dL   BUN 85 (H) 6 - 23 mg/dL   Creatinine, Ser 2.17 (H) 0.50 - 1.35 mg/dL   Calcium 8.9 8.4 - 10.5 mg/dL   GFR calc non Af Amer 31 (L) >90 mL/min   GFR calc Af Amer 36 (L) >90 mL/min    Comment: (NOTE) The eGFR has been calculated using the CKD EPI equation. This calculation has not been validated in all clinical situations. eGFR's persistently <90 mL/min signify possible Chronic Kidney Disease.    Anion gap 11 5 - 15  CBC     Status: Abnormal   Collection Time: 04/20/14   4:28 AM  Result Value Ref Range   WBC 11.3 (H) 4.0 - 10.5 K/uL   RBC 3.75 (L) 4.22 - 5.81 MIL/uL   Hemoglobin 11.5 (L) 13.0 - 17.0 g/dL   HCT 35.5 (L) 39.0 - 52.0 %   MCV 94.7 78.0 - 100.0 fL   MCH 30.7 26.0 - 34.0 pg   MCHC 32.4 30.0 - 36.0 g/dL   RDW 13.4 11.5 - 15.5 %   Platelets 143 (L) 150 - 400 K/uL  Blood gas, arterial     Status: Abnormal   Collection Time: 04/20/14  4:45 AM  Result Value Ref Range   FIO2 40.00 %   Delivery systems VENTILATOR    Mode PRESSURE REGULATED VOLUME CONTROL    VT 700 mL   Rate 12.0 resp/min   Peep/cpap 5.0 cm H20   pH, Arterial 7.480 (H) 7.350 - 7.450   pCO2 arterial 38.6 35.0 - 45.0 mmHg   pO2, Arterial 115.0 (H) 80.0 - 100.0 mmHg   Bicarbonate 28.5 (H) 20.0 - 24.0 mEq/L   TCO2 24.2 0 - 100 mmol/L   Acid-Base Excess 4.9 (H) 0.0 - 2.0 mmol/L   O2 Saturation 98.2 %   Patient temperature 37.0    Collection site RIGHT RADIAL    Drawn by [id number]    Sample type ARTERIAL    Allens test (pass/fail) PASS PASS  Glucose, capillary     Status: Abnormal   Collection Time: 04/20/14  7:22 AM  Result Value Ref Range   Glucose-Capillary 139 (H) 70 - 99 mg/dL   Comment 1 Notify RN    Comment 2 Document in Chart     ABGS  Recent Labs  04/20/14 0445  PHART 7.480*  PO2ART 115.0*  TCO2 24.2  HCO3 28.5*   CULTURES Recent Results (from the past 240 hour(s))  MRSA PCR Screening     Status: Abnormal   Collection Time: 04/13/14  4:00 PM  Result Value Ref Range Status   MRSA by PCR POSITIVE (A) NEGATIVE Final    Comment:        The GeneXpert MRSA Assay (FDA approved for NASAL specimens only), is one component of a comprehensive MRSA colonization surveillance program. It is not intended to diagnose MRSA infection nor to guide or monitor treatment for MRSA infections. RESULT CALLED TO, READ BACK BY AND VERIFIED WITH:  CUMMINGS,R @ 2205 ON 04/13/14 BY WOODIE,J   Culture, blood (x 2)     Status: None   Collection Time: 04/16/14 10:06 AM  Result  Value Ref Range Status   Specimen Description BLOOD PORTA CATH DRAWN BY RN  Final   Special Requests BOTTLES DRAWN AEROBIC AND ANAEROBIC 6CC  Final   Culture   Final    VIRIDANS STREPTOCOCCUS Note: Gram Stain Report Called to,Read Back By and Verified With: [email or web address] Performed at [facility] Performed at [facility]    Report Status 04/19/2014 FINAL  Final   Organism ID, Bacteria VIRIDANS STREPTOCOCCUS  Final      Susceptibility   Viridans streptococcus - MIC (ETEST)*    PENICILLIN .064 SENSITIVE Sensitive     * VIRIDANS STREPTOCOCCUS  Culture, blood (x 2)     Status: None   Collection Time: 04/16/14 10:16 AM  Result Value Ref Range Status   Specimen Description BLOOD RIGHT ANTECUBITAL  Final   Special Requests BOTTLES DRAWN AEROBIC AND ANAEROBIC 10CC  Final   Culture   Final    VIRIDANS STREPTOCOCCUS Note: SUSCEPTIBILITIES PERFORMED  ON PREVIOUS CULTURE WITHIN THE LAST 5 DAYS. Note: Gram Stain Report Called to,Read Back By and Verified With: MISTY MCDANIEL RN 04/17/14 0700 BY RESSEGGER R Performed at [facility] Performed at [facility]    Report Status 04/19/2014 FINAL  Final   Studies/Results: Dg Chest 1 View  04/19/2014   CLINICAL DATA:  Endotracheal and NG tube placement. Patient pulled endotracheal tube.  EXAM: CHEST  1 VIEW  COMPARISON:  Chest radiograph earlier this day at 1619 hour  FINDINGS: The endotracheal tube is high, 12.9 cm from the carina above the thoracic inlet. Advancement of at least 5 cm recommended. Enteric tube is in place, tip and side port below the diaphragm. Lung volumes remain low. Left upper extremity PICC remains in place in the proximal SVC. Cardiomegaly, vascular congestion, and bibasilar atelectasis are unchanged from prior. No large pleural effusion or pneumothorax.  IMPRESSION: Endotracheal tube is high in position, 12.9 cm from the carina. Advancement of at least 5 cm recommended.    Electronically Signed   By: Jeb Levering M.D.   On: 04/19/2014 00:13   Dg Chest Port 1 View  04/19/2014   CLINICAL DATA:  Difficult intubation, endotracheal tube placement  EXAM: PORTABLE CHEST - 1 VIEW  COMPARISON:  04/18/2014 at 2330 hour  FINDINGS: The endotracheal tube has been advanced and is now 8 cm from the carina. Enteric tube and left upper extremity PICC remain in place. Cardiomegaly, mild vascular congestion, and bibasilar atelectasis are unchanged.  IMPRESSION: Endotracheal tube has been advanced and is now 8 cm from the carina.   Electronically Signed   By: Jeb Levering M.D.   On: 04/19/2014 01:28   Dg Chest Port 1 View  04/18/2014   CLINICAL DATA:  Left PICC line placement.  Intubated.  EXAM: PORTABLE CHEST - 1 VIEW  COMPARISON:  04/18/2014  FINDINGS: Left PICC line tip is obscured over the spine but appears to extend into the proximal SVC level. Exam is rotated to the left. Endotracheal to 10.2 cm above the carina. NG tube in the stomach. Stable cardiomegaly with vascular congestion and basilar atelectasis. Low lung volumes persist. No enlarging effusion or pneumothorax.  IMPRESSION: Left PICC line tip proximal SVC level.  Stable chest exam.   Electronically Signed   By: Jerilynn Mages.  Shick M.D.   On: 04/18/2014 16:47   Dg Chest Port 1 View  04/18/2014   CLINICAL DATA:  59 year old male with PICC line placement. Subsequent encounter.  EXAM: PORTABLE CHEST - 1 VIEW  COMPARISON:  04/17/2014.  FINDINGS: Left-sided PICC line is in place. The tip is difficult to adequately assess but appears to be at the level of formation of the superior vena cava.  Endotracheal tube tip is 6.5 cm above the carina.  Nasogastric tube courses below the diaphragm. Tip is not included on the present exam.  Cardiomegaly.  Pulmonary vascular prominence most notable centrally.  Slightly limited evaluation of the lung bases secondary to degree of inspiration.  Tortuous aorta.  Bilateral acromioclavicular joint  degenerative changes.  IMPRESSION: Left-sided PICC line is in place. The tip is difficult to adequately assess but appears to be at the level of formation of the superior vena cava.  Cardiomegaly.  Pulmonary vascular prominence most notable centrally.   Electronically Signed   By: Genia Del M.D.   On: 04/18/2014 10:22    Medications:  Prior to Admission:  Prescriptions prior to admission  Medication Sig Dispense Refill Last Dose  . insulin detemir (  LEVEMIR) 100 UNIT/ML injection Inject 60 Units into the skin daily.     04/12/2014 at Unknown time  . metFORMIN (GLUCOPHAGE) 500 MG tablet Take 500 mg by mouth daily.     04/12/2014 at Unknown time  . PRESCRIPTION MEDICATION Blood pressure med awaiting va clairfication      Scheduled: . sodium chloride   Intravenous Once  . antiseptic oral rinse  7 mL Mouth Rinse QID  . aspirin  81 mg Per Tube Daily  . cefTRIAXone (ROCEPHIN)  IV  1 g Intravenous Q24H  . chlorhexidine  15 mL Mouth Rinse BID  . famotidine (PEPCID) IV  20 mg Intravenous Q24H  . feeding supplement (PRO-STAT SUGAR FREE 64)  30 mL Per Tube BID  . folic acid  1 mg Per Tube Daily  . insulin aspart  0-15 Units Subcutaneous 6 times per day  . insulin detemir  20 Units Subcutaneous Daily  . methylPREDNISolone (SOLU-MEDROL) injection  40 mg Intravenous Q12H  . multivitamin with minerals  1 tablet Per Tube Daily  . sodium chloride  10-40 mL Intracatheter Q12H  . sodium chloride  10-40 mL Intracatheter Q12H  . sodium chloride  3 mL Intravenous Q12H  . sodium chloride      . thiamine  100 mg Oral Daily   Or  . thiamine  100 mg Intravenous Daily   Continuous: . dexmedetomidine 1.1 mcg/kg/hr (04/20/14 0915)  . dextrose 5 % with KCl 20 mEq / L    . feeding supplement (VITAL AF 1.2 CAL) 1,000 mL (04/20/14 0900)  . heparin 900 Units/hr (04/20/14 0900)   QMG:NOIBBCWUG, diphenhydrAMINE, fentaNYL, fentaNYL, naphazoline, ondansetron **OR** ondansetron (ZOFRAN) IV, sodium chloride, sodium  chloride  Assesment: He has acute hypoxic respiratory failure. He had been markedly tachycardic and that has improved. He is tolerating weaning very well at this point.  He has angioedema presumably from ACE inhibitor and although his tongue is less swollen it is still too swollen to try to extubate him now Principal Problem:   Acute respiratory failure with hypoxemia Active Problems:   Diabetes type 2, controlled   Angioedema   Acute combined systolic and diastolic CHF, NYHA class 4   Delirium tremens   DVT (deep venous thrombosis)   Ventilator dependence   Severe sepsis    Plan: Since he is doing so much better with his breathing and tolerating weaning at this point and looks like he's approaching being able to come off the ventilator I'm going to go ahead and give him 2 units of fresh frozen plasma today. If this helps shrink his tongue down then he could potentially be extubated tomorrow    LOS: 7 days   Anquinette Pierro L 04/20/2014, 9:40 AM

## 2014-04-20 NOTE — Progress Notes (Signed)
ANTICOAGULATION CONSULT NOTE  Pharmacy Consult for Heparin Indication: DVT  Allergies  Allergen Reactions  . Ace Inhibitors Swelling  . Other Hives    wool   Patient Measurements: Height: 6\' 1"  (185.4 cm) Weight: 280 lb 10.3 oz (127.3 kg) IBW/kg (Calculated) : 79.9 Heparin Dosing Weight: 111.4 kg  Vital Signs: Temp: 98.5 F (36.9 C) (03/25 0736) Temp Source: Axillary (03/25 0736) BP: 124/79 mmHg (03/25 0600) Pulse Rate: 97 (03/25 0600)  Labs:  Recent Labs  04/18/14 0032 04/18/14 0450  04/19/14 0420 04/19/14 1604 04/20/14 0428  HGB 13.0 12.7*  --   --   --  11.5*  HCT 37.5* 36.5*  --   --   --  35.5*  PLT 125* 139*  --   --   --  143*  HEPARINUNFRC  --  0.23*  < > 0.26* 0.34 0.58  CREATININE  --  3.46*  --   --   --  2.17*  < > = values in this interval not displayed. Estimated Creatinine Clearance: 50 mL/min (by C-G formula based on Cr of 2.17).  Medical History: Past Medical History  Diagnosis Date  . Hypertension   . Diabetes mellitus    Medications:  Scheduled:  . antiseptic oral rinse  7 mL Mouth Rinse QID  . aspirin  81 mg Per Tube Daily  . cefTRIAXone (ROCEPHIN)  IV  1 g Intravenous Q24H  . chlorhexidine  15 mL Mouth Rinse BID  . famotidine (PEPCID) IV  20 mg Intravenous Q24H  . feeding supplement (PRO-STAT SUGAR FREE 64)  30 mL Per Tube BID  . folic acid  1 mg Per Tube Daily  . insulin aspart  0-15 Units Subcutaneous 6 times per day  . insulin detemir  20 Units Subcutaneous Daily  . methylPREDNISolone (SOLU-MEDROL) injection  40 mg Intravenous Q12H  . multivitamin with minerals  1 tablet Per Tube Daily  . sodium chloride  10-40 mL Intracatheter Q12H  . sodium chloride  10-40 mL Intracatheter Q12H  . sodium chloride  3 mL Intravenous Q12H  . sodium chloride      . thiamine  100 mg Oral Daily   Or  . thiamine  100 mg Intravenous Daily   Assessment: 59 yo obese M with + BLE DVT.  He is currently intubated on Precedex and has impaired renal &  liver function.   Heparin has not been stable despite multiple rate changes.  He is now below goal range.   +bleeding from PICC noted.  H/H trending down some, PLTs are OK  Goal of Therapy:  Heparin level 0.3-0.7 units/ml  Monitor platelets by anticoagulation protocol: Yes   Plan:  Continue Heparin infusion at 900 units/hr Heparin level and CBC daily while on Heparin  Jory Welke A 04/20/2014,8:51 AM

## 2014-04-20 NOTE — Progress Notes (Signed)
Pt is on wean 5/5. The Pt is doing good but his tongue is still swollen; which could affect his breathing if extubated. RT will work the Pt some today. The Pt's volumes are a little low (389-410), but pt is still on some sedation due to his high anxiety.

## 2014-04-20 NOTE — Progress Notes (Signed)
Nutrition Follow -up  INTERVENTION: Continue Vital 1.2 @ 50 ml/hr via OGT with 30 ml ProStat added BID.   NUTRITION DIAGNOSIS: Inadequate oral intake related to inability to eat as evidenced by Npo status; ongoing  Goal: 1.Enteral nutrition to provide 60-70% of estimated calorie needs and 100% of estimated protein needs; met. 2. Pt will successfully transition to meet nutrition needs via oral intake once he is weaned; pending.  Monitor:  -Tolerance of enteral feeding, I/O's, weight  trend,  lab results. -Transisiton to oral intake as appropriate from a medical standpoint  59 y.o. male  Admitting Dx: Acute respiratory failure with hypoxemia  ASSESSMENT: Patient remains intubated at this time but respiratory is working with him for potential weaning (however  his tongue is still swollen). He's tolerating the tube feeding at goal rate which is meeting his current nutrition goals.  No BM since 3/19. Recommend consider adding bowel regimen. Abnormal labs: BUN trending up, Cr. 2.17 (improving), sodium 148 elevated, glucose elevated at 232.  MV: 11.5 L/min Temp (24hrs), Avg:98.1 F (36.7 C), Min:97.2 F (36.2 C), Max:99.4 F (37.4 C)   Height: Ht Readings from Last 1 Encounters:  04/18/14 $RemoveB'6\' 1"'[id number]$  (1.854 m)    Weight: Wt Readings from Last 1 Encounters:  04/20/14 280 lb 10.3 oz (127.3 kg)    Ideal Body Weight: 184# (83.6 kg)  % Ideal Body Weight: 167%  Wt Readings from Last 10 Encounters:  04/20/14 280 lb 10.3 oz (127.3 kg)  12/02/10 280 lb (127.007 kg)    Usual Body Weight: 280-290#  % Usual Body Weight:  105%  BMI:  Body mass index is 37.03 kg/(m^2). (skewed due to edema)  Estimated Nutritional Needs: Kcal: 1650 Protein: 125 gr Fluid: 1.5 liters daily   Skin: intact  Diet Order: Diet NPO time specified  EDUCATION NEEDS: -No education needs identified at this time   Intake/Output Summary (Last 24 hours) at 04/20/14 0936 Last data filed at 04/20/14 0701  Gross per 24 hour  Intake 2762.9 ml  Output   4450 ml  Net -1687.1 ml    Last BM: 3/19  Labs:   Recent Labs Lab 04/15/14 0434 04/16/14 0900 04/17/14 0426 04/18/14 0450 04/20/14 0428  NA 133* 133* 134* 133* 148*  K 4.0 5.0 4.1 4.1 3.2*  CL 99 99 99 96 104  CO2 $Re'22 22 20 23 'nJX$ 33*  BUN 47* 65* 77* 84* 85*  CREATININE 3.07* 4.26* 4.25* 3.46* 2.17*  CALCIUM 9.6 8.9 9.0 8.7 8.9  MG 1.8 1.7 2.1  --   --   GLUCOSE 79 154* 109* 271* 232*    CBG (last 3)   Recent Labs  04/20/14 0030 04/20/14 0424 04/20/14 0722  GLUCAP 288* 239* 139*    Scheduled Meds: . sodium chloride   Intravenous Once  . antiseptic oral rinse  7 mL Mouth Rinse QID  . aspirin  81 mg Per Tube Daily  . cefTRIAXone (ROCEPHIN)  IV  1 g Intravenous Q24H  . chlorhexidine  15 mL Mouth Rinse BID  . famotidine (PEPCID) IV  20 mg Intravenous Q24H  . feeding supplement (PRO-STAT SUGAR FREE 64)  30 mL Per Tube BID  . folic acid  1 mg Per Tube Daily  . insulin aspart  0-15 Units Subcutaneous 6 times per day  . insulin detemir  20 Units Subcutaneous Daily  . methylPREDNISolone (SOLU-MEDROL) injection  40 mg Intravenous Q12H  . multivitamin with minerals  1 tablet Per Tube Daily  . sodium chloride  10-40 mL Intracatheter Q12H  . sodium chloride  10-40 mL Intracatheter Q12H  . sodium chloride  3 mL Intravenous Q12H  . sodium chloride      . thiamine  100 mg Oral Daily   Or  . thiamine  100 mg Intravenous Daily    Continuous Infusions: . dexmedetomidine 1.1 mcg/kg/hr (04/20/14 0915)  . dextrose 5 % with KCl 20 mEq / L    . feeding supplement (VITAL AF 1.2 CAL) 1,000 mL (04/20/14 0900)  . heparin 900 Units/hr (04/20/14 0900)    Past Medical History  Diagnosis Date  . Hypertension   . Diabetes mellitus     Past Surgical History  Procedure Laterality Date  . Hand surgery      Colman Cater MS,RD,CSG,LDN Office: #[phone] Pager: [phone]

## 2014-04-20 NOTE — Progress Notes (Signed)
Inpatient Diabetes Program Recommendations  AACE/ADA: New Consensus Statement on Inpatient Glycemic Control (2013)  Target Ranges:  Prepandial:   less than 140 mg/dL      Peak postprandial:   less than 180 mg/dL (1-2 hours)      Critically ill patients:  140 - 180 mg/dL   Results for Geri SeminoleDAMS, Dylyn E (MRN [id number]) as of 04/20/2014 10:34  Ref. Range 04/18/2014 23:51 04/19/2014 04:06 04/19/2014 07:40 04/19/2014 11:15 04/19/2014 16:16 04/19/2014 19:49 04/20/2014 00:30 04/20/2014 04:24 04/20/2014 07:22  Glucose-Capillary Latest Range: 70-99 mg/dL 409308 (H) 811323 (H) 914342 (H) 296 (H) 289 (H) 274 (H) 288 (H) 239 (H) 139 (H)   Diabetes history: DM2 Outpatient Diabetes medications: Levemir 60 units daily, Metformin 500 mg daily Current orders for Inpatient glycemic control: Levemir 20 units daily, Novolog 0-15 units Q4H  Inpatient Diabetes Program Recommendations Insulin - Basal: Please consider increasing Levemir to 40 units daily. Patient has already received Levemir 20 units this morning. If Levemir is increased as requested, please consider ordering a one time dose of an additional Levemir 20 units for now so patient receives a total of Levemir 40 units for today. Please keep in mind that Levemir may need to be adjusted once steroids are tapered and tube feeding is discontinued once patient is extubated. Correction (SSI): Patient received a total of Novolog 57 units on 04/19/14 for correction. Patient has received a total of Novolog 16 units so far today. Insulin - Meal Coverage: Please consider ordering Novolog 3 units Q4H for tube feeding coverage.  Thanks, Orlando PennerMarie Alacia Rehmann, RN, MSN, CCRN, CDE Diabetes Coordinator Inpatient Diabetes Program [phone] (Team Pager from 8am to 5pm) [phone] ([facility] office) [phone] [facility] office)

## 2014-04-21 DIAGNOSIS — A419 Sepsis, unspecified organism: Secondary | ICD-10-CM

## 2014-04-21 DIAGNOSIS — R6520 Severe sepsis without septic shock: Secondary | ICD-10-CM

## 2014-04-21 LAB — BASIC METABOLIC PANEL
Anion gap: 11 (ref 5–15)
BUN: 88 mg/dL — AB (ref 6–23)
CALCIUM: 8.8 mg/dL (ref 8.4–10.5)
CO2: 33 mmol/L — ABNORMAL HIGH (ref 19–32)
CREATININE: 1.95 mg/dL — AB (ref 0.50–1.35)
Chloride: 103 mmol/L (ref 96–112)
GFR calc Af Amer: 41 mL/min — ABNORMAL LOW (ref >90)
GFR calc non Af Amer: 35 mL/min — ABNORMAL LOW (ref >90)
GLUCOSE: 317 mg/dL — AB (ref 70–99)
Potassium: 3.4 mmol/L — ABNORMAL LOW (ref 3.5–5.1)
Sodium: 147 mmol/L — ABNORMAL HIGH (ref 135–145)

## 2014-04-21 LAB — CBC
HEMATOCRIT: 32.2 % — AB (ref 39.0–52.0)
HEMOGLOBIN: 10.6 g/dL — AB (ref 13.0–17.0)
MCH: 31.7 pg (ref 26.0–34.0)
MCHC: 32.9 g/dL (ref 30.0–36.0)
MCV: 96.4 fL (ref 78.0–100.0)
Platelets: 135 10*3/uL — ABNORMAL LOW (ref 150–400)
RBC: 3.34 MIL/uL — ABNORMAL LOW (ref 4.22–5.81)
RDW: 13.5 % (ref 11.5–15.5)
WBC: 10.8 10*3/uL — AB (ref 4.0–10.5)

## 2014-04-21 LAB — BLOOD GAS, ARTERIAL
ACID-BASE EXCESS: 6.8 mmol/L — AB (ref 0.0–2.0)
Bicarbonate: 29.9 mEq/L — ABNORMAL HIGH (ref 20.0–24.0)
Drawn by: 38235
FIO2: 0.4 %
O2 Saturation: 97.9 %
PCO2 ART: 36.6 mmHg (ref 35.0–45.0)
PEEP: 5 cmH2O
Patient temperature: 37
RATE: 12 resp/min
TCO2: 27 mmol/L (ref 0–100)
VT: 700 mL
pH, Arterial: 7.523 — ABNORMAL HIGH (ref 7.350–7.450)
pO2, Arterial: 98.3 mmHg (ref 80.0–100.0)

## 2014-04-21 LAB — GLUCOSE, CAPILLARY
GLUCOSE-CAPILLARY: 314 mg/dL — AB (ref 70–99)
GLUCOSE-CAPILLARY: 316 mg/dL — AB (ref 70–99)
GLUCOSE-CAPILLARY: 364 mg/dL — AB (ref 70–99)
Glucose-Capillary: 329 mg/dL — ABNORMAL HIGH (ref 70–99)

## 2014-04-21 LAB — PREPARE FRESH FROZEN PLASMA
UNIT DIVISION: 0
Unit division: 0

## 2014-04-21 LAB — HEPARIN LEVEL (UNFRACTIONATED): Heparin Unfractionated: 0.63 IU/mL (ref 0.30–0.70)

## 2014-04-21 MED ORDER — FUROSEMIDE 10 MG/ML IJ SOLN
40.0000 mg | Freq: Once | INTRAMUSCULAR | Status: AC
  Administered 2014-04-21: 40 mg via INTRAVENOUS
  Filled 2014-04-21: qty 4

## 2014-04-21 MED ORDER — DILTIAZEM HCL 100 MG IV SOLR
5.0000 mg/h | INTRAVENOUS | Status: DC
  Administered 2014-04-21: 5 mg/h via INTRAVENOUS
  Administered 2014-04-22 (×3): 15 mg/h via INTRAVENOUS
  Administered 2014-04-23: 5 mg/h via INTRAVENOUS
  Administered 2014-04-23: 15 mg/h via INTRAVENOUS
  Filled 2014-04-21: qty 100

## 2014-04-21 MED ORDER — SODIUM CHLORIDE 0.9 % IV SOLN
Freq: Once | INTRAVENOUS | Status: AC
  Administered 2014-04-21: 10:00:00 via INTRAVENOUS

## 2014-04-21 MED ORDER — INSULIN ASPART 100 UNIT/ML ~~LOC~~ SOLN
3.0000 [IU] | SUBCUTANEOUS | Status: DC
  Administered 2014-04-21 – 2014-04-24 (×18): 3 [IU] via SUBCUTANEOUS

## 2014-04-21 MED ORDER — LORAZEPAM 2 MG/ML IJ SOLN
1.0000 mg | INTRAMUSCULAR | Status: DC | PRN
  Administered 2014-04-21 – 2014-04-22 (×3): 1 mg via INTRAVENOUS
  Filled 2014-04-21 (×3): qty 1

## 2014-04-21 MED ORDER — INSULIN DETEMIR 100 UNIT/ML ~~LOC~~ SOLN
40.0000 [IU] | Freq: Every day | SUBCUTANEOUS | Status: DC
  Administered 2014-04-22: 40 [IU] via SUBCUTANEOUS
  Filled 2014-04-21 (×2): qty 0.4

## 2014-04-21 NOTE — Progress Notes (Signed)
Patient self extubated ET tube around 20:05. Witnessed by nursing. Patient assessed and placed on 40% venturi mask. Tongue and mouth appear not to be swollen at this time. Patient in no respiratory distress and O2 sats are great at 100%.  Patient was to be extubated tomorrow.  Will continue to monitor patient throughout the night.

## 2014-04-21 NOTE — Progress Notes (Signed)
ANTICOAGULATION CONSULT NOTE  Pharmacy Consult for Heparin Indication: DVT  Allergies  Allergen Reactions  . Ace Inhibitors Swelling  . Other Hives    wool   Patient Measurements: Height: 6\' 1"  (185.4 cm) Weight: 282 lb 6.6 oz (128.1 kg) IBW/kg (Calculated) : 79.9 Heparin Dosing Weight: 111.4 kg  Vital Signs: Temp: 97.5 F (36.4 C) (03/26 0500) Temp Source: Axillary (03/26 0500) BP: 102/65 mmHg (03/26 0800) Pulse Rate: 93 (03/26 0800)  Labs:  Recent Labs  04/19/14 1604 04/20/14 0428 04/21/14 0435  HGB  --  11.5* 10.6*  HCT  --  35.5* 32.2*  PLT  --  143* 135*  HEPARINUNFRC 0.34 0.58 0.63  CREATININE  --  2.17* 1.95*   Estimated Creatinine Clearance: 55.8 mL/min (by C-G formula based on Cr of 1.95).  Medical History: Past Medical History  Diagnosis Date  . Hypertension   . Diabetes mellitus    Medications:  Scheduled:  . sodium chloride   Intravenous Once  . antiseptic oral rinse  7 mL Mouth Rinse QID  . aspirin  81 mg Per Tube Daily  . cefTRIAXone (ROCEPHIN)  IV  2 g Intravenous Q24H  . chlorhexidine  15 mL Mouth Rinse BID  . famotidine (PEPCID) IV  20 mg Intravenous Q24H  . feeding supplement (PRO-STAT SUGAR FREE 64)  30 mL Per Tube BID  . folic acid  1 mg Per Tube Daily  . insulin aspart  0-15 Units Subcutaneous 6 times per day  . insulin detemir  20 Units Subcutaneous Daily  . methylPREDNISolone (SOLU-MEDROL) injection  40 mg Intravenous Q12H  . multivitamin with minerals  1 tablet Per Tube Daily  . sodium chloride  10-40 mL Intracatheter Q12H  . sodium chloride  10-40 mL Intracatheter Q12H  . sodium chloride  3 mL Intravenous Q12H  . thiamine  100 mg Oral Daily   Or  . thiamine  100 mg Intravenous Daily   Assessment: 59 yo obese M with + BLE DVT.  He is currently intubated on Precedex and has impaired renal & liver function.   Heparin has stabilized and Heparin level is at goal x 3 consecutive checks.   +bleeding from PICC noted.  H/H trending  down some, PLTs are OK  Goal of Therapy:  Heparin level 0.3-0.7 units/ml  Monitor platelets by anticoagulation protocol: Yes   Plan:  Continue Heparin infusion at 900 units/hr Heparin level and CBC daily while on Heparin  Markia Kyer A 04/21/2014,8:49 AM

## 2014-04-21 NOTE — Progress Notes (Addendum)
[facility] PROGRESS NOTE  Gabriel Reynolds:[id number] DOB: [birth year] DOA: 04/13/2014 PCP: Pcp Not In System  Assessment/Plan: Ventilatory dependent hypoxemic respiratory failure -Etiology likely a combination of decompensated CHF, angioedema and possibly DTs.  -Self extubated 3/24 requiring re-intubation. -Remains intubated today, but appears ready for weaning. Tolerated PS for a few hours yesterday. -Dr. Juanetta Gosling has requested FFP for angioedema to see if this will allow extubation.  Acute combined systolic CHF/Biventricular Failure -Was able to wean off  dobutamine drip 3/24. -Renal has DC'd lasix drip due to hypernatremia and has started D5. -Appreciate cardiology following. -Is 11.3 L negative. -Consider starting lasix once Na has improved. -Will receive 40 mg IV lasix following 2 units of FFP to prevent overload issues.  Sepsis with bacteremia -2 out of 2 cultures with strep viridans. -Will narrow antibiotics to rocephin 3/24. -Possible source appears to be pneumonia giving left base consolidation on chest x-ray.  Hospital-acquired pneumonia/aspiration pneumonia -See above for details.  Angioedema -Tongue swelling appears improved. -Continue steroids/Benadryl. -Presumed a late hypersensitivity reaction to ACE-I. -FFP to be given 3/25 and 3/26 for angioedema.  Alcohol abuse -DTs may have played a role in his acute respiratory decompensation over the weekend. -Continue thiamine/folate.  Bilateral DVTs  -Continue heparin drip. -Unable to confirm PE at this point given patient's unstable illness and renal dysfunction, but suspect this may be playing a role in his RV failure. -Transition to oral anticoagulants once extubated.  Acute renal failure -Improving  good urine output. -Have no records to determine chronicity of renal failure. -Cr down to 1.95 3/26.  Hypernatremia -Na down to 147 3/26. -renal has stopped diuretics and started on a D5 drip. -Need  to be careful with IVF given biventricular heart failure.  DM II -CBGs increasing. -Increase levemir to 40 units. -Add tube feeding coverage 3 units.   Code Status: Full code Family Communication: Wife [name] at bedside updated on plan of care  Disposition Plan: Keep in ICU   Consultants:  Cardiology  Pulmonary   Antibiotics:  Rocephin  Subjective: Somewhat alert on ventilator.  Objective: Filed Vitals:   04/21/14 0857 04/21/14 0900 04/21/14 0938 04/21/14 0953  BP:   Pulse: 89 97 95 92  Temp:   98.5 F (36.9 C) 97.7 F (36.5 C)  TempSrc:   Oral Oral  Resp: Height:      Weight:      SpO2: 100% 100% 100% 100%    Intake/Output Summary (Last 24 hours) at 04/21/14 1024 Last data filed at 04/21/14 0500  Gross per 24 hour  Intake 4084.38 ml  Output   4850 ml  Net -765.62 ml   Filed Weights   04/19/14 0500 04/20/14 0500 04/21/14 0500  Weight: 130.3 kg (287 lb 4.2 oz) 127.3 kg (280 lb 10.3 oz) 128.1 kg (282 lb 6.6 oz)    Exam:   General:  awake  Cardiovascular: Regular rate and rhythm  Respiratory: Decreased left base sounds, no wheezes  Abdomen: Soft, nontender, nondistended, positive bowel sounds  Extremities: 3+ pitting edema on right, 1+ on left  Neurologic:  Sedated on vent  Data Reviewed: Basic Metabolic Panel:  Recent Labs Lab 04/15/14 0434 04/16/14 0900 04/17/14 0426 04/18/14 0450 04/20/14 0428 04/21/14 0435  NA 133* 133* 134* 133* 148* 147*  K 4.0 5.0 4.1 4.1 3.2* 3.4*  CL 99 99 99 96 104 103  CO2 33* 33*  GLUCOSE 79 154* 109*  271* 232* 317*  BUN 47* 65* 77* 84* 85* 88*  CREATININE 3.07* 4.26* 4.25* 3.46* 2.17* 1.95*  CALCIUM 9.6 8.9 9.0 8.7 8.9 8.8  MG 1.8 1.7 2.1  --   --   --    Liver Function Tests:  Recent Labs Lab 04/15/14 0434 04/16/14 0900  AST 43* 97*  ALT 28 54*  ALKPHOS 91 81  BILITOT 1.4* 1.8*  PROT 7.2 6.4  ALBUMIN 3.7 3.1*    Recent Labs Lab 04/15/14 2000    LIPASE 25   No results for input(s): AMMONIA in the last 168 hours. CBC:  Recent Labs Lab 04/16/14 1006 04/17/14 0426 04/18/14 0032 04/18/14 0450 04/20/14 0428 04/21/14 0435  WBC DUPLICATE 9.1 7.7 8.6 11.3* 10.8*  NEUTROABS 6.4  --  6.4  --   --   --   HGB DUPLICATE 14.2 13.0 12.7* 11.5* 10.6*  HCT DUPLICATE 40.8 37.5* 36.5* 35.5* 32.2*  MCV DUPLICATE 92.1 91.9 92.2 94.7 96.4  PLT DUPLICATE 121* 125* 139* 143* 135*   Cardiac Enzymes: No results for input(s): CKTOTAL, CKMB, CKMBINDEX, TROPONINI in the last 168 hours. BNP (last 3 results)  Recent Labs  04/13/14 1110  BNP 617.0*    ProBNP (last 3 results) No results for input(s): PROBNP in the last 8760 hours.  CBG:  Recent Labs Lab 04/20/14 1605 04/20/14 2030 04/21/14 0021 04/21/14 0447 04/21/14 0800  GLUCAP 267* 279* 316* 314* 329*    Recent Results (from the past 240 hour(s))  MRSA PCR Screening     Status: Abnormal   Collection Time: 04/13/14  4:00 PM  Result Value Ref Range Status   MRSA by PCR POSITIVE (A) NEGATIVE Final    Comment:        The GeneXpert MRSA Assay (FDA approved for NASAL specimens only), is one component of a comprehensive MRSA colonization surveillance program. It is not intended to diagnose MRSA infection nor to guide or monitor treatment for MRSA infections. RESULT CALLED TO, READ BACK BY AND VERIFIED WITH:  CUMMINGS,R @ 2205 ON 04/13/14 BY WOODIE,J   Culture, blood (x 2)     Status: None   Collection Time: 04/16/14 10:06 AM  Result Value Ref Range Status   Specimen Description BLOOD PORTA CATH DRAWN BY RN  Final   Special Requests BOTTLES DRAWN AEROBIC AND ANAEROBIC 6CC  Final   Culture   Final    VIRIDANS STREPTOCOCCUS Note: Gram Stain Report Called to,Read Back By and Verified With: MCDANIEL M. AT 0700 BY RESSEGGER.R Performed at [facility] Performed at [facility]    Report Status 04/19/2014 FINAL  Final   Organism ID, Bacteria VIRIDANS  STREPTOCOCCUS  Final      Susceptibility   Viridans streptococcus - MIC (ETEST)*    PENICILLIN .064 SENSITIVE Sensitive     * VIRIDANS STREPTOCOCCUS  Culture, blood (x 2)     Status: None   Collection Time: 04/16/14 10:16 AM  Result Value Ref Range Status   Specimen Description BLOOD RIGHT ANTECUBITAL  Final   Special Requests BOTTLES DRAWN AEROBIC AND ANAEROBIC 10CC  Final   Culture   Final    VIRIDANS STREPTOCOCCUS Note: SUSCEPTIBILITIES PERFORMED ON PREVIOUS CULTURE WITHIN THE LAST 5 DAYS. Note: Gram Stain Report Called to,Read Back By and Verified With: MISTY MCDANIEL RN 04/17/14 0700 BY RESSEGGER R Performed at [facility] Performed at [facility]    Report Status 04/19/2014 FINAL  Final     Studies: No results found.  Scheduled Meds: . antiseptic oral rinse  7 mL Mouth Rinse QID  . aspirin  81 mg Per Tube Daily  . cefTRIAXone (ROCEPHIN)  IV  2 g Intravenous Q24H  . chlorhexidine  15 mL Mouth Rinse BID  . famotidine (PEPCID) IV  20 mg Intravenous Q24H  . feeding supplement (PRO-STAT SUGAR FREE 64)  30 mL Per Tube BID  . folic acid  1 mg Per Tube Daily  . insulin aspart  0-15 Units Subcutaneous 6 times per day  . insulin detemir  20 Units Subcutaneous Daily  . methylPREDNISolone (SOLU-MEDROL) injection  40 mg Intravenous Q12H  . multivitamin with minerals  1 tablet Per Tube Daily  . sodium chloride  10-40 mL Intracatheter Q12H  . sodium chloride  10-40 mL Intracatheter Q12H  . sodium chloride  3 mL Intravenous Q12H  . thiamine  100 mg Oral Daily   Or  . thiamine  100 mg Intravenous Daily   Continuous Infusions: . dexmedetomidine 0.9 mcg/kg/hr (04/21/14 0900)  . dextrose 5 % with KCl 20 mEq / L 20 mEq (04/21/14 0800)  . feeding supplement (VITAL AF 1.2 CAL) 1,000 mL (04/21/14 0800)  . heparin 900 Units/hr (04/21/14 0800)    Principal Problem:   Acute respiratory failure with hypoxemia Active Problems:   Diabetes type 2, controlled   Angioedema    Acute combined systolic and diastolic CHF, NYHA class 4   Delirium tremens   DVT (deep venous thrombosis)   Ventilator dependence   Severe sepsis    Time spent: 35 minutes. Greater than 50% of this time was spent in direct contact with the patient coordinating care.    Chaya JanHERNANDEZ ACOSTA,ESTELA  [facility] Pager [phone]  If 7PM-7AM, please contact night-coverage at [email or web address], password [email or web address] 04/21/2014, 10:24 AM  LOS: 8 days

## 2014-04-21 NOTE — Progress Notes (Signed)
Subjective: I think he's overall about the same. He remains intubated and on the ventilator.  Objective: Vital signs in last 24 hours: Temp:  [97.5 F (36.4 C)-100.5 F (38.1 C)] 97.5 F (36.4 C) (03/26 0500) Pulse Rate:  [45-106] 93 (03/26 0700) Resp:  [12-31] 12 (03/26 0700) BP: (92-147)/(56-93) 102/62 mmHg (03/26 0700) SpO2:  [96 %-100 %] 100 % (03/26 0700) FiO2 (%):  [40 %-100 %] 40 % (03/26 0439) Weight:  [128.1 kg (282 lb 6.6 oz)] 128.1 kg (282 lb 6.6 oz) (03/26 0500) Weight change: 0.8 kg (1 lb 12.2 oz) Last BM Date: 04/14/14  Intake/Output from previous day: 03/25 0701 - 03/26 0700 In: 4138.4 [I.V.:2361.4; Blood:677; NG/GT:1050; IV Piggyback:50] Out: 4850 [Urine:4850]  PHYSICAL EXAM General appearance: He is intubated and on the ventilator. He is somewhat responsive on Precedex. He has intermittent periods of being agitated. I think his tongue is a little less swollen. Resp: rhonchi bilaterally Cardio: His heart is regular to exam. He looks like he may be in atrial flutter on the monitor GI: soft, non-tender; bowel sounds normal; no masses,  no organomegaly Extremities: extremities normal, atraumatic, no cyanosis or edema  Lab Results:  Results for orders placed or performed during the hospital encounter of 04/13/14 (from the past 48 hour(s))  Glucose, capillary     Status: Abnormal   Collection Time: 04/19/14  7:40 AM  Result Value Ref Range   Glucose-Capillary 342 (H) 70 - 99 mg/dL   Comment 1 Notify RN   Glucose, capillary     Status: Abnormal   Collection Time: 04/19/14 11:15 AM  Result Value Ref Range   Glucose-Capillary 296 (H) 70 - 99 mg/dL   Comment 1 Notify RN   Heparin level (unfractionated)     Status: None   Collection Time: 04/19/14  4:04 PM  Result Value Ref Range   Heparin Unfractionated 0.34 0.30 - 0.70 IU/mL    Comment:        IF HEPARIN RESULTS ARE BELOW EXPECTED VALUES, AND PATIENT DOSAGE HAS BEEN CONFIRMED, SUGGEST FOLLOW UP TESTING OF  ANTITHROMBIN III LEVELS.   Glucose, capillary     Status: Abnormal   Collection Time: 04/19/14  4:16 PM  Result Value Ref Range   Glucose-Capillary 289 (H) 70 - 99 mg/dL   Comment 1 Notify RN   Glucose, capillary     Status: Abnormal   Collection Time: 04/19/14  7:49 PM  Result Value Ref Range   Glucose-Capillary 274 (H) 70 - 99 mg/dL   Comment 1 Notify RN   Glucose, capillary     Status: Abnormal   Collection Time: 04/20/14 12:30 AM  Result Value Ref Range   Glucose-Capillary 288 (H) 70 - 99 mg/dL   Comment 1 Notify RN   Glucose, capillary     Status: Abnormal   Collection Time: 04/20/14  4:24 AM  Result Value Ref Range   Glucose-Capillary 239 (H) 70 - 99 mg/dL   Comment 1 Notify RN   Heparin level (unfractionated)     Status: None   Collection Time: 04/20/14  4:28 AM  Result Value Ref Range   Heparin Unfractionated 0.58 0.30 - 0.70 IU/mL    Comment:        IF HEPARIN RESULTS ARE BELOW EXPECTED VALUES, AND PATIENT DOSAGE HAS BEEN CONFIRMED, SUGGEST FOLLOW UP TESTING OF ANTITHROMBIN III LEVELS.   Basic metabolic panel     Status: Abnormal   Collection Time: 04/20/14  4:28 AM  Result Value Ref Range  Sodium 148 (H) 135 - 145 mmol/L    Comment: DELTA CHECK NOTED   Potassium 3.2 (L) 3.5 - 5.1 mmol/L   Chloride 104 96 - 112 mmol/L   CO2 33 (H) 19 - 32 mmol/L   Glucose, Bld 232 (H) 70 - 99 mg/dL   BUN 85 (H) 6 - 23 mg/dL   Creatinine, Ser 2.17 (H) 0.50 - 1.35 mg/dL   Calcium 8.9 8.4 - 10.5 mg/dL   GFR calc non Af Amer 31 (L) >90 mL/min   GFR calc Af Amer 36 (L) >90 mL/min    Comment: (NOTE) The eGFR has been calculated using the CKD EPI equation. This calculation has not been validated in all clinical situations. eGFR's persistently <90 mL/min signify possible Chronic Kidney Disease.    Anion gap 11 5 - 15  CBC     Status: Abnormal   Collection Time: 04/20/14  4:28 AM  Result Value Ref Range   WBC 11.3 (H) 4.0 - 10.5 K/uL   RBC 3.75 (L) 4.22 - 5.81 MIL/uL    Hemoglobin 11.5 (L) 13.0 - 17.0 g/dL   HCT 35.5 (L) 39.0 - 52.0 %   MCV 94.7 78.0 - 100.0 fL   MCH 30.7 26.0 - 34.0 pg   MCHC 32.4 30.0 - 36.0 g/dL   RDW 13.4 11.5 - 15.5 %   Platelets 143 (L) 150 - 400 K/uL  Blood gas, arterial     Status: Abnormal   Collection Time: 04/20/14  4:45 AM  Result Value Ref Range   FIO2 40.00 %   Delivery systems VENTILATOR    Mode PRESSURE REGULATED VOLUME CONTROL    VT 700 mL   Rate 12.0 resp/min   Peep/cpap 5.0 cm H20   pH, Arterial 7.480 (H) 7.350 - 7.450   pCO2 arterial 38.6 35.0 - 45.0 mmHg   pO2, Arterial 115.0 (H) 80.0 - 100.0 mmHg   Bicarbonate 28.5 (H) 20.0 - 24.0 mEq/L   TCO2 24.2 0 - 100 mmol/L   Acid-Base Excess 4.9 (H) 0.0 - 2.0 mmol/L   O2 Saturation 98.2 %   Patient temperature 37.0    Collection site RIGHT RADIAL    Drawn by [id number]    Sample type ARTERIAL    Allens test (pass/fail) PASS PASS  Glucose, capillary     Status: Abnormal   Collection Time: 04/20/14  7:22 AM  Result Value Ref Range   Glucose-Capillary 139 (H) 70 - 99 mg/dL   Comment 1 Notify RN    Comment 2 Document in Chart   Prepare fresh frozen plasma     Status: None (Preliminary result)   Collection Time: 04/20/14 10:29 AM  Result Value Ref Range   Unit Number [id number]    Blood Component Type THAWED PLASMA    Unit division 00    Status of Unit ISSUED    Transfusion Status OK TO TRANSFUSE    Unit Number [id number]    Blood Component Type THAWED PLASMA    Unit division 00    Status of Unit ISSUED    Transfusion Status OK TO TRANSFUSE   ABO/Rh     Status: None   Collection Time: 04/20/14 10:32 AM  Result Value Ref Range   ABO/RH(D) O NEG   Glucose, capillary     Status: Abnormal   Collection Time: 04/20/14 11:10 AM  Result Value Ref Range   Glucose-Capillary 160 (H) 70 - 99 mg/dL   Comment 1 Notify RN    Comment 2 Document  in Chart   Glucose, capillary     Status: Abnormal   Collection Time: 04/20/14  4:05 PM  Result Value Ref Range    Glucose-Capillary 267 (H) 70 - 99 mg/dL  Glucose, capillary     Status: Abnormal   Collection Time: 04/20/14  8:30 PM  Result Value Ref Range   Glucose-Capillary 279 (H) 70 - 99 mg/dL   Comment 1 Notify RN   Glucose, capillary     Status: Abnormal   Collection Time: 04/21/14 12:21 AM  Result Value Ref Range   Glucose-Capillary 316 (H) 70 - 99 mg/dL   Comment 1 Notify RN   Heparin level (unfractionated)     Status: None   Collection Time: 04/21/14  4:35 AM  Result Value Ref Range   Heparin Unfractionated 0.63 0.30 - 0.70 IU/mL    Comment:        IF HEPARIN RESULTS ARE BELOW EXPECTED VALUES, AND PATIENT DOSAGE HAS BEEN CONFIRMED, SUGGEST FOLLOW UP TESTING OF ANTITHROMBIN III LEVELS.   Basic metabolic panel     Status: Abnormal   Collection Time: 04/21/14  4:35 AM  Result Value Ref Range   Sodium 147 (H) 135 - 145 mmol/L   Potassium 3.4 (L) 3.5 - 5.1 mmol/L   Chloride 103 96 - 112 mmol/L   CO2 33 (H) 19 - 32 mmol/L   Glucose, Bld 317 (H) 70 - 99 mg/dL   BUN 88 (H) 6 - 23 mg/dL   Creatinine, Ser 1.95 (H) 0.50 - 1.35 mg/dL   Calcium 8.8 8.4 - 10.5 mg/dL   GFR calc non Af Amer 35 (L) >90 mL/min   GFR calc Af Amer 41 (L) >90 mL/min    Comment: (NOTE) The eGFR has been calculated using the CKD EPI equation. This calculation has not been validated in all clinical situations. eGFR's persistently <90 mL/min signify possible Chronic Kidney Disease.    Anion gap 11 5 - 15  CBC     Status: Abnormal   Collection Time: 04/21/14  4:35 AM  Result Value Ref Range   WBC 10.8 (H) 4.0 - 10.5 K/uL   RBC 3.34 (L) 4.22 - 5.81 MIL/uL   Hemoglobin 10.6 (L) 13.0 - 17.0 g/dL   HCT 32.2 (L) 39.0 - 52.0 %   MCV 96.4 78.0 - 100.0 fL   MCH 31.7 26.0 - 34.0 pg   MCHC 32.9 30.0 - 36.0 g/dL   RDW 13.5 11.5 - 15.5 %   Platelets 135 (L) 150 - 400 K/uL  Glucose, capillary     Status: Abnormal   Collection Time: 04/21/14  4:47 AM  Result Value Ref Range   Glucose-Capillary 314 (H) 70 - 99 mg/dL    Comment 1 Notify RN   Blood gas, arterial     Status: Abnormal   Collection Time: 04/21/14  4:49 AM  Result Value Ref Range   FIO2 0.40 %   Delivery systems VENTILATOR    Mode PRESSURE REGULATED VOLUME CONTROL    VT 700 mL   Rate 12 resp/min   Peep/cpap 5.0 cm H20   pH, Arterial 7.523 (H) 7.350 - 7.450   pCO2 arterial 36.6 35.0 - 45.0 mmHg   pO2, Arterial 98.3 80.0 - 100.0 mmHg   Bicarbonate 29.9 (H) 20.0 - 24.0 mEq/L   TCO2 27.0 0 - 100 mmol/L   Acid-Base Excess 6.8 (H) 0.0 - 2.0 mmol/L   O2 Saturation 97.9 %   Patient temperature 37.0    Collection site RIGHT  RADIAL    Drawn by [phone]    Sample type ARTERIAL DRAW    Allens test (pass/fail) PASS PASS    ABGS  Recent Labs  04/21/14 0449  PHART 7.523*  PO2ART 98.3  TCO2 27.0  HCO3 29.9*   CULTURES Recent Results (from the past 240 hour(s))  MRSA PCR Screening     Status: Abnormal   Collection Time: 04/13/14  4:00 PM  Result Value Ref Range Status   MRSA by PCR POSITIVE (A) NEGATIVE Final    Comment:        The GeneXpert MRSA Assay (FDA approved for NASAL specimens only), is one component of a comprehensive MRSA colonization surveillance program. It is not intended to diagnose MRSA infection nor to guide or monitor treatment for MRSA infections. RESULT CALLED TO, READ BACK BY AND VERIFIED WITH:  CUMMINGS,R @ 2205 ON 04/13/14 BY WOODIE,J   Culture, blood (x 2)     Status: None   Collection Time: 04/16/14 10:06 AM  Result Value Ref Range Status   Specimen Description BLOOD PORTA CATH DRAWN BY RN  Final   Special Requests BOTTLES DRAWN AEROBIC AND ANAEROBIC 6CC  Final   Culture   Final    VIRIDANS STREPTOCOCCUS Note: Gram Stain Report Called to,Read Back By and Verified With: [place].R Performed at [facility] Performed at [facility]    Report Status 04/19/2014 FINAL  Final   Organism ID, Bacteria VIRIDANS STREPTOCOCCUS  Final      Susceptibility   Viridans  streptococcus - MIC (ETEST)*    PENICILLIN .064 SENSITIVE Sensitive     * VIRIDANS STREPTOCOCCUS  Culture, blood (x 2)     Status: None   Collection Time: 04/16/14 10:16 AM  Result Value Ref Range Status   Specimen Description BLOOD RIGHT ANTECUBITAL  Final   Special Requests BOTTLES DRAWN AEROBIC AND ANAEROBIC 10CC  Final   Culture   Final    VIRIDANS STREPTOCOCCUS Note: SUSCEPTIBILITIES PERFORMED ON PREVIOUS CULTURE WITHIN THE LAST 5 DAYS. Note: Gram Stain Report Called to,Read Back By and Verified With: MISTY MCDANIEL RN 04/17/14 0700 BY RESSEGGER R Performed at [facility] Performed at [facility]    Report Status 04/19/2014 FINAL  Final   Studies/Results: No results found.  Medications:  Prior to Admission:  Prescriptions prior to admission  Medication Sig Dispense Refill Last Dose  . insulin detemir (LEVEMIR) 100 UNIT/ML injection Inject 60 Units into the skin daily.     04/12/2014 at Unknown time  . metFORMIN (GLUCOPHAGE) 500 MG tablet Take 500 mg by mouth daily.     04/12/2014 at Unknown time  . PRESCRIPTION MEDICATION Blood pressure med awaiting va clairfication      Scheduled: . antiseptic oral rinse  7 mL Mouth Rinse QID  . aspirin  81 mg Per Tube Daily  . cefTRIAXone (ROCEPHIN)  IV  2 g Intravenous Q24H  . chlorhexidine  15 mL Mouth Rinse BID  . famotidine (PEPCID) IV  20 mg Intravenous Q24H  . feeding supplement (PRO-STAT SUGAR FREE 64)  30 mL Per Tube BID  . folic acid  1 mg Per Tube Daily  . insulin aspart  0-15 Units Subcutaneous 6 times per day  . insulin detemir  20 Units Subcutaneous Daily  . methylPREDNISolone (SOLU-MEDROL) injection  40 mg Intravenous Q12H  . multivitamin with minerals  1 tablet Per Tube Daily  . sodium chloride  10-40 mL Intracatheter Q12H  .  sodium chloride  10-40 mL Intracatheter Q12H  . sodium chloride  3 mL Intravenous Q12H  . thiamine  100 mg Oral Daily   Or  . thiamine  100 mg Intravenous Daily   Continuous: .  dexmedetomidine 0.9 mcg/kg/hr (04/21/14 0536)  . dextrose 5 % with KCl 20 mEq / L 20 mEq (04/21/14 0300)  . feeding supplement (VITAL AF 1.2 CAL) 1,000 mL (04/21/14 0300)  . heparin 900 Units/hr (04/21/14 0300)   TCN:[id number], diphenhydrAMINE, fentaNYL, fentaNYL, naphazoline, ondansetron **OR** ondansetron (ZOFRAN) IV, sodium chloride, sodium chloride  Assesment: He is overall about the same. I think he is improving. His tongue is still somewhat more swollen that I feel comfortable with going ahead and extubating him because he is a difficult intubation. I would like to see if he can breathe on his own some through the day. He apparently has episodes of agitation and I will add Ativan Principal Problem:   Acute respiratory failure with hypoxemia Active Problems:   Diabetes type 2, controlled   Angioedema   Acute combined systolic and diastolic CHF, NYHA class 4   Delirium tremens   DVT (deep venous thrombosis)   Ventilator dependence   Severe sepsis    Plan: Continue treatments. Potential extubation attempt tomorrow. I will give him 2 more units of fresh frozen plasma. Add Ativan    LOS: 8 days   Francia Verry L 04/21/2014, 7:15 AM

## 2014-04-21 NOTE — Progress Notes (Signed)
Gabriel Reynolds  MRN: [id number]  DOB/AGE: March 27, 1955 60 y.o.  Primary Care Physician:Pcp Not In System  Admit date: 04/13/2014  Chief Complaint:  Chief Complaint  Patient presents with  . Facial Swelling  . Leg Swelling    S-Pt presented on  04/13/2014 with  Chief Complaint  Patient presents with  . Facial Swelling  . Leg Swelling  .    Pt  Intubated, but now opening eyes.   Meds  . antiseptic oral rinse  7 mL Mouth Rinse QID  . aspirin  81 mg Per Tube Daily  . cefTRIAXone (ROCEPHIN)  IV  2 g Intravenous Q24H  . chlorhexidine  15 mL Mouth Rinse BID  . famotidine (PEPCID) IV  20 mg Intravenous Q24H  . feeding supplement (PRO-STAT SUGAR FREE 64)  30 mL Per Tube BID  . folic acid  1 mg Per Tube Daily  . insulin aspart  0-15 Units Subcutaneous 6 times per day  . insulin aspart  3 Units Subcutaneous Q4H  . [START ON 04/22/2014] insulin detemir  40 Units Subcutaneous Q lunch  . methylPREDNISolone (SOLU-MEDROL) injection  40 mg Intravenous Q12H  . multivitamin with minerals  1 tablet Per Tube Daily  . sodium chloride  10-40 mL Intracatheter Q12H  . sodium chloride  10-40 mL Intracatheter Q12H  . sodium chloride  3 mL Intravenous Q12H  . thiamine  100 mg Oral Daily   Or  . thiamine  100 mg Intravenous Daily      Physical Exam: Vital signs in last 24 hours: Temp:  [97.5 F (36.4 C)-100 F (37.8 C)] 98.4 F (36.9 C) (03/26 1030) Pulse Rate:  [45-106] 90 (03/26 1121) Resp:  [12-19] 12 (03/26 1121) BP: (82-147)/(56-91) 110/65 mmHg (03/26 1115) SpO2:  [96 %-100 %] 100 % (03/26 1121) FiO2 (%):  [40 %] 40 % (03/26 1121) Weight:  [282 lb 6.6 oz (128.1 kg)] 282 lb 6.6 oz (128.1 kg) (03/26 0500) Weight change: 1 lb 12.2 oz (0.8 kg) Last BM Date: 04/14/14  Intake/Output from previous day: 03/25 0701 - 03/26 0700 In: 4138.4 [I.V.:2361.4; Blood:677; NG/GT:1050; IV Piggyback:50] Out: 4850 [Urine:4850] Total I/O In: -  Out: 500 [Urine:500]   Physical Exam: General-  intubated, opens eyes, responds to commands Resp-ET tube in situ , Rhonchi + CVS- S1S2 regular in rate and tachycardiac GIT- BS+, soft, NT, ND EXT- 2+ LE Edema, No Cyanosis   Lab Results: CBC  Recent Labs  04/20/14 0428 04/21/14 0435  WBC 11.3* 10.8*  HGB 11.5* 10.6*  HCT 35.5* 32.2*  PLT 143* 135*    BMET  Recent Labs  04/20/14 0428 04/21/14 0435  NA 148* 147*  K 3.2* 3.4*  CL 104 103  CO2 33* 33*  GLUCOSE 232* 317*  BUN 85* 88*  CREATININE 2.17* 1.95*  CALCIUM 8.9 8.8   Creat trend 2016 1.98=> 4.25-4.26=>3.46=>2.17=>1.95 2009  1.4   MICRO Recent Results (from the past 240 hour(s))  MRSA PCR Screening     Status: Abnormal   Collection Time: 04/13/14  4:00 PM  Result Value Ref Range Status   MRSA by PCR POSITIVE (A) NEGATIVE Final    Comment:        The GeneXpert MRSA Assay (FDA approved for NASAL specimens only), is one component of a comprehensive MRSA colonization surveillance program. It is not intended to diagnose MRSA infection nor to guide or monitor treatment for MRSA infections. RESULT CALLED TO, READ BACK BY AND VERIFIED WITH:  CUMMINGS,R @ 2205 ON  04/13/14 BY WOODIE,J   Culture, blood (x 2)     Status: None   Collection Time: 04/16/14 10:06 AM  Result Value Ref Range Status   Specimen Description BLOOD PORTA CATH DRAWN BY RN  Final   Special Requests BOTTLES DRAWN AEROBIC AND ANAEROBIC 6CC  Final   Culture   Final    VIRIDANS STREPTOCOCCUS Note: Gram Stain Report Called to,Read Back By and Verified With: MCDANIEL M. AT 0700 BY RESSEGGER.R Performed at [facility] Performed at [facility]    Report Status 04/19/2014 FINAL  Final   Organism ID, Bacteria VIRIDANS STREPTOCOCCUS  Final      Susceptibility   Viridans streptococcus - MIC (ETEST)*    PENICILLIN .064 SENSITIVE Sensitive     * VIRIDANS STREPTOCOCCUS  Culture, blood (x 2)     Status: None   Collection Time: 04/16/14 10:16 AM  Result Value Ref Range Status    Specimen Description BLOOD RIGHT ANTECUBITAL  Final   Special Requests BOTTLES DRAWN AEROBIC AND ANAEROBIC 10CC  Final   Culture   Final    VIRIDANS STREPTOCOCCUS Note: SUSCEPTIBILITIES PERFORMED ON PREVIOUS CULTURE WITHIN THE LAST 5 DAYS. Note: Gram Stain Report Called to,Read Back By and Verified With: MISTY MCDANIEL RN 04/17/14 0700 BY RESSEGGER R Performed at [facility] Performed at [facility]    Report Status 04/19/2014 FINAL  Final      Lab Results  Component Value Date   CALCIUM 8.8 04/21/2014   CAION 1.21 11/13/2007        Impression: 1)Renal  AKI secondary to Prerenal/ hepatorenal/ATN/Cardiorenal/ACE               Pt urine output now much better               AKi improving               AKI on CKD ( Most likley )               CKD stage not sure as last creat was 1.4 from 2009.               CKD since 2009 ( most likley , not much data available)               CKD secondary to Cardiorenal               Progression of CKD now marked with AKI               2)CVS- hemodynamiclly stable  3)Anemia HGb at goal (9--11)   4)Resp- admitted with resp failure/pneumonia  now intubated On IV abx  5)CHF- Alcoholic Cardiomyopathy  Primary MD and Cardiology following  6)Electrolytes Hypokalemic Better  Hypernatremic Better  7)Acid base Co2 at goal   8) DVT- on Iv Heprain  9) ID- Blood Cultures positive  on ceftriaxone  10)CHF- admitted with CHF        Negative by 12 liters  Plan:  Will continue current care       Memori Sammon S 04/21/2014, 12:30 PM

## 2014-04-22 ENCOUNTER — Inpatient Hospital Stay (HOSPITAL_COMMUNITY): Payer: Medicare Other

## 2014-04-22 LAB — BASIC METABOLIC PANEL
ANION GAP: 13 (ref 5–15)
BUN: 89 mg/dL — ABNORMAL HIGH (ref 6–23)
CALCIUM: 9.2 mg/dL (ref 8.4–10.5)
CHLORIDE: 104 mmol/L (ref 96–112)
CO2: 31 mmol/L (ref 19–32)
Creatinine, Ser: 1.79 mg/dL — ABNORMAL HIGH (ref 0.50–1.35)
GFR calc Af Amer: 45 mL/min — ABNORMAL LOW (ref >90)
GFR calc non Af Amer: 39 mL/min — ABNORMAL LOW (ref >90)
Glucose, Bld: 294 mg/dL — ABNORMAL HIGH (ref 70–99)
POTASSIUM: 3.1 mmol/L — AB (ref 3.5–5.1)
SODIUM: 148 mmol/L — AB (ref 135–145)

## 2014-04-22 LAB — BLOOD GAS, ARTERIAL
Acid-Base Excess: 5.3 mmol/L — ABNORMAL HIGH (ref 0.0–2.0)
Bicarbonate: 29.2 mEq/L — ABNORMAL HIGH (ref 20.0–24.0)
DRAWN BY: 38235
FIO2: 0.35 %
O2 Saturation: 97.6 %
PH ART: 7.461 — AB (ref 7.350–7.450)
Patient temperature: 37
TCO2: 26.3 mmol/L (ref 0–100)
pCO2 arterial: 41.5 mmHg (ref 35.0–45.0)
pO2, Arterial: 100 mmHg (ref 80.0–100.0)

## 2014-04-22 LAB — CBC
HCT: 32.1 % — ABNORMAL LOW (ref 39.0–52.0)
HEMOGLOBIN: 10.3 g/dL — AB (ref 13.0–17.0)
MCH: 31.1 pg (ref 26.0–34.0)
MCHC: 32.1 g/dL (ref 30.0–36.0)
MCV: 97 fL (ref 78.0–100.0)
Platelets: 238 10*3/uL (ref 150–400)
RBC: 3.31 MIL/uL — ABNORMAL LOW (ref 4.22–5.81)
RDW: 13.5 % (ref 11.5–15.5)
WBC: 20.5 10*3/uL — AB (ref 4.0–10.5)

## 2014-04-22 LAB — PREPARE FRESH FROZEN PLASMA
UNIT DIVISION: 0
Unit division: 0

## 2014-04-22 LAB — HEPARIN LEVEL (UNFRACTIONATED): HEPARIN UNFRACTIONATED: 0.65 [IU]/mL (ref 0.30–0.70)

## 2014-04-22 LAB — MAGNESIUM: MAGNESIUM: 2 mg/dL (ref 1.5–2.5)

## 2014-04-22 MED ORDER — FUROSEMIDE 10 MG/ML IJ SOLN
40.0000 mg | Freq: Once | INTRAMUSCULAR | Status: AC
Start: 2014-04-22 — End: 2014-04-22
  Administered 2014-04-22: 40 mg via INTRAVENOUS
  Filled 2014-04-22: qty 4

## 2014-04-22 MED ORDER — METOPROLOL TARTRATE 1 MG/ML IV SOLN
5.0000 mg | INTRAVENOUS | Status: DC | PRN
  Administered 2014-04-22 (×3): 5 mg via INTRAVENOUS
  Filled 2014-04-22 (×3): qty 5

## 2014-04-22 MED ORDER — DIGOXIN 0.25 MG/ML IJ SOLN
0.1250 mg | Freq: Once | INTRAMUSCULAR | Status: AC
  Administered 2014-04-22: 0.125 mg via INTRAVENOUS
  Filled 2014-04-22: qty 2

## 2014-04-22 MED ORDER — METHYLPREDNISOLONE SODIUM SUCC 40 MG IJ SOLR
40.0000 mg | INTRAMUSCULAR | Status: DC
  Administered 2014-04-23 – 2014-04-24 (×2): 40 mg via INTRAVENOUS
  Filled 2014-04-22 (×2): qty 1

## 2014-04-22 MED ORDER — POTASSIUM CHLORIDE 10 MEQ/100ML IV SOLN
10.0000 meq | INTRAVENOUS | Status: AC
  Administered 2014-04-22 (×4): 10 meq via INTRAVENOUS
  Filled 2014-04-22: qty 100

## 2014-04-22 MED ORDER — INSULIN DETEMIR 100 UNIT/ML ~~LOC~~ SOLN
50.0000 [IU] | Freq: Every day | SUBCUTANEOUS | Status: DC
  Administered 2014-04-23: 50 [IU] via SUBCUTANEOUS
  Filled 2014-04-22 (×3): qty 0.5

## 2014-04-22 MED ORDER — METOPROLOL TARTRATE 1 MG/ML IV SOLN
2.5000 mg | Freq: Four times a day (QID) | INTRAVENOUS | Status: DC | PRN
  Administered 2014-04-22: 2.5 mg via INTRAVENOUS
  Filled 2014-04-22: qty 5

## 2014-04-22 NOTE — Progress Notes (Signed)
Subjective: He says he feels better. He extubated himself last night. He is tolerating this so far. He still has swelling of his tongue.  Objective: Vital signs in last 24 hours: Temp:  [97.7 F (36.5 C)-99.1 F (37.3 C)] 98.3 F (36.8 C) (03/27 0400) Pulse Rate:  [51-151] 61 (03/27 0600) Resp:  [11-29] 25 (03/27 0600) BP: (82-149)/(56-94) 125/69 mmHg (03/27 0600) SpO2:  [97 %-100 %] 100 % (03/27 0600) FiO2 (%):  [35 %-40 %] 35 % (03/26 2300) Weight:  [124.2 kg (273 lb 13 oz)] 124.2 kg (273 lb 13 oz) (03/27 0447) Weight change: -3.9 kg (-8 lb 9.6 oz) Last BM Date: 04/21/14  Intake/Output from previous day: 03/26 0701 - 03/27 0700 In: 2617 [I.V.:2016; Blood:601] Out: 900 [Urine:900]  PHYSICAL EXAM General appearance: alert, cooperative and no distress Resp: rhonchi bilaterally Cardio: He has significant tachycardia at about 1:30 now GI: soft, non-tender; bowel sounds normal; no masses,  no organomegaly Extremities: extremities normal, atraumatic, no cyanosis or edema  Lab Results:  Results for orders placed or performed during the hospital encounter of 04/13/14 (from the past 48 hour(s))  Prepare fresh frozen plasma     Status: None   Collection Time: 04/20/14 10:29 AM  Result Value Ref Range   Unit Number [id number]    Blood Component Type THAWED PLASMA    Unit division 00    Status of Unit ISSUED,FINAL    Transfusion Status OK TO TRANSFUSE    Unit Number [id number]    Blood Component Type THAWED PLASMA    Unit division 00    Status of Unit ISSUED,FINAL    Transfusion Status OK TO TRANSFUSE   ABO/Rh     Status: None   Collection Time: 04/20/14 10:32 AM  Result Value Ref Range   ABO/RH(D) O NEG   Glucose, capillary     Status: Abnormal   Collection Time: 04/20/14 11:10 AM  Result Value Ref Range   Glucose-Capillary 160 (H) 70 - 99 mg/dL   Comment 1 Notify RN    Comment 2 Document in Chart   Glucose, capillary     Status: Abnormal   Collection Time:  04/20/14  4:05 PM  Result Value Ref Range   Glucose-Capillary 267 (H) 70 - 99 mg/dL  Glucose, capillary     Status: Abnormal   Collection Time: 04/20/14  8:30 PM  Result Value Ref Range   Glucose-Capillary 279 (H) 70 - 99 mg/dL   Comment 1 Notify RN   Glucose, capillary     Status: Abnormal   Collection Time: 04/21/14 12:21 AM  Result Value Ref Range   Glucose-Capillary 316 (H) 70 - 99 mg/dL   Comment 1 Notify RN   Heparin level (unfractionated)     Status: None   Collection Time: 04/21/14  4:35 AM  Result Value Ref Range   Heparin Unfractionated 0.63 0.30 - 0.70 IU/mL    Comment:        IF HEPARIN RESULTS ARE BELOW EXPECTED VALUES, AND PATIENT DOSAGE HAS BEEN CONFIRMED, SUGGEST FOLLOW UP TESTING OF ANTITHROMBIN III LEVELS.   Basic metabolic panel     Status: Abnormal   Collection Time: 04/21/14  4:35 AM  Result Value Ref Range   Sodium 147 (H) 135 - 145 mmol/L   Potassium 3.4 (L) 3.5 - 5.1 mmol/L   Chloride 103 96 - 112 mmol/L   CO2 33 (H) 19 - 32 mmol/L   Glucose, Bld 317 (H) 70 - 99 mg/dL   BUN 88 (  H) 6 - 23 mg/dL   Creatinine, Ser 1.95 (H) 0.50 - 1.35 mg/dL   Calcium 8.8 8.4 - 10.5 mg/dL   GFR calc non Af Amer 35 (L) >90 mL/min   GFR calc Af Amer 41 (L) >90 mL/min    Comment: (NOTE) The eGFR has been calculated using the CKD EPI equation. This calculation has not been validated in all clinical situations. eGFR's persistently <90 mL/min signify possible Chronic Kidney Disease.    Anion gap 11 5 - 15  CBC     Status: Abnormal   Collection Time: 04/21/14  4:35 AM  Result Value Ref Range   WBC 10.8 (H) 4.0 - 10.5 K/uL   RBC 3.34 (L) 4.22 - 5.81 MIL/uL   Hemoglobin 10.6 (L) 13.0 - 17.0 g/dL   HCT 32.2 (L) 39.0 - 52.0 %   MCV 96.4 78.0 - 100.0 fL   MCH 31.7 26.0 - 34.0 pg   MCHC 32.9 30.0 - 36.0 g/dL   RDW 13.5 11.5 - 15.5 %   Platelets 135 (L) 150 - 400 K/uL  Glucose, capillary     Status: Abnormal   Collection Time: 04/21/14  4:47 AM  Result Value Ref Range    Glucose-Capillary 314 (H) 70 - 99 mg/dL   Comment 1 Notify RN   Blood gas, arterial     Status: Abnormal   Collection Time: 04/21/14  4:49 AM  Result Value Ref Range   FIO2 0.40 %   Delivery systems VENTILATOR    Mode PRESSURE REGULATED VOLUME CONTROL    VT 700 mL   Rate 12 resp/min   Peep/cpap 5.0 cm H20   pH, Arterial 7.523 (H) 7.350 - 7.450   pCO2 arterial 36.6 35.0 - 45.0 mmHg   pO2, Arterial 98.3 80.0 - 100.0 mmHg   Bicarbonate 29.9 (H) 20.0 - 24.0 mEq/L   TCO2 27.0 0 - 100 mmol/L   Acid-Base Excess 6.8 (H) 0.0 - 2.0 mmol/L   O2 Saturation 97.9 %   Patient temperature 37.0    Collection site RIGHT RADIAL    Drawn by [phone]    Sample type ARTERIAL DRAW    Allens test (pass/fail) PASS PASS  Prepare fresh frozen plasma     Status: None   Collection Time: 04/21/14  7:00 AM  Result Value Ref Range   Unit Number [id number]    Blood Component Type THAWED PLASMA    Unit division 00    Status of Unit ISSUED,FINAL    Transfusion Status OK TO TRANSFUSE    Unit Number [id number]    Blood Component Type THAWED PLASMA    Unit division 00    Status of Unit ISSUED,FINAL    Transfusion Status OK TO TRANSFUSE   Glucose, capillary     Status: Abnormal   Collection Time: 04/21/14  8:00 AM  Result Value Ref Range   Glucose-Capillary 329 (H) 70 - 99 mg/dL  Glucose, capillary     Status: Abnormal   Collection Time: 04/21/14 11:35 AM  Result Value Ref Range   Glucose-Capillary 364 (H) 70 - 99 mg/dL  Heparin level (unfractionated)     Status: None   Collection Time: 04/22/14  4:48 AM  Result Value Ref Range   Heparin Unfractionated 0.65 0.30 - 0.70 IU/mL    Comment:        IF HEPARIN RESULTS ARE BELOW EXPECTED VALUES, AND PATIENT DOSAGE HAS BEEN CONFIRMED, SUGGEST FOLLOW UP TESTING OF ANTITHROMBIN III LEVELS.   CBC  Status: Abnormal   Collection Time: 04/22/14  4:48 AM  Result Value Ref Range   WBC 20.5 (H) 4.0 - 10.5 K/uL   RBC 3.31 (L) 4.22 - 5.81 MIL/uL    Hemoglobin 10.3 (L) 13.0 - 17.0 g/dL   HCT 32.1 (L) 39.0 - 52.0 %   MCV 97.0 78.0 - 100.0 fL   MCH 31.1 26.0 - 34.0 pg   MCHC 32.1 30.0 - 36.0 g/dL   RDW 13.5 11.5 - 15.5 %   Platelets 238 150 - 400 K/uL    Comment: DELTA CHECK NOTED RESULT REPEATED AND VERIFIED   Basic metabolic panel     Status: Abnormal   Collection Time: 04/22/14  4:48 AM  Result Value Ref Range   Sodium 148 (H) 135 - 145 mmol/L   Potassium 3.1 (L) 3.5 - 5.1 mmol/L   Chloride 104 96 - 112 mmol/L   CO2 31 19 - 32 mmol/L   Glucose, Bld 294 (H) 70 - 99 mg/dL   BUN 89 (H) 6 - 23 mg/dL   Creatinine, Ser 1.79 (H) 0.50 - 1.35 mg/dL   Calcium 9.2 8.4 - 10.5 mg/dL   GFR calc non Af Amer 39 (L) >90 mL/min   GFR calc Af Amer 45 (L) >90 mL/min    Comment: (NOTE) The eGFR has been calculated using the CKD EPI equation. This calculation has not been validated in all clinical situations. eGFR's persistently <90 mL/min signify possible Chronic Kidney Disease.    Anion gap 13 5 - 15  Blood gas, arterial     Status: Abnormal   Collection Time: 04/22/14  5:02 AM  Result Value Ref Range   FIO2 0.35 %   Delivery systems VENTURI MASK    pH, Arterial 7.461 (H) 7.350 - 7.450   pCO2 arterial 41.5 35.0 - 45.0 mmHg   pO2, Arterial 100.0 80.0 - 100.0 mmHg   Bicarbonate 29.2 (H) 20.0 - 24.0 mEq/L   TCO2 26.3 0 - 100 mmol/L   Acid-Base Excess 5.3 (H) 0.0 - 2.0 mmol/L   O2 Saturation 97.6 %   Patient temperature 37.0    Collection site RIGHT RADIAL    Drawn by [phone]    Sample type ARTERIAL DRAW    Allens test (pass/fail) PASS PASS    ABGS  Recent Labs  04/22/14 0502  PHART 7.461*  PO2ART 100.0  TCO2 26.3  HCO3 29.2*   CULTURES Recent Results (from the past 240 hour(s))  MRSA PCR Screening     Status: Abnormal   Collection Time: 04/13/14  4:00 PM  Result Value Ref Range Status   MRSA by PCR POSITIVE (A) NEGATIVE Final    Comment:        The GeneXpert MRSA Assay (FDA approved for NASAL specimens only), is one  component of a comprehensive MRSA colonization surveillance program. It is not intended to diagnose MRSA infection nor to guide or monitor treatment for MRSA infections. RESULT CALLED TO, READ BACK BY AND VERIFIED WITH:  CUMMINGS,R @ 2205 ON 04/13/14 BY WOODIE,J   Culture, blood (x 2)     Status: None   Collection Time: 04/16/14 10:06 AM  Result Value Ref Range Status   Specimen Description BLOOD PORTA CATH DRAWN BY RN  Final   Special Requests BOTTLES DRAWN AEROBIC AND ANAEROBIC 6CC  Final   Culture   Final    VIRIDANS STREPTOCOCCUS Note: Gram Stain Report Called to,Read Back By and Verified With: [place].R Performed at  [facility] Performed at [employer] [facility]    Report Status 04/19/2014 FINAL  Final   Organism ID, Bacteria VIRIDANS STREPTOCOCCUS  Final      Susceptibility   Viridans streptococcus - MIC (ETEST)*    PENICILLIN .064 SENSITIVE Sensitive     * VIRIDANS STREPTOCOCCUS  Culture, blood (x 2)     Status: None   Collection Time: 04/16/14 10:16 AM  Result Value Ref Range Status   Specimen Description BLOOD RIGHT ANTECUBITAL  Final   Special Requests BOTTLES DRAWN AEROBIC AND ANAEROBIC 10CC  Final   Culture   Final    VIRIDANS STREPTOCOCCUS Note: SUSCEPTIBILITIES PERFORMED ON PREVIOUS CULTURE WITHIN THE LAST 5 DAYS. Note: Gram Stain Report Called to,Read Back By and Verified With: MISTY MCDANIEL RN 04/17/14 0700 BY RESSEGGER R Performed at [facility] Performed at [facility]    Report Status 04/19/2014 FINAL  Final   Studies/Results: No results found.  Medications:  Prior to Admission:  Prescriptions prior to admission  Medication Sig Dispense Refill Last Dose  . insulin detemir (LEVEMIR) 100 UNIT/ML injection Inject 60 Units into the skin daily.     04/12/2014 at Unknown time  . metFORMIN (GLUCOPHAGE) 500 MG tablet Take 500 mg by mouth daily.     04/12/2014 at Unknown time  . PRESCRIPTION MEDICATION Blood  pressure med awaiting va clairfication      Scheduled: . antiseptic oral rinse  7 mL Mouth Rinse QID  . aspirin  81 mg Per Tube Daily  . cefTRIAXone (ROCEPHIN)  IV  2 g Intravenous Q24H  . chlorhexidine  15 mL Mouth Rinse BID  . famotidine (PEPCID) IV  20 mg Intravenous Q24H  . feeding supplement (PRO-STAT SUGAR FREE 64)  30 mL Per Tube BID  . folic acid  1 mg Per Tube Daily  . insulin aspart  0-15 Units Subcutaneous 6 times per day  . insulin aspart  3 Units Subcutaneous Q4H  . insulin detemir  40 Units Subcutaneous Q lunch  . methylPREDNISolone (SOLU-MEDROL) injection  40 mg Intravenous Q12H  . multivitamin with minerals  1 tablet Per Tube Daily  . sodium chloride  10-40 mL Intracatheter Q12H  . sodium chloride  10-40 mL Intracatheter Q12H  . sodium chloride  3 mL Intravenous Q12H  . thiamine  100 mg Oral Daily   Or  . thiamine  100 mg Intravenous Daily   Continuous: . dexmedetomidine 0.9 mcg/kg/hr (04/21/14 1850)  . dextrose 5 % with KCl 20 mEq / L 20 mEq (04/22/14 0300)  . diltiazem (CARDIZEM) infusion 15 mg/hr (04/22/14 0441)  . feeding supplement (VITAL AF 1.2 CAL) 1,000 mL (04/21/14 1629)  . heparin 900 Units/hr (04/22/14 0300)   GUY:[id number], diphenhydrAMINE, fentaNYL, fentaNYL, LORazepam, metoprolol, naphazoline, ondansetron **OR** ondansetron (ZOFRAN) IV, sodium chloride, sodium chloride  Assesment: He has had acute respiratory failure which is a combination of acute combined systolic and diastolic heart failure and angioedema of the tongue presumably from Ace inhibitors. We had been treating his angioedema which was improving but he still had significant swelling so he had not been extubated. However he extubated himself and has tolerated that so far. Principal Problem:   Acute respiratory failure with hypoxemia Active Problems:   Diabetes type 2, controlled   Angioedema   Acute combined systolic and diastolic CHF, NYHA class 4   Delirium tremens   DVT (deep venous  thrombosis)   Ventilator dependence   Severe sepsis    Plan: Continue current  treatments    LOS: 9 days   Sherill Mangen L 04/22/2014, 8:40 AM

## 2014-04-22 NOTE — Progress Notes (Signed)
ANTICOAGULATION CONSULT NOTE  Pharmacy Consult for Heparin Indication: DVT  Allergies  Allergen Reactions  . Ace Inhibitors Swelling  . Other Hives    wool   Patient Measurements: Height: 6\' 1"  (185.4 cm) Weight: 273 lb 13 oz (124.2 kg) IBW/kg (Calculated) : 79.9 Heparin Dosing Weight: 111.4 kg  Vital Signs: Temp: 98.3 F (36.8 C) (03/27 0400) Temp Source: Axillary (03/27 0400) BP: 125/69 mmHg (03/27 0600) Pulse Rate: 61 (03/27 0600)  Labs:  Recent Labs  04/20/14 0428 04/21/14 0435 04/22/14 0448  HGB 11.5* 10.6* 10.3*  HCT 35.5* 32.2* 32.1*  PLT 143* 135* 238  HEPARINUNFRC 0.58 0.63 0.65  CREATININE 2.17* 1.95* 1.79*   Estimated Creatinine Clearance: 59.8 mL/min (by C-G formula based on Cr of 1.79).  Medical History: Past Medical History  Diagnosis Date  . Hypertension   . Diabetes mellitus    Medications:  Scheduled:  . antiseptic oral rinse  7 mL Mouth Rinse QID  . aspirin  81 mg Per Tube Daily  . cefTRIAXone (ROCEPHIN)  IV  2 g Intravenous Q24H  . chlorhexidine  15 mL Mouth Rinse BID  . famotidine (PEPCID) IV  20 mg Intravenous Q24H  . feeding supplement (PRO-STAT SUGAR FREE 64)  30 mL Per Tube BID  . folic acid  1 mg Per Tube Daily  . insulin aspart  0-15 Units Subcutaneous 6 times per day  . insulin aspart  3 Units Subcutaneous Q4H  . insulin detemir  40 Units Subcutaneous Q lunch  . methylPREDNISolone (SOLU-MEDROL) injection  40 mg Intravenous Q12H  . multivitamin with minerals  1 tablet Per Tube Daily  . sodium chloride  10-40 mL Intracatheter Q12H  . sodium chloride  10-40 mL Intracatheter Q12H  . sodium chloride  3 mL Intravenous Q12H  . thiamine  100 mg Oral Daily   Or  . thiamine  100 mg Intravenous Daily   Assessment: 59 yo obese M with + BLE DVT.  He is currently intubated on Precedex and has impaired renal & liver function.   Heparin has stabilized and Heparin level is at goal.     CBC has stabilized.    Goal of Therapy:  Heparin  level 0.3-0.7 units/ml  Monitor platelets by anticoagulation protocol: Yes   Plan:  Continue Heparin infusion at 900 units/hr Heparin level and CBC daily while on Heparin  Margo AyeHall, Tre Sanker A 04/22/2014,7:57 AM

## 2014-04-22 NOTE — Progress Notes (Signed)
Gabriel Reynolds  MRN: [id number]  DOB/AGE: [birth year] 59 y.o.  Primary Care Physician:Pcp Not In System  Admit date: 04/13/2014  Chief Complaint:  Chief Complaint  Patient presents with  . Facial Swelling  . Leg Swelling    S-Pt presented on  04/13/2014 with  Chief Complaint  Patient presents with  . Facial Swelling  . Leg Swelling  .    Pt  Extubated himself and now stable.   Meds  . antiseptic oral rinse  7 mL Mouth Rinse QID  . aspirin  81 mg Per Tube Daily  . cefTRIAXone (ROCEPHIN)  IV  2 g Intravenous Q24H  . chlorhexidine  15 mL Mouth Rinse BID  . famotidine (PEPCID) IV  20 mg Intravenous Q24H  . feeding supplement (PRO-STAT SUGAR FREE 64)  30 mL Per Tube BID  . folic acid  1 mg Per Tube Daily  . insulin aspart  0-15 Units Subcutaneous 6 times per day  . insulin aspart  3 Units Subcutaneous Q4H  . insulin detemir  40 Units Subcutaneous Q lunch  . methylPREDNISolone (SOLU-MEDROL) injection  40 mg Intravenous Q12H  . multivitamin with minerals  1 tablet Per Tube Daily  . sodium chloride  10-40 mL Intracatheter Q12H  . sodium chloride  10-40 mL Intracatheter Q12H  . sodium chloride  3 mL Intravenous Q12H  . thiamine  100 mg Oral Daily   Or  . thiamine  100 mg Intravenous Daily      Physical Exam: Vital signs in last 24 hours: Temp:  [97.7 F (36.5 C)-99.1 F (37.3 C)] 98.3 F (36.8 C) (03/27 0400) Pulse Rate:  [51-151] 61 (03/27 0600) Resp:  [11-29] 25 (03/27 0600) BP: (82-149)/(56-94) 125/69 mmHg (03/27 0600) SpO2:  [97 %-100 %] 100 % (03/27 0600) FiO2 (%):  [35 %-40 %] 35 % (03/26 2300) Weight:  [273 lb 13 oz (124.2 kg)] 273 lb 13 oz (124.2 kg) (03/27 0447) Weight change: -8 lb 9.6 oz (-3.9 kg) Last BM Date: 04/21/14  Intake/Output from previous day: 03/26 0701 - 03/27 0700 In: 2617 [I.V.:2016; Blood:601] Out: 900 [Urine:900]     Physical Exam: General- awake,alert,responds to commands Resp-NO acute rest distress, Rhonchi + CVS- S1S2 regular  in rate and tachycardiac GIT- BS+, soft, NT, ND EXT- 21 LE Edema, No Cyanosis   Lab Results: CBC  Recent Labs  04/21/14 0435 04/22/14 0448  WBC 10.8* 20.5*  HGB 10.6* 10.3*  HCT 32.2* 32.1*  PLT 135* 238    BMET  Recent Labs  04/21/14 0435 04/22/14 0448  NA 147* 148*  K 3.4* 3.1*  CL 103 104  CO2 33* 31  GLUCOSE 317* 294*  BUN 88* 89*  CREATININE 1.95* 1.79*  CALCIUM 8.8 9.2   Creat trend 2016 1.98=> 4.25-4.26=>3.46=>2.17=>1.95=>1.79 2009  1.4   MICRO Recent Results (from the past 240 hour(s))  MRSA PCR Screening     Status: Abnormal   Collection Time: 04/13/14  4:00 PM  Result Value Ref Range Status   MRSA by PCR POSITIVE (A) NEGATIVE Final    Comment:        The GeneXpert MRSA Assay (FDA approved for NASAL specimens only), is one component of a comprehensive MRSA colonization surveillance program. It is not intended to diagnose MRSA infection nor to guide or monitor treatment for MRSA infections. RESULT CALLED TO, READ BACK BY AND VERIFIED WITH:  CUMMINGS,R @ 2205 ON 04/13/14 BY WOODIE,J   Culture, blood (x 2)     Status: None  Collection Time: 04/16/14 10:06 AM  Result Value Ref Range Status   Specimen Description BLOOD PORTA CATH DRAWN BY RN  Final   Special Requests BOTTLES DRAWN AEROBIC AND ANAEROBIC 6CC  Final   Culture   Final    VIRIDANS STREPTOCOCCUS Note: Gram Stain Report Called to,Read Back By and Verified With: MCDANIEL M. AT 0700 BY [email or web address] Performed at [facility] Performed at [facility]    Report Status 04/19/2014 FINAL  Final   Organism ID, Bacteria VIRIDANS STREPTOCOCCUS  Final      Susceptibility   Viridans streptococcus - MIC (ETEST)*    PENICILLIN .064 SENSITIVE Sensitive     * VIRIDANS STREPTOCOCCUS  Culture, blood (x 2)     Status: None   Collection Time: 04/16/14 10:16 AM  Result Value Ref Range Status   Specimen Description BLOOD RIGHT ANTECUBITAL  Final   Special Requests BOTTLES DRAWN  AEROBIC AND ANAEROBIC 10CC  Final   Culture   Final    VIRIDANS STREPTOCOCCUS Note: SUSCEPTIBILITIES PERFORMED ON PREVIOUS CULTURE WITHIN THE LAST 5 DAYS. Note: Gram Stain Report Called to,Read Back By and Verified With: MISTY MCDANIEL RN 04/17/14 0700 BY RESSEGGER R Performed at [facility] Performed at [facility]    Report Status 04/19/2014 FINAL  Final      Lab Results  Component Value Date   CALCIUM 9.2 04/22/2014   CAION 1.21 11/13/2007        Impression: 1)Renal  AKI secondary to Prerenal/ hepatorenal/ATN/Cardiorenal/ACE               Pt urine output now much better               AKi improving               AKI on CKD ( Most likley )               CKD stage not sure as last creat was 1.4 from 2009.               CKD since 2009 ( most likley , not much data available)               CKD secondary to Cardiorenal               Progression of CKD now marked with AKI               2)CVS- hemodynamiclly stable  3)Anemia HGb at goal (9--11)   4)Resp- admitted with resp failure/pneumonia  now intubated On IV abx  5)CHF- Alcoholic Cardiomyopathy  Primary MD and Cardiology following  6)Electrolytes Hypokalemic Will replte  Hypernatremic stable  7)Acid base Co2 at goal   8) DVT- on Iv Heprain  9) ID- Blood Cultures positive  on ceftriaxone  10)CHF- admitted with CHF        Negative by 12 liters  Plan:  Will continue current care       Modelle Vollmer S 04/22/2014, 7:38 AM

## 2014-04-22 NOTE — Progress Notes (Addendum)
[facility] PROGRESS NOTE  Gabriel Reynolds GNF:[id number] DOB: 06-Dec-1955 DOA: 04/13/2014 PCP: Pcp Not In System  Assessment/Plan: Ventilatory dependent hypoxemic respiratory failure -Etiology likely a combination of decompensated CHF, angioedema and possibly DTs.  -Self extubated 3/24 requiring re-intubation. -Self-extubated again 3/26. -Doing well at present.  Acute combined systolic CHF/Biventricular Failure -Was able to wean off  dobutamine drip 3/24. -Renal has DC'd lasix drip due to hypernatremia and has started D5. -Appreciate cardiology following. -Is 10 L negative. -Consider starting lasix once Na has improved. -Will give 40 mg lasix today. (increasing LE edema),  Tachyarrhythmia -Appears to be a flutter on telemetry review. -Remains on cardizem drip for rate control. -Metoprolol IV ordered as well. -Start PO meds once swallow eval obtained.  Sepsis with bacteremia -2 out of 2 cultures with strep viridans. -Will narrow antibiotics to rocephin 3/24. -Possible source appears to be pneumonia giving left base consolidation on chest x-ray.  Hospital-acquired pneumonia/aspiration pneumonia -See above for details.  Angioedema -Tongue swelling much improved. -Continue steroids/Benadryl. -Presumed a late hypersensitivity reaction to ACE-I. -FFP to be given 3/25 and 3/26 for angioedema.  Alcohol abuse -DTs may have played a role in his acute respiratory decompensation over the weekend. -Continue thiamine/folate.  Bilateral DVTs  -Continue heparin drip. -Unable to confirm PE at this point given patient's unstable illness and renal dysfunction, but suspect this may be playing a role in his RV failure. -Transition to oral anticoagulants once ST eval complete and OK to start POs.  Acute renal failure -Improving  good urine output. -Have no records to determine chronicity of renal failure. -Cr down to 1.79 3/26.  Hypernatremia -Stable. -renal has stopped  diuretics and started on a D5 drip. -Need to be careful with IVF given biventricular heart failure.  DM II -CBGs increasing. -Increase levemir to 50 units. -Add tube feeding coverage 3 units.   Code Status: Full code Family Communication: Wife Gabriel Reynolds at bedside updated on plan of care  Disposition Plan: Keep in ICU today   Consultants:  Cardiology  Pulmonary   Antibiotics:  Rocephin  Subjective: Somewhat alert on ventilator.  Objective: Filed Vitals:   04/22/14 0800 04/22/14 0900 04/22/14 1000 04/22/14 1100  BP: 110/65 106/62 104/58 109/65  Pulse: 129 94 97 111  Temp:      TempSrc:      Resp: Height:      Weight:      SpO2: 98% 100% 99% 100%    Intake/Output Summary (Last 24 hours) at 04/22/14 1250 Last data filed at 04/22/14 1000  Gross per 24 hour  Intake   2400 ml  Output    925 ml  Net   1475 ml   Filed Weights   04/20/14 0500 04/21/14 0500 04/22/14 0447  Weight: 127.3 kg (280 lb 10.3 oz) 128.1 kg (282 lb 6.6 oz) 124.2 kg (273 lb 13 oz)    Exam:   General:  awake  Cardiovascular: Regular rate and rhythm  Respiratory: Decreased left base sounds, no wheezes  Abdomen: Soft, nontender, nondistended, positive bowel sounds  Extremities: 3+++ pitting edema on right, 1+ on left  Neurologic:  Sedated on vent  Data Reviewed: Basic Metabolic Panel:  Recent Labs Lab 04/16/14 0900 04/17/14 0426 04/18/14 0450 04/20/14 0428 04/21/14 0435 04/22/14 0448 04/22/14 0859  NA 133* 134* 133* 148* 147* 148*  --   K 5.0 4.1 4.1 3.2* 3.4* 3.1*  --   CL 99 99 96 104 103 104  --  CO2 33* 33* 31  --   GLUCOSE 154* 109* 271* 232* 317* 294*  --   BUN 65* 77* 84* 85* 88* 89*  --   CREATININE 4.26* 4.25* 3.46* 2.17* 1.95* 1.79*  --   CALCIUM 8.9 9.0 8.7 8.9 8.8 9.2  --   MG 1.7 2.1  --   --   --   --  2.0   Liver Function Tests:  Recent Labs Lab 04/16/14 0900  AST 97*  ALT 54*  ALKPHOS 81  BILITOT 1.8*  PROT 6.4  ALBUMIN  3.1*    Recent Labs Lab 04/15/14 2000  LIPASE 25   No results for input(s): AMMONIA in the last 168 hours. CBC:  Recent Labs Lab 04/16/14 1006  04/18/14 0032 04/18/14 0450 04/20/14 0428 04/21/14 0435 04/22/14 0448  WBC DUPLICATE  < > 7.7 8.6 11.3* 10.8* 20.5*  NEUTROABS 6.4  --  6.4  --   --   --   --   HGB DUPLICATE  < > 13.0 12.7* 11.5* 10.6* 10.3*  HCT DUPLICATE  < > 37.5* 36.5* 35.5* 32.2* 32.1*  MCV DUPLICATE  < > 91.9 92.2 94.7 96.4 97.0  PLT DUPLICATE  < > 125* 139* 143* 135* 238  < > = values in this interval not displayed. Cardiac Enzymes: No results for input(s): CKTOTAL, CKMB, CKMBINDEX, TROPONINI in the last 168 hours. BNP (last 3 results)  Recent Labs  04/13/14 1110  BNP 617.0*    ProBNP (last 3 results) No results for input(s): PROBNP in the last 8760 hours.  CBG:  Recent Labs Lab 04/20/14 2030 04/21/14 0021 04/21/14 0447 04/21/14 0800 04/21/14 1135  GLUCAP 279* 316* 314* 329* 364*    Recent Results (from the past 240 hour(s))  MRSA PCR Screening     Status: Abnormal   Collection Time: 04/13/14  4:00 PM  Result Value Ref Range Status   MRSA by PCR POSITIVE (A) NEGATIVE Final    Comment:        The GeneXpert MRSA Assay (FDA approved for NASAL specimens only), is one component of a comprehensive MRSA colonization surveillance program. It is not intended to diagnose MRSA infection nor to guide or monitor treatment for MRSA infections. RESULT CALLED TO, READ BACK BY AND VERIFIED WITH:  CUMMINGS,R @ 2205 ON 04/13/14 BY WOODIE,J   Culture, blood (x 2)     Status: None   Collection Time: 04/16/14 10:06 AM  Result Value Ref Range Status   Specimen Description BLOOD PORTA CATH DRAWN BY RN  Final   Special Requests BOTTLES DRAWN AEROBIC AND ANAEROBIC 6CC  Final   Culture   Final    VIRIDANS STREPTOCOCCUS Note: Gram Stain Report Called to,Read Back By and Verified With: MCDANIEL M. AT 0700 BY [email or web address] Performed at [facility] Performed at [facility]    Report Status 04/19/2014 FINAL  Final   Organism ID, Bacteria VIRIDANS STREPTOCOCCUS  Final      Susceptibility   Viridans streptococcus - MIC (ETEST)*    PENICILLIN .064 SENSITIVE Sensitive     * VIRIDANS STREPTOCOCCUS  Culture, blood (x 2)     Status: None   Collection Time: 04/16/14 10:16 AM  Result Value Ref Range Status   Specimen Description BLOOD RIGHT ANTECUBITAL  Final   Special Requests BOTTLES DRAWN AEROBIC AND ANAEROBIC 10CC  Final   Culture   Final    VIRIDANS STREPTOCOCCUS Note: SUSCEPTIBILITIES PERFORMED ON PREVIOUS CULTURE WITHIN THE LAST  5 DAYS. Note: Gram Stain Report Called to,Read Back By and Verified With: MISTY MCDANIEL RN 04/17/14 0700 BY RESSEGGER R Performed at [facility] Performed at [facility]    Report Status 04/19/2014 FINAL  Final     Studies: Dg Chest Port 1 View  04/22/2014   CLINICAL DATA:  Shortness of breath, extubated  EXAM: PORTABLE CHEST - 1 VIEW  COMPARISON:  04/19/2014  FINDINGS: Endotracheal tube has been removed. Mild enlargement of the cardiac silhouette is noted without evidence for edema. Trace bilateral pleural effusions are noted with persistent retrocardiac opacity. Right lung is grossly clear.  IMPRESSION: Persistent hypoaeration with trace bilateral pleural effusions and retrocardiac opacity.   Electronically Signed   By: Christiana PellantGretchen  Green M.D.   On: 04/22/2014 09:21    Scheduled Meds: . antiseptic oral rinse  7 mL Mouth Rinse QID  . aspirin  81 mg Per Tube Daily  . cefTRIAXone (ROCEPHIN)  IV  2 g Intravenous Q24H  . chlorhexidine  15 mL Mouth Rinse BID  . famotidine (PEPCID) IV  20 mg Intravenous Q24H  . feeding supplement (PRO-STAT SUGAR FREE 64)  30 mL Per Tube BID  . folic acid  1 mg Per Tube Daily  . insulin aspart  0-15 Units Subcutaneous 6 times per day  . insulin aspart  3 Units Subcutaneous Q4H  . insulin detemir  40 Units Subcutaneous Q lunch  .  methylPREDNISolone (SOLU-MEDROL) injection  40 mg Intravenous Q12H  . multivitamin with minerals  1 tablet Per Tube Daily  . potassium chloride  10 mEq Intravenous Q1 Hr x 4  . sodium chloride  10-40 mL Intracatheter Q12H  . sodium chloride  10-40 mL Intracatheter Q12H  . sodium chloride  3 mL Intravenous Q12H  . thiamine  100 mg Oral Daily   Or  . thiamine  100 mg Intravenous Daily   Continuous Infusions: . dextrose 5 % with KCl 20 mEq / L 20 mEq (04/22/14 1100)  . diltiazem (CARDIZEM) infusion 15 mg/hr (04/22/14 1208)  . feeding supplement (VITAL AF 1.2 CAL) 1,000 mL (04/21/14 1629)  . heparin 900 Units/hr (04/22/14 0300)    Principal Problem:   Acute respiratory failure with hypoxemia Active Problems:   Diabetes type 2, controlled   Angioedema   Acute combined systolic and diastolic CHF, NYHA class 4   Delirium tremens   DVT (deep venous thrombosis)   Ventilator dependence   Severe sepsis    Time spent: 35 minutes. Greater than 50% of this time was spent in direct contact with the patient coordinating care.    Chaya JanHERNANDEZ ACOSTA,ESTELA  [facility] Pager [phone]  If 7PM-7AM, please contact night-coverage at [email or web address], password [email or web address] 04/22/2014, 12:50 PM  LOS: 9 days

## 2014-04-23 LAB — GLUCOSE, CAPILLARY
GLUCOSE-CAPILLARY: 291 mg/dL — AB (ref 70–99)
GLUCOSE-CAPILLARY: 294 mg/dL — AB (ref 70–99)
GLUCOSE-CAPILLARY: 319 mg/dL — AB (ref 70–99)
Glucose-Capillary: 219 mg/dL — ABNORMAL HIGH (ref 70–99)
Glucose-Capillary: 270 mg/dL — ABNORMAL HIGH (ref 70–99)
Glucose-Capillary: 274 mg/dL — ABNORMAL HIGH (ref 70–99)
Glucose-Capillary: 302 mg/dL — ABNORMAL HIGH (ref 70–99)
Glucose-Capillary: 309 mg/dL — ABNORMAL HIGH (ref 70–99)
Glucose-Capillary: 337 mg/dL — ABNORMAL HIGH (ref 70–99)
Glucose-Capillary: 346 mg/dL — ABNORMAL HIGH (ref 70–99)
Glucose-Capillary: 339 mg/dL — ABNORMAL HIGH (ref 70–99)
Glucose-Capillary: 383 mg/dL — ABNORMAL HIGH (ref 70–99)

## 2014-04-23 LAB — BASIC METABOLIC PANEL
Anion gap: 11 (ref 5–15)
BUN: 89 mg/dL — ABNORMAL HIGH (ref 6–23)
CALCIUM: 9 mg/dL (ref 8.4–10.5)
CHLORIDE: 102 mmol/L (ref 96–112)
CO2: 30 mmol/L (ref 19–32)
Creatinine, Ser: 1.77 mg/dL — ABNORMAL HIGH (ref 0.50–1.35)
GFR calc Af Amer: 46 mL/min — ABNORMAL LOW (ref >90)
GFR calc non Af Amer: 40 mL/min — ABNORMAL LOW (ref >90)
Glucose, Bld: 350 mg/dL — ABNORMAL HIGH (ref 70–99)
Potassium: 3.8 mmol/L (ref 3.5–5.1)
SODIUM: 143 mmol/L (ref 135–145)

## 2014-04-23 LAB — HEPATIC FUNCTION PANEL
ALK PHOS: 66 U/L (ref 39–117)
ALT: 37 U/L (ref 0–53)
AST: 32 U/L (ref 0–37)
Albumin: 3.1 g/dL — ABNORMAL LOW (ref 3.5–5.2)
BILIRUBIN INDIRECT: 1 mg/dL — AB (ref 0.3–0.9)
Bilirubin, Direct: 0.8 mg/dL — ABNORMAL HIGH (ref 0.0–0.5)
Total Bilirubin: 1.8 mg/dL — ABNORMAL HIGH (ref 0.3–1.2)
Total Protein: 6.4 g/dL (ref 6.0–8.3)

## 2014-04-23 LAB — CBC
HCT: 28.3 % — ABNORMAL LOW (ref 39.0–52.0)
HEMOGLOBIN: 9.3 g/dL — AB (ref 13.0–17.0)
MCH: 31.7 pg (ref 26.0–34.0)
MCHC: 32.9 g/dL (ref 30.0–36.0)
MCV: 96.6 fL (ref 78.0–100.0)
PLATELETS: 219 10*3/uL (ref 150–400)
RBC: 2.93 MIL/uL — ABNORMAL LOW (ref 4.22–5.81)
RDW: 13.7 % (ref 11.5–15.5)
WBC: 18.7 10*3/uL — ABNORMAL HIGH (ref 4.0–10.5)

## 2014-04-23 LAB — HEPARIN LEVEL (UNFRACTIONATED): Heparin Unfractionated: 0.48 IU/mL (ref 0.30–0.70)

## 2014-04-23 MED ORDER — AMIODARONE HCL IN DEXTROSE 360-4.14 MG/200ML-% IV SOLN: 30.0000 mg/h | INTRAVENOUS | Status: DC

## 2014-04-23 MED ORDER — AMIODARONE HCL IN DEXTROSE 360-4.14 MG/200ML-% IV SOLN: 60.0000 mg/h | INTRAVENOUS | Status: DC

## 2014-04-23 MED ORDER — METOPROLOL TARTRATE 25 MG PO TABS
25.0000 mg | ORAL_TABLET | Freq: Four times a day (QID) | ORAL | Status: DC
  Administered 2014-04-23 – 2014-04-25 (×9): 25 mg via ORAL
  Filled 2014-04-23 (×9): qty 1

## 2014-04-23 MED ORDER — PRO-STAT SUGAR FREE PO LIQD
30.0000 mL | Freq: Two times a day (BID) | ORAL | Status: DC
  Administered 2014-04-23 – 2014-04-30 (×6): 30 mL via ORAL
  Filled 2014-04-23 (×11): qty 30

## 2014-04-23 MED ORDER — GLUCERNA SHAKE PO LIQD
237.0000 mL | Freq: Three times a day (TID) | ORAL | Status: DC
  Administered 2014-04-23 – 2014-04-30 (×13): 237 mL via ORAL

## 2014-04-23 MED ORDER — AMIODARONE LOAD VIA INFUSION
150.0000 mg | Freq: Once | INTRAVENOUS | Status: DC
  Filled 2014-04-23: qty 83.34

## 2014-04-23 MED ORDER — INSULIN DETEMIR 100 UNIT/ML ~~LOC~~ SOLN
60.0000 [IU] | Freq: Every day | SUBCUTANEOUS | Status: DC
  Filled 2014-04-23: qty 0.6

## 2014-04-23 MED ORDER — FUROSEMIDE 10 MG/ML IJ SOLN
40.0000 mg | Freq: Two times a day (BID) | INTRAMUSCULAR | Status: DC
  Administered 2014-04-23 – 2014-04-24 (×2): 40 mg via INTRAVENOUS
  Filled 2014-04-23 (×2): qty 4

## 2014-04-23 NOTE — Progress Notes (Signed)
ANTICOAGULATION CONSULT NOTE  Pharmacy Consult for Heparin Indication: DVT  Allergies  Allergen Reactions  . Ace Inhibitors Swelling  . Other Hives    wool   Patient Measurements: Height: 6\' 1"  (185.4 cm) Weight: 275 lb 12.7 oz (125.1 kg) IBW/kg (Calculated) : 79.9 Heparin Dosing Weight: 111.4 kg  Vital Signs: Temp: 97.6 F (36.4 C) (03/28 0800) Temp Source: Oral (03/28 0800) BP: 115/69 mmHg (03/28 1045) Pulse Rate: 110 (03/28 1045)  Labs:  Recent Labs  04/21/14 0435 04/22/14 0448 04/23/14 0536 04/23/14 0541  HGB 10.6* 10.3* 9.3*  --   HCT 32.2* 32.1* 28.3*  --   PLT 135* 238 219  --   HEPARINUNFRC 0.63 0.65  --  0.48  CREATININE 1.95* 1.79* 1.77*  --    Estimated Creatinine Clearance: 60.8 mL/min (by C-G formula based on Cr of 1.77).  Medical History: Past Medical History  Diagnosis Date  . Hypertension   . Diabetes mellitus    Medications:  Scheduled:  . antiseptic oral rinse  7 mL Mouth Rinse QID  . aspirin  81 mg Per Tube Daily  . cefTRIAXone (ROCEPHIN)  IV  2 g Intravenous Q24H  . chlorhexidine  15 mL Mouth Rinse BID  . famotidine (PEPCID) IV  20 mg Intravenous Q24H  . feeding supplement (PRO-STAT SUGAR FREE 64)  30 mL Per Tube BID  . folic acid  1 mg Per Tube Daily  . insulin aspart  0-15 Units Subcutaneous 6 times per day  . insulin aspart  3 Units Subcutaneous Q4H  . insulin detemir  50 Units Subcutaneous Q lunch  . methylPREDNISolone (SOLU-MEDROL) injection  40 mg Intravenous Q24H  . metoprolol tartrate  25 mg Oral Q6H  . multivitamin with minerals  1 tablet Per Tube Daily  . sodium chloride  10-40 mL Intracatheter Q12H  . sodium chloride  10-40 mL Intracatheter Q12H  . sodium chloride  3 mL Intravenous Q12H  . thiamine  100 mg Oral Daily   Or  . thiamine  100 mg Intravenous Daily   Assessment: 59 yo obese M with + BLE DVT.  He remains in ICU and has impaired renal & liver function. Pt self extubated and is doing OK at this time.  Heparin  has stabilized and Heparin level is at goal.    CBC is OK.  Goal of Therapy:  Heparin level 0.3-0.7 units/ml  Monitor platelets by anticoagulation protocol: Yes   Plan:  Continue Heparin infusion at 900 units/hr Heparin level and CBC daily while on Heparin F/U orders to begin oral anticoagulation when appropriate.  Margo AyeHall, Tryton Bodi A 04/23/2014,10:57 AM

## 2014-04-23 NOTE — Care Management Utilization Note (Signed)
UR completed 

## 2014-04-23 NOTE — Progress Notes (Signed)
Nutrition Follow -up  INTERVENTION: -D/c Vital 1.2 @ 50 ml/hr.   Add Glucerna Shake po TID due to poor meal intake (<25%). Each supplement provides 220 kcal and 10 grams of protein.   Provide ProStat 30 ml TID (each 30 ml provides 100 kcal, 15 gr protein)   NUTRITION DIAGNOSIS: Inadequate oral intake related to inability to eat as evidenced by <25% of meals consumed; ongoing  Goal: Pt will successfully transition to meet nutrition needs via oral intake once he is weaned; not met.  Monitor:   -percent meal and supplements consumed  59 y.o. male  ASSESSMENT: Patient Pt extubated and diet advanced. His family is assisting him with meals but his appetite is very poor-only bites of breakfast and lunch consumed. He is agreeable to take oral nutrition supplements pending the improvement of his appetite.  His estimated needs were reassessed since he's off the vent now.  His glucose is elevated and diabetes team is following. Abnormal labs: BUN trending up, Cr. 1.48 (improving), sodium  148 elevated, glucose elevated at 294 and Mag -WNL.  Height: Ht Readings from Last 1 Encounters:  04/21/14 $RemoveB'6\' 1"'MNILezdl$  (1.854 m)    Weight: Wt Readings from Last 1 Encounters:  04/23/14 275 lb 12.7 oz (125.1 kg)    Ideal Body Weight: 184# (83.6 kg)  % Ideal Body Weight: 167%  Wt Readings from Last 10 Encounters:  04/23/14 275 lb 12.7 oz (125.1 kg)  12/02/10 280 lb (127.007 kg)    Usual Body Weight: 280-290#  % Usual Body Weight:  105%  BMI:  Body mass index is 36.39 kg/(m^2). (skewed due to edema)  Estimated Nutritional Needs: Kcal: 0998-3382 Protein: 125 gr Fluid: 1.5 liters daily   Skin: intact  Diet Order: DIET DYS 3 Room service appropriate?: Yes; Fluid consistency:: Thin  EDUCATION NEEDS: -No education needs identified at this time   Intake/Output Summary (Last 24 hours) at 04/23/14 1507 Last data filed at 04/23/14 0800  Gross per 24 hour  Intake   1848 ml  Output   1300 ml   Net    548 ml    Last BM: 3/28  Labs:   Recent Labs Lab 04/17/14 0426  04/21/14 0435 04/22/14 0448 04/22/14 0859 04/23/14 0536  NA 134*  < > 147* 148*  --  143  K 4.1  < > 3.4* 3.1*  --  3.8  CL 99  < > 103 104  --  102  CO2 20  < > 33* 31  --  30  BUN 77*  < > 88* 89*  --  89*  CREATININE 4.25*  < > 1.95* 1.79*  --  1.77*  CALCIUM 9.0  < > 8.8 9.2  --  9.0  MG 2.1  --   --   --  2.0  --   GLUCOSE 109*  < > 317* 294*  --  350*  < > = values in this interval not displayed.  CBG (last 3)   Recent Labs  04/23/14 0407 04/23/14 0734 04/23/14 1112  GLUCAP 346* 339* 383*    Scheduled Meds: . antiseptic oral rinse  7 mL Mouth Rinse QID  . aspirin  81 mg Per Tube Daily  . cefTRIAXone (ROCEPHIN)  IV  2 g Intravenous Q24H  . chlorhexidine  15 mL Mouth Rinse BID  . famotidine (PEPCID) IV  20 mg Intravenous Q24H  . feeding supplement (GLUCERNA SHAKE)  237 mL Oral TID BM  . feeding supplement (PRO-STAT SUGAR FREE 64)  30 mL Oral BID  . folic acid  1 mg Per Tube Daily  . insulin aspart  0-15 Units Subcutaneous 6 times per day  . insulin aspart  3 Units Subcutaneous Q4H  . insulin detemir  50 Units Subcutaneous Q lunch  . methylPREDNISolone (SOLU-MEDROL) injection  40 mg Intravenous Q24H  . metoprolol tartrate  25 mg Oral Q6H  . multivitamin with minerals  1 tablet Per Tube Daily  . sodium chloride  10-40 mL Intracatheter Q12H  . sodium chloride  10-40 mL Intracatheter Q12H  . sodium chloride  3 mL Intravenous Q12H  . thiamine  100 mg Oral Daily   Or  . thiamine  100 mg Intravenous Daily    Continuous Infusions: . dextrose 5 % with KCl 20 mEq / L 20 mEq (04/23/14 0100)  . diltiazem (CARDIZEM) infusion Stopped (04/23/14 1457)  . heparin 900 Units/hr (04/23/14 0100)    Past Medical History  Diagnosis Date  . Hypertension   . Diabetes mellitus     Past Surgical History  Procedure Laterality Date  . Hand surgery      Colman Cater MS,RD,CSG,LDN Office:  [phone] Pager: [phone]

## 2014-04-23 NOTE — Evaluation (Signed)
Clinical/Bedside Swallow Evaluation Patient Details  Name: Gabriel Reynolds MRN: [id number] Date of Birth: 01-Mar-1955  Today's Date: 04/23/2014 Time: SLP Start Time (ACUTE ONLY): 1205 SLP Stop Time (ACUTE ONLY): 1225 SLP Time Calculation (min) (ACUTE ONLY): 20 min  Past Medical History:  Past Medical History  Diagnosis Date  . Hypertension   . Diabetes mellitus    Past Surgical History:  Past Surgical History  Procedure Laterality Date  . Hand surgery     HPI:  Pt admitted 3/18- PMH of DM and ETOH abuse- admitted with generalized edema including angioedema and facial swelling, tachycardia- noted to have marked LE edema with enlarged heart on CXR. Swelling likely related to lisinopril. Evidence of cirrhosis of liver. Pt was intubated 3/20 (required multiple attempts d/t poorly visualized vocal folds; self-extubated 3/26. Angioedema recovered, renal function improving, complaining of poor appetite. Trace bilateral pleural effusions noted on CXR 3/27. Bedside swallow eval ordered to evaluate swallow function post-prolonged extubation.   Assessment / Plan / Recommendation Clinical Impression  Pt demonstrated no overt s/s of aspiration at bedside; swallow was timely- suspect mildly reduced hyolaryngeal excursion (upon palpation). Pt still with generalized weakness resulting in prolonged oral phase with solids. Wife at bedside reports that pt continues to be poorly oriented. Pt was, however, able to follow directions with ease and carry on a simple conversation. Aspiration risk appears mild-moderate at this time- risk factors include decreased cognitive status, decreased respiratory status, and prolonged 6-day intubation. Recommend initiating dysphagia 3 diet, thin liquids, meds whole in liquid or crushed in puree if difficulties arise. Recommend providing intermittent supervision for pt to cue for small bites/ sips. Speech therapy will continue to follow for diet tolerance/ advancement.     Aspiration Risk  Moderate    Diet Recommendation Dysphagia 3 (Mechanical Soft);Thin liquid   Liquid Administration via: Cup;Straw Medication Administration: Whole meds with liquid Supervision: Patient able to self feed;Intermittent supervision to cue for compensatory strategies Compensations: Slow rate;Small sips/bites Postural Changes and/or Swallow Maneuvers: Seated upright 90 degrees    Other  Recommendations Oral Care Recommendations: Oral care BID   Follow Up Recommendations  24 hour supervision/assistance    Frequency and Duration min 1 x/week  1 week   Pertinent Vitals/Pain Pain in leg- pt rated 6 on scale of 1-10; RN notified    SLP Swallow Goals     Swallow Study Prior Functional Status       General HPI: Pt admitted 3/18- PMH of DM and ETOH abuse- admitted with generalized edema including angioedema and facial swelling, tachycardia- noted to have marked LE edema with enlarged heart on CXR. Swelling likely related to lisinopril. Evidence of cirrhosis of liver. Pt was intubated 3/20 (required multiple attempts d/t poorly visualized vocal folds; self-extubated 3/26. Angioedema recovered, renal function improving, complaining of poor appetite. Trace bilateral pleural effusions noted on CXR 3/27. Bedside swallow eval ordered to evaluate swallow function post-prolonged extubation. Type of Study: Bedside swallow evaluation Diet Prior to this Study: NPO Temperature Spikes Noted: No Respiratory Status: Nasal cannula History of Recent Intubation: Yes Length of Intubations (days): 6 days Date extubated: 04/21/14 Behavior/Cognition: Alert;Cooperative Oral Cavity - Dentition: Adequate natural dentition Self-Feeding Abilities: Able to feed self Patient Positioning: Upright in bed Baseline Vocal Quality: Low vocal intensity Volitional Cough: Strong Volitional Swallow: Able to elicit    Oral/Motor/Sensory Function Overall Oral Motor/Sensory Function: Appears within functional  limits for tasks assessed Labial ROM: Within Functional Limits Labial Symmetry: Within Functional Limits Labial Strength: Within Functional Limits  Lingual Symmetry: Within Functional Limits Lingual Strength: Within Functional Limits Lingual Sensation: Within Functional Limits   Ice Chips Ice chips: Within functional limits Presentation: Spoon   Thin Liquid Thin Liquid: Within functional limits Presentation: Cup;Straw    Nectar Thick Nectar Thick Liquid: Not tested   Honey Thick Honey Thick Liquid: Not tested   Puree Puree: Within functional limits Presentation: Self Fed;Spoon   Solid   GO    Solid: Impaired Presentation: Self Fed Oral Phase Impairments: Impaired mastication;Other (comment) ((prolonged))       Oleksiak, Amy K, MA, CCC-SLP 04/23/2014,12:40 PM

## 2014-04-23 NOTE — Progress Notes (Signed)
Subjective: Interval History: Patient feels much better. Denies any difficulty in breathing. Patient however complains of feeling weak and poor appetite.  Objective: Vital signs in last 24 hours: Temp:  [97.5 F (36.4 C)-98.2 F (36.8 C)] 97.6 F (36.4 C) (03/28 0800) Pulse Rate:  [68-124] 114 (03/28 0945) Resp:  [19-26] 22 (03/28 0945) BP: (101-131)/(47-82) 113/53 mmHg (03/28 0945) SpO2:  [99 %-100 %] 100 % (03/28 0945) FiO2 (%):  [32 %] 32 % (03/28 0100) Weight:  [125.1 kg (275 lb 12.7 oz)] 125.1 kg (275 lb 12.7 oz) (03/28 0500) Weight change: 0.9 kg (1 lb 15.8 oz)  Intake/Output from previous day: 03/27 0701 - 03/28 0700 In: 1968 [P.O.:120; I.V.:1848] Out: 1525 [Urine:1525] Intake/Output this shift: Total I/O In: -  Out: 300 [Urine:300]   generally patient is alert and in no apparent distress. Chest: Decreased breath sounds bilaterally. Heart exam revealed regular rate and rhythm Abdomen: Distended but nontender and positive bowel sound. Extremities: He has no edema    Lab Results:  Recent Labs  04/22/14 0448 04/23/14 0536  WBC 20.5* 18.7*  HGB 10.3* 9.3*  HCT 32.1* 28.3*  PLT 238 219   BMET:   Recent Labs  04/22/14 0448 04/23/14 0536  NA 148* 143  K 3.1* 3.8  CL 104 102  CO2 31 30  GLUCOSE 294* 350*  BUN 89* 89*  CREATININE 1.79* 1.77*  CALCIUM 9.2 9.0   No results for input(s): PTH in the last 72 hours. Iron Studies: No results for input(s): IRON, TIBC, TRANSFERRIN, FERRITIN in the last 72 hours.  Studies/Results: Dg Chest Port 1 View  04/22/2014   CLINICAL DATA:  Shortness of breath, extubated  EXAM: PORTABLE CHEST - 1 VIEW  COMPARISON:  04/19/2014  FINDINGS: Endotracheal tube has been removed. Mild enlargement of the cardiac silhouette is noted without evidence for edema. Trace bilateral pleural effusions are noted with persistent retrocardiac opacity. Right lung is grossly clear.  IMPRESSION: Persistent hypoaeration with trace bilateral pleural  effusions and retrocardiac opacity.   Electronically Signed   By: Christiana PellantGretchen  Green M.D.   On: 04/22/2014 09:21    I have reviewed the patient's current medications.  Assessment/Plan: Problem #1 acute kidney injury: His renal function continue to improve  Probl. Patient complains of poor appetite otherwise he doesn't have any nausea or vomiting. probem #2 anasarca: Patient on Lasix and he has 1500 cc of urine out . His anasarca has improved significantly. Problem #3 liver cirrhosis Problem #4 severe combined systolic and diastolic dysfunction Problem #5 respiratory failure: Patient presently is extubated. He doesn't seem to be in any apparent distress.  Problem #6  sepsis: patient afebrile however his white blood cell  count is still high.  Problem #7 angioedema: Has recovered  Problem #8 hypokalemia: His potassium has corrected  Problem #9 hypernatremia: Possibly from lack of free water. His sodium has corrected Plan: We'll continue with present treatment We'll check his basic metabolic panel in the morning.   LOS: 10 days   Tyneka Scafidi S 04/23/2014,10:33 AM

## 2014-04-23 NOTE — Progress Notes (Signed)
Patient ID: Gabriel Reynolds, male   DOB: 08-21-1955, 59 y.o.   MRN: [id number]     Subjective:    SOB improving.   Objective:   Temp:  [97.5 F (36.4 C)-98.2 F (36.8 C)] 97.6 F (36.4 C) (03/28 0800) Pulse Rate:  [80-122] 118 (03/28 0500) Resp:  [16-25] 23 (03/28 0500) BP: (101-131)/(51-82) 110/51 mmHg (03/28 0500) SpO2:  [99 %-100 %] 100 % (03/28 0500) FiO2 (%):  [32 %] 32 % (03/28 0100) Weight:  [275 lb 12.7 oz (125.1 kg)] 275 lb 12.7 oz (125.1 kg) (03/28 0500) Last BM Date: 04/21/14  Filed Weights   04/21/14 0500 04/22/14 0447 04/23/14 0500  Weight: 282 lb 6.6 oz (128.1 kg) 273 lb 13 oz (124.2 kg) 275 lb 12.7 oz (125.1 kg)    Intake/Output Summary (Last 24 hours) at 04/23/14 5784 Last data filed at 04/23/14 0800  Gross per 24 hour  Intake   1968 ml  Output   1825 ml  Net    143 ml    Telemetry: aflutter elevated rates 120s  Exam:  General: NAD  Resp: coarse bilateral bases  Cardiac: irreg, no m/r/g, JVD just below angle of jaw  GI: abdomen soft, NT, ND  MSK: 2+ RLE edema, trace LLE edema.   Neuro: no focal deficits    Lab Results:  Basic Metabolic Panel:  Recent Labs Lab 04/16/14 0900 04/17/14 0426  04/21/14 0435 04/22/14 0448 04/22/14 0859 04/23/14 0536  NA 133* 134*  < > 147* 148*  --  143  K 5.0 4.1  < > 3.4* 3.1*  --  3.8  CL 99 99  < > 103 104  --  102  CO2 22 20  < > 33* 31  --  30  GLUCOSE 154* 109*  < > 317* 294*  --  350*  BUN 65* 77*  < > 88* 89*  --  89*  CREATININE 4.26* 4.25*  < > 1.95* 1.79*  --  1.77*  CALCIUM 8.9 9.0  < > 8.8 9.2  --  9.0  MG 1.7 2.1  --   --   --  2.0  --   < > = values in this interval not displayed.  Liver Function Tests:  Recent Labs Lab 04/16/14 0900  AST 97*  ALT 54*  ALKPHOS 81  BILITOT 1.8*  PROT 6.4  ALBUMIN 3.1*    CBC:  Recent Labs Lab 04/21/14 0435 04/22/14 0448 04/23/14 0536  WBC 10.8* 20.5* 18.7*  HGB 10.6* 10.3* 9.3*  HCT 32.2* 32.1* 28.3*  MCV 96.4 97.0 96.6  PLT 135*  238 219    Cardiac Enzymes: No results for input(s): CKTOTAL, CKMB, CKMBINDEX, TROPONINI in the last 168 hours.  BNP: No results for input(s): PROBNP in the last 8760 hours.  Coagulation:  Recent Labs Lab 04/16/14 1006 04/17/14 0426  INR 1.59* 1.74*    ECG:   Medications:   Scheduled Medications: . antiseptic oral rinse  7 mL Mouth Rinse QID  . aspirin  81 mg Per Tube Daily  . cefTRIAXone (ROCEPHIN)  IV  2 g Intravenous Q24H  . chlorhexidine  15 mL Mouth Rinse BID  . famotidine (PEPCID) IV  20 mg Intravenous Q24H  . feeding supplement (PRO-STAT SUGAR FREE 64)  30 mL Per Tube BID  . folic acid  1 mg Per Tube Daily  . insulin aspart  0-15 Units Subcutaneous 6 times per day  . insulin aspart  3 Units Subcutaneous Q4H  .  insulin detemir  50 Units Subcutaneous Q lunch  . methylPREDNISolone (SOLU-MEDROL) injection  40 mg Intravenous Q24H  . multivitamin with minerals  1 tablet Per Tube Daily  . sodium chloride  10-40 mL Intracatheter Q12H  . sodium chloride  10-40 mL Intracatheter Q12H  . sodium chloride  3 mL Intravenous Q12H  . thiamine  100 mg Oral Daily   Or  . thiamine  100 mg Intravenous Daily     Infusions: . dextrose 5 % with KCl 20 mEq / L 20 mEq (04/23/14 0100)  . diltiazem (CARDIZEM) infusion 5 mg/hr (04/23/14 0734)  . feeding supplement (VITAL AF 1.2 CAL) 1,000 mL (04/21/14 1629)  . heparin 900 Units/hr (04/23/14 0100)     PRN Medications:  albuterol, diphenhydrAMINE, fentaNYL, fentaNYL, LORazepam, metoprolol, naphazoline, ondansetron **OR** ondansetron (ZOFRAN) IV, sodium chloride, sodium chloride     Assessment/Plan     1. Acute on chronic combined systolic/diastolic HF - 03/2014 echo LVEF 20-25%, no WMAs, restrictive diastolic function, moderate to severe RV dysfunction - hypersensitivity reaction to ACE-I with angioedema, treated with steroids - admit Cr 4.2, has trended down since admission - initially managed with dobutamine gtt and lasix  drip, now both weaned off. He is positive 440 mL yesterday and negative 9.5 liters since admission. Cr continues to trend down, 1.77 today.  - would recommend IV lasix 40mg  IV x 2 today if renal approves.   2. EtOH withdrawal - managed by primary team  3. DVT - on heparin gtt  4. Hypernatremia - has improved since holding lasix.   5. Sepsis with bacteremia - abx per primary team  6. AKI - Cr trending down, he is followed by renal.   7. Atrial flutter - on hep gtt - has been on dilt gtt, would wean in the setting of systolic dysfunction and acute heart failure. No great options for his aflutter, significant LFT abnormalities so not great candidate for amio and with recent acute systolic HF will need to follow closely while starting beta blocker. Start lopressor 25mg  q6 hrs with hold parameters. Will add on hepatic panel to see if LFTs improved in case we need to consider amio.      Dina RichJonathan Kodi Steil, M.D.

## 2014-04-23 NOTE — Progress Notes (Signed)
Results for Geri SeminoleDAMS, Damacio E (MRN [id number]) as of 04/23/2014 14:33  Ref. Range 04/22/2014 20:16 04/22/2014 23:34 04/23/2014 04:07 04/23/2014 07:34 04/23/2014 11:12  Glucose-Capillary Latest Range: 70-99 mg/dL 782219 (H) 956294 (H) 213346 (H) 339 (H) 383 (H)  3/28 Noted increase in Levemir to 50 units/day and decrease in steroids. If BS continue to be elevated, might consider adding 3 units q 4 to cover tube feeding.  Mellissa KohutSherrie Itali Mckendry RD, CDE. M.Ed. Pager [phone] Inpatient Diabetes Coordinator

## 2014-04-23 NOTE — Progress Notes (Signed)
[facility] PROGRESS NOTE  MD SMOLA ZOX:[id number] DOB: Oct 10, 1955 DOA: 04/13/2014 PCP: Pcp Not In System  Assessment/Plan: Ventilatory dependent hypoxemic respiratory failure -Etiology likely a combination of decompensated CHF, angioedema and possibly DTs.  -Self extubated 3/24 requiring re-intubation. -Self-extubated again 3/26. -Doing well at present.  Acute combined systolic CHF/Biventricular Failure -Was able to wean off  dobutamine drip 3/24. -Renal has DC'd lasix drip due to hypernatremia and has started D5. -Appreciate cardiology following. -Is 9.4 L negative. -Since Na is improving, will start lasix 40 mg IV BID.  Tachyarrhythmia -Appears to be a flutter on telemetry review. -Cards following. -Recommendation is to wean off cardizem and place on scheduled metoprolol. -Start PO meds once swallow eval obtained.  Sepsis with bacteremia -2 out of 2 cultures with strep viridans. -Will narrow antibiotics to rocephin 3/24. -Possible source appears to be pneumonia giving left base consolidation on chest x-ray.  Hospital-acquired pneumonia/aspiration pneumonia -See above for details.  Angioedema -Tongue swelling much improved. -Continue steroids/Benadryl. -Presumed a late hypersensitivity reaction to ACE-I. -FFP given 3/25 and 3/26 for angioedema.  Alcohol abuse -DTs may have played a role in his acute respiratory decompensation over the weekend. -Continue thiamine/folate.  Bilateral DVTs  -Continue heparin drip. -Unable to confirm PE at this point given patient's unstable illness and renal dysfunction, but suspect this may be playing a role in his RV failure. -Transition to oral anticoagulants once ST eval complete and OK to start POs.  Acute renal failure -Improving  good urine output. -Have no records to determine chronicity of renal failure. -Cr down to 1.77 3/28.  Hypernatremia -Improved to 143. -Stop D5 infusion.  DM II -CBGs  increasing. -Increase levemir to 60 units.   Code Status: Full code Family Communication: Wife Gabriel Reynolds at bedside updated on plan of care  Disposition Plan: Keep in ICU today   Consultants:  Cardiology  Pulmonary   Antibiotics:  Rocephin  Subjective: C/o right leg pain.  Objective: Filed Vitals:   04/23/14 1400 04/23/14 1415 04/23/14 1430 04/23/14 1445  BP: 99/56 106/63 96/65 105/56  Pulse: 77 82 79 92  Temp:      TempSrc:      Resp: Height:      Weight:      SpO2: 100% 100% 100% 100%    Intake/Output Summary (Last 24 hours) at 04/23/14 1535 Last data filed at 04/23/14 0800  Gross per 24 hour  Intake   1848 ml  Output   1300 ml  Net    548 ml   Filed Weights   04/21/14 0500 04/22/14 0447 04/23/14 0500  Weight: 128.1 kg (282 lb 6.6 oz) 124.2 kg (273 lb 13 oz) 125.1 kg (275 lb 12.7 oz)    Exam:   General:  awake  Cardiovascular: Regular rate and rhythm  Respiratory: Decreased left base sounds, no wheezes  Abdomen: Soft, nontender, nondistended, positive bowel sounds  Extremities: 3+++ pitting edema on right, 1+ on left  Neurologic:  Sedated on vent  Data Reviewed: Basic Metabolic Panel:  Recent Labs Lab 04/17/14 0426 04/18/14 0450 04/20/14 0428 04/21/14 0435 04/22/14 0448 04/22/14 0859 04/23/14 0536  NA 134* 133* 148* 147* 148*  --  143  K 4.1 4.1 3.2* 3.4* 3.1*  --  3.8  CL 99 96 104 103 104  --  102  CO2 20 23 33* 33* 31  --  30  GLUCOSE 109* 271* 232* 317* 294*  --  350*  BUN 77*  84* 85* 88* 89*  --  89*  CREATININE 4.25* 3.46* 2.17* 1.95* 1.79*  --  1.77*  CALCIUM 9.0 8.7 8.9 8.8 9.2  --  9.0  MG 2.1  --   --   --   --  2.0  --    Liver Function Tests:  Recent Labs Lab 04/23/14 0600  AST 32  ALT 37  ALKPHOS 66  BILITOT 1.8*  PROT 6.4  ALBUMIN 3.1*   No results for input(s): LIPASE, AMYLASE in the last 168 hours. No results for input(s): AMMONIA in the last 168 hours. CBC:  Recent Labs Lab 04/18/14 0032  04/18/14 0450 04/20/14 0428 04/21/14 0435 04/22/14 0448 04/23/14 0536  WBC 7.7 8.6 11.3* 10.8* 20.5* 18.7*  NEUTROABS 6.4  --   --   --   --   --   HGB 13.0 12.7* 11.5* 10.6* 10.3* 9.3*  HCT 37.5* 36.5* 35.5* 32.2* 32.1* 28.3*  MCV 91.9 92.2 94.7 96.4 97.0 96.6  PLT 125* 139* 143* 135* 238 219   Cardiac Enzymes: No results for input(s): CKTOTAL, CKMB, CKMBINDEX, TROPONINI in the last 168 hours. BNP (last 3 results)  Recent Labs  04/13/14 1110  BNP 617.0*    ProBNP (last 3 results) No results for input(s): PROBNP in the last 8760 hours.  CBG:  Recent Labs Lab 04/22/14 2016 04/22/14 2334 04/23/14 0407 04/23/14 0734 04/23/14 1112  GLUCAP 219* 294* 346* 339* 383*    Recent Results (from the past 240 hour(s))  MRSA PCR Screening     Status: Abnormal   Collection Time: 04/13/14  4:00 PM  Result Value Ref Range Status   MRSA by PCR POSITIVE (A) NEGATIVE Final    Comment:        The GeneXpert MRSA Assay (FDA approved for NASAL specimens only), is one component of a comprehensive MRSA colonization surveillance program. It is not intended to diagnose MRSA infection nor to guide or monitor treatment for MRSA infections. RESULT CALLED TO, READ BACK BY AND VERIFIED WITH:  CUMMINGS,R @ 2205 ON 04/13/14 BY WOODIE,J   Culture, blood (x 2)     Status: None   Collection Time: 04/16/14 10:06 AM  Result Value Ref Range Status   Specimen Description BLOOD PORTA CATH DRAWN BY RN  Final   Special Requests BOTTLES DRAWN AEROBIC AND ANAEROBIC 6CC  Final   Culture   Final    VIRIDANS STREPTOCOCCUS Note: Gram Stain Report Called to,Read Back By and Verified With: MCDANIEL M. AT 0700 BY [email or web address] Performed at [facility] Performed at [facility]    Report Status 04/19/2014 FINAL  Final   Organism ID, Bacteria VIRIDANS STREPTOCOCCUS  Final      Susceptibility   Viridans streptococcus - MIC (ETEST)*    PENICILLIN .064 SENSITIVE Sensitive     * VIRIDANS  STREPTOCOCCUS  Culture, blood (x 2)     Status: None   Collection Time: 04/16/14 10:16 AM  Result Value Ref Range Status   Specimen Description BLOOD RIGHT ANTECUBITAL  Final   Special Requests BOTTLES DRAWN AEROBIC AND ANAEROBIC 10CC  Final   Culture   Final    VIRIDANS STREPTOCOCCUS Note: SUSCEPTIBILITIES PERFORMED ON PREVIOUS CULTURE WITHIN THE LAST 5 DAYS. Note: Gram Stain Report Called to,Read Back By and Verified With: MISTY MCDANIEL RN 04/17/14 0700 BY RESSEGGER R Performed at [facility] Performed at [facility]    Report Status 04/19/2014 FINAL  Final     Studies: Dg Chest  Port 1 View  04/22/2014   CLINICAL DATA:  Shortness of breath, extubated  EXAM: PORTABLE CHEST - 1 VIEW  COMPARISON:  04/19/2014  FINDINGS: Endotracheal tube has been removed. Mild enlargement of the cardiac silhouette is noted without evidence for edema. Trace bilateral pleural effusions are noted with persistent retrocardiac opacity. Right lung is grossly clear.  IMPRESSION: Persistent hypoaeration with trace bilateral pleural effusions and retrocardiac opacity.   Electronically Signed   By: Christiana PellantGretchen  Green M.D.   On: 04/22/2014 09:21    Scheduled Meds: . antiseptic oral rinse  7 mL Mouth Rinse QID  . aspirin  81 mg Per Tube Daily  . cefTRIAXone (ROCEPHIN)  IV  2 g Intravenous Q24H  . chlorhexidine  15 mL Mouth Rinse BID  . famotidine (PEPCID) IV  20 mg Intravenous Q24H  . feeding supplement (GLUCERNA SHAKE)  237 mL Oral TID BM  . feeding supplement (PRO-STAT SUGAR FREE 64)  30 mL Oral BID  . folic acid  1 mg Per Tube Daily  . insulin aspart  0-15 Units Subcutaneous 6 times per day  . insulin aspart  3 Units Subcutaneous Q4H  . insulin detemir  50 Units Subcutaneous Q lunch  . methylPREDNISolone (SOLU-MEDROL) injection  40 mg Intravenous Q24H  . metoprolol tartrate  25 mg Oral Q6H  . multivitamin with minerals  1 tablet Per Tube Daily  . sodium chloride  10-40 mL Intracatheter Q12H  .  sodium chloride  10-40 mL Intracatheter Q12H  . sodium chloride  3 mL Intravenous Q12H  . thiamine  100 mg Oral Daily   Or  . thiamine  100 mg Intravenous Daily   Continuous Infusions: . dextrose 5 % with KCl 20 mEq / L 20 mEq (04/23/14 1516)  . diltiazem (CARDIZEM) infusion Stopped (04/23/14 1457)  . heparin 900 Units/hr (04/23/14 0100)    Principal Problem:   Acute respiratory failure with hypoxemia Active Problems:   Diabetes type 2, controlled   Angioedema   Acute combined systolic and diastolic CHF, NYHA class 4   Delirium tremens   DVT (deep venous thrombosis)   Ventilator dependence   Severe sepsis    Time spent: 35 minutes. Greater than 50% of this time was spent in direct contact with the patient coordinating care.    Gabriel Reynolds,Gabriel Reynolds  [facility] Pager [phone]  If 7PM-7AM, please contact night-coverage at [email or web address], password [email or web address] 04/23/2014, 3:35 PM  LOS: 10 days

## 2014-04-23 NOTE — Progress Notes (Signed)
Subjective: He says he feels better. He has no new complaints. He is still coughing some. He remains off the ventilator  Objective: Vital signs in last 24 hours: Temp:  [97.5 F (36.4 C)-98.2 F (36.8 C)] 97.9 F (36.6 C) (03/28 0400) Pulse Rate:  [80-129] 118 (03/28 0500) Resp:  [16-25] 23 (03/28 0500) BP: (101-131)/(51-82) 110/51 mmHg (03/28 0500) SpO2:  [98 %-100 %] 100 % (03/28 0500) FiO2 (%):  [32 %] 32 % (03/28 0100) Weight:  [125.1 kg (275 lb 12.7 oz)] 125.1 kg (275 lb 12.7 oz) (03/28 0500) Weight change: 0.9 kg (1 lb 15.8 oz) Last BM Date: 04/21/14  Intake/Output from previous day: 03/27 0701 - 03/28 0700 In: 1968 [P.O.:120; I.V.:1848] Out: 1525 [Urine:1525]  PHYSICAL EXAM General appearance: alert, cooperative and mild distress Resp: rhonchi bilaterally Cardio: He looks like he is in atrial flutter with a rate of about 120 GI: soft, non-tender; bowel sounds normal; no masses,  no organomegaly Extremities: extremities normal, atraumatic, no cyanosis or edema  Lab Results:  Results for orders placed or performed during the hospital encounter of 04/13/14 (from the past 48 hour(s))  Glucose, capillary     Status: Abnormal   Collection Time: 04/21/14  8:00 AM  Result Value Ref Range   Glucose-Capillary 329 (H) 70 - 99 mg/dL  Glucose, capillary     Status: Abnormal   Collection Time: 04/21/14 11:35 AM  Result Value Ref Range   Glucose-Capillary 364 (H) 70 - 99 mg/dL  Heparin level (unfractionated)     Status: None   Collection Time: 04/22/14  4:48 AM  Result Value Ref Range   Heparin Unfractionated 0.65 0.30 - 0.70 IU/mL    Comment:        IF HEPARIN RESULTS ARE BELOW EXPECTED VALUES, AND PATIENT DOSAGE HAS BEEN CONFIRMED, SUGGEST FOLLOW UP TESTING OF ANTITHROMBIN III LEVELS.   CBC     Status: Abnormal   Collection Time: 04/22/14  4:48 AM  Result Value Ref Range   WBC 20.5 (H) 4.0 - 10.5 K/uL   RBC 3.31 (L) 4.22 - 5.81 MIL/uL   Hemoglobin 10.3 (L) 13.0 -  17.0 g/dL   HCT 32.1 (L) 39.0 - 52.0 %   MCV 97.0 78.0 - 100.0 fL   MCH 31.1 26.0 - 34.0 pg   MCHC 32.1 30.0 - 36.0 g/dL   RDW 13.5 11.5 - 15.5 %   Platelets 238 150 - 400 K/uL    Comment: DELTA CHECK NOTED RESULT REPEATED AND VERIFIED   Basic metabolic panel     Status: Abnormal   Collection Time: 04/22/14  4:48 AM  Result Value Ref Range   Sodium 148 (H) 135 - 145 mmol/L   Potassium 3.1 (L) 3.5 - 5.1 mmol/L   Chloride 104 96 - 112 mmol/L   CO2 31 19 - 32 mmol/L   Glucose, Bld 294 (H) 70 - 99 mg/dL   BUN 89 (H) 6 - 23 mg/dL   Creatinine, Ser 1.79 (H) 0.50 - 1.35 mg/dL   Calcium 9.2 8.4 - 10.5 mg/dL   GFR calc non Af Amer 39 (L) >90 mL/min   GFR calc Af Amer 45 (L) >90 mL/min    Comment: (NOTE) The eGFR has been calculated using the CKD EPI equation. This calculation has not been validated in all clinical situations. eGFR's persistently <90 mL/min signify possible Chronic Kidney Disease.    Anion gap 13 5 - 15  Blood gas, arterial     Status: Abnormal  Collection Time: 04/22/14  5:02 AM  Result Value Ref Range   FIO2 0.35 %   Delivery systems VENTURI MASK    pH, Arterial 7.461 (H) 7.350 - 7.450   pCO2 arterial 41.5 35.0 - 45.0 mmHg   pO2, Arterial 100.0 80.0 - 100.0 mmHg   Bicarbonate 29.2 (H) 20.0 - 24.0 mEq/L   TCO2 26.3 0 - 100 mmol/L   Acid-Base Excess 5.3 (H) 0.0 - 2.0 mmol/L   O2 Saturation 97.6 %   Patient temperature 37.0    Collection site RIGHT RADIAL    Drawn by [phone]    Sample type ARTERIAL DRAW    Allens test (pass/fail) PASS PASS  Magnesium     Status: None   Collection Time: 04/22/14  8:59 AM  Result Value Ref Range   Magnesium 2.0 1.5 - 2.5 mg/dL  Basic metabolic panel     Status: Abnormal   Collection Time: 04/23/14  5:36 AM  Result Value Ref Range   Sodium 143 135 - 145 mmol/L   Potassium 3.8 3.5 - 5.1 mmol/L    Comment: DELTA CHECK NOTED   Chloride 102 96 - 112 mmol/L   CO2 30 19 - 32 mmol/L   Glucose, Bld 350 (H) 70 - 99 mg/dL   BUN 89  (H) 6 - 23 mg/dL   Creatinine, Ser 1.77 (H) 0.50 - 1.35 mg/dL   Calcium 9.0 8.4 - 10.5 mg/dL   GFR calc non Af Amer 40 (L) >90 mL/min   GFR calc Af Amer 46 (L) >90 mL/min    Comment: (NOTE) The eGFR has been calculated using the CKD EPI equation. This calculation has not been validated in all clinical situations. eGFR's persistently <90 mL/min signify possible Chronic Kidney Disease.    Anion gap 11 5 - 15  CBC     Status: Abnormal   Collection Time: 04/23/14  5:36 AM  Result Value Ref Range   WBC 18.7 (H) 4.0 - 10.5 K/uL   RBC 2.93 (L) 4.22 - 5.81 MIL/uL   Hemoglobin 9.3 (L) 13.0 - 17.0 g/dL   HCT 28.3 (L) 39.0 - 52.0 %   MCV 96.6 78.0 - 100.0 fL   MCH 31.7 26.0 - 34.0 pg   MCHC 32.9 30.0 - 36.0 g/dL   RDW 13.7 11.5 - 15.5 %   Platelets 219 150 - 400 K/uL  Heparin level (unfractionated)     Status: None   Collection Time: 04/23/14  5:41 AM  Result Value Ref Range   Heparin Unfractionated 0.48 0.30 - 0.70 IU/mL    Comment:        IF HEPARIN RESULTS ARE BELOW EXPECTED VALUES, AND PATIENT DOSAGE HAS BEEN CONFIRMED, SUGGEST FOLLOW UP TESTING OF ANTITHROMBIN III LEVELS.     ABGS  Recent Labs  04/22/14 0502  PHART 7.461*  PO2ART 100.0  TCO2 26.3  HCO3 29.2*   CULTURES Recent Results (from the past 240 hour(s))  MRSA PCR Screening     Status: Abnormal   Collection Time: 04/13/14  4:00 PM  Result Value Ref Range Status   MRSA by PCR POSITIVE (A) NEGATIVE Final    Comment:        The GeneXpert MRSA Assay (FDA approved for NASAL specimens only), is one component of a comprehensive MRSA colonization surveillance program. It is not intended to diagnose MRSA infection nor to guide or monitor treatment for MRSA infections. RESULT CALLED TO, READ BACK BY AND VERIFIED WITH:  CUMMINGS,R @ 2205 ON 04/13/14 BY  WOODIE,J   Culture, blood (x 2)     Status: None   Collection Time: 04/16/14 10:06 AM  Result Value Ref Range Status   Specimen Description BLOOD PORTA CATH  DRAWN BY RN  Final   Special Requests BOTTLES DRAWN AEROBIC AND ANAEROBIC 6CC  Final   Culture   Final    VIRIDANS STREPTOCOCCUS Note: Gram Stain Report Called to,Read Back By and Verified With: Sheldon.R Performed at [facility] Performed at [facility]    Report Status 04/19/2014 FINAL  Final   Organism ID, Bacteria VIRIDANS STREPTOCOCCUS  Final      Susceptibility   Viridans streptococcus - MIC (ETEST)*    PENICILLIN .064 SENSITIVE Sensitive     * VIRIDANS STREPTOCOCCUS  Culture, blood (x 2)     Status: None   Collection Time: 04/16/14 10:16 AM  Result Value Ref Range Status   Specimen Description BLOOD RIGHT ANTECUBITAL  Final   Special Requests BOTTLES DRAWN AEROBIC AND ANAEROBIC 10CC  Final   Culture   Final    VIRIDANS STREPTOCOCCUS Note: SUSCEPTIBILITIES PERFORMED ON PREVIOUS CULTURE WITHIN THE LAST 5 DAYS. Note: Gram Stain Report Called to,Read Back By and Verified With: MISTY MCDANIEL RN 04/17/14 0700 BY RESSEGGER R Performed at [facility] Performed at [facility]    Report Status 04/19/2014 FINAL  Final   Studies/Results: Dg Chest Port 1 View  04/22/2014   CLINICAL DATA:  Shortness of breath, extubated  EXAM: PORTABLE CHEST - 1 VIEW  COMPARISON:  04/19/2014  FINDINGS: Endotracheal tube has been removed. Mild enlargement of the cardiac silhouette is noted without evidence for edema. Trace bilateral pleural effusions are noted with persistent retrocardiac opacity. Right lung is grossly clear.  IMPRESSION: Persistent hypoaeration with trace bilateral pleural effusions and retrocardiac opacity.   Electronically Signed   By: Conchita Paris M.D.   On: 04/22/2014 09:21    Medications:  Prior to Admission:  Prescriptions prior to admission  Medication Sig Dispense Refill Last Dose  . insulin detemir (LEVEMIR) 100 UNIT/ML injection Inject 60 Units into the skin daily.     04/12/2014 at Unknown time  . metFORMIN  (GLUCOPHAGE) 500 MG tablet Take 500 mg by mouth daily.     04/12/2014 at Unknown time  . PRESCRIPTION MEDICATION Blood pressure med awaiting va clairfication      Scheduled: . antiseptic oral rinse  7 mL Mouth Rinse QID  . aspirin  81 mg Per Tube Daily  . cefTRIAXone (ROCEPHIN)  IV  2 g Intravenous Q24H  . chlorhexidine  15 mL Mouth Rinse BID  . famotidine (PEPCID) IV  20 mg Intravenous Q24H  . feeding supplement (PRO-STAT SUGAR FREE 64)  30 mL Per Tube BID  . folic acid  1 mg Per Tube Daily  . insulin aspart  0-15 Units Subcutaneous 6 times per day  . insulin aspart  3 Units Subcutaneous Q4H  . insulin detemir  50 Units Subcutaneous Q lunch  . methylPREDNISolone (SOLU-MEDROL) injection  40 mg Intravenous Q24H  . multivitamin with minerals  1 tablet Per Tube Daily  . sodium chloride  10-40 mL Intracatheter Q12H  . sodium chloride  10-40 mL Intracatheter Q12H  . sodium chloride  3 mL Intravenous Q12H  . thiamine  100 mg Oral Daily   Or  . thiamine  100 mg Intravenous Daily   Continuous: . dextrose 5 % with KCl 20 mEq / L 20 mEq (04/23/14 0100)  .  diltiazem (CARDIZEM) infusion 15 mg/hr (04/23/14 0036)  . feeding supplement (VITAL AF 1.2 CAL) 1,000 mL (04/21/14 1629)  . heparin 900 Units/hr (04/23/14 0100)   AJL:UNGBMBOMQ, diphenhydrAMINE, fentaNYL, fentaNYL, LORazepam, metoprolol, naphazoline, ondansetron **OR** ondansetron (ZOFRAN) IV, sodium chloride, sodium chloride  Assesment: He had acute respiratory failure with hypoxia requiring ventilator support. This is from a combination of acute combined systolic and diastolic heart failure, angioedema of his tongue presumed to be a late reaction to ACE inhibitor and delirium tremens. He self extubated on the 26th and has done well.  His heart failure is better but some of his medications and had to be adjusted because of hypernatremia. He is down approximately 9 L since admission.  He had sepsis and has positive blood cultures for strep  viridans and the source is felt to be pneumonia and he is on appropriate treatment for that. Principal Problem:   Acute respiratory failure with hypoxemia Active Problems:   Diabetes type 2, controlled   Angioedema   Acute combined systolic and diastolic CHF, NYHA class 4   Delirium tremens   DVT (deep venous thrombosis)   Ventilator dependence   Severe sepsis    Plan: Continue current treatments    LOS: 10 days   Whitney Bingaman L 04/23/2014, 7:13 AM

## 2014-04-24 LAB — HEPARIN LEVEL (UNFRACTIONATED): Heparin Unfractionated: 0.18 IU/mL — ABNORMAL LOW (ref 0.30–0.70)

## 2014-04-24 LAB — BASIC METABOLIC PANEL
Anion gap: 11 (ref 5–15)
BUN: 96 mg/dL — ABNORMAL HIGH (ref 6–23)
CALCIUM: 9.6 mg/dL (ref 8.4–10.5)
CO2: 31 mmol/L (ref 19–32)
Chloride: 104 mmol/L (ref 96–112)
Creatinine, Ser: 1.77 mg/dL — ABNORMAL HIGH (ref 0.50–1.35)
GFR calc non Af Amer: 40 mL/min — ABNORMAL LOW (ref >90)
GFR, EST AFRICAN AMERICAN: 46 mL/min — AB (ref >90)
Glucose, Bld: 95 mg/dL (ref 70–99)
Potassium: 3.6 mmol/L (ref 3.5–5.1)
Sodium: 146 mmol/L — ABNORMAL HIGH (ref 135–145)

## 2014-04-24 LAB — GLUCOSE, CAPILLARY
GLUCOSE-CAPILLARY: 329 mg/dL — AB (ref 70–99)
GLUCOSE-CAPILLARY: 133 mg/dL — AB (ref 70–99)
GLUCOSE-CAPILLARY: 165 mg/dL — AB (ref 70–99)
GLUCOSE-CAPILLARY: 204 mg/dL — AB (ref 70–99)
Glucose-Capillary: 105 mg/dL — ABNORMAL HIGH (ref 70–99)
Glucose-Capillary: 144 mg/dL — ABNORMAL HIGH (ref 70–99)
Glucose-Capillary: 219 mg/dL — ABNORMAL HIGH (ref 70–99)
Glucose-Capillary: 160 mg/dL — ABNORMAL HIGH (ref 70–99)

## 2014-04-24 LAB — PROTIME-INR
INR: 1.17 (ref 0.00–1.49)
Prothrombin Time: 15 seconds (ref 11.6–15.2)

## 2014-04-24 MED ORDER — INSULIN DETEMIR 100 UNIT/ML ~~LOC~~ SOLN
60.0000 [IU] | Freq: Every day | SUBCUTANEOUS | Status: DC
  Administered 2014-04-24 – 2014-04-25 (×2): 60 [IU] via SUBCUTANEOUS
  Filled 2014-04-24 (×4): qty 0.6

## 2014-04-24 MED ORDER — FUROSEMIDE 10 MG/ML IJ SOLN
40.0000 mg | Freq: Every day | INTRAMUSCULAR | Status: DC
  Administered 2014-04-25 – 2014-04-26 (×2): 40 mg via INTRAVENOUS
  Filled 2014-04-24 (×2): qty 4

## 2014-04-24 MED ORDER — INSULIN ASPART 100 UNIT/ML ~~LOC~~ SOLN
0.0000 [IU] | Freq: Three times a day (TID) | SUBCUTANEOUS | Status: DC
  Administered 2014-04-24: 2 [IU] via SUBCUTANEOUS
  Administered 2014-04-24: 3 [IU] via SUBCUTANEOUS
  Administered 2014-04-25: 1 [IU] via SUBCUTANEOUS
  Administered 2014-04-26 – 2014-04-27 (×3): 2 [IU] via SUBCUTANEOUS
  Administered 2014-04-27: 5 [IU] via SUBCUTANEOUS
  Administered 2014-04-29 (×2): 2 [IU] via SUBCUTANEOUS
  Administered 2014-04-29: 3 [IU] via SUBCUTANEOUS
  Administered 2014-04-30 (×2): 2 [IU] via SUBCUTANEOUS

## 2014-04-24 MED ORDER — INSULIN ASPART 100 UNIT/ML ~~LOC~~ SOLN
3.0000 [IU] | Freq: Three times a day (TID) | SUBCUTANEOUS | Status: DC
  Administered 2014-04-25: 3 [IU] via SUBCUTANEOUS

## 2014-04-24 MED ORDER — APIXABAN 5 MG PO TABS: 5.0000 mg | ORAL_TABLET | Freq: Two times a day (BID) | ORAL | Status: DC

## 2014-04-24 MED ORDER — APIXABAN 5 MG PO TABS
10.0000 mg | ORAL_TABLET | Freq: Two times a day (BID) | ORAL | Status: DC
  Administered 2014-04-24 – 2014-04-30 (×13): 10 mg via ORAL
  Filled 2014-04-24 (×13): qty 2

## 2014-04-24 MED ORDER — INSULIN DETEMIR 100 UNIT/ML ~~LOC~~ SOLN
70.0000 [IU] | Freq: Every day | SUBCUTANEOUS | Status: DC
  Filled 2014-04-24 (×2): qty 0.7

## 2014-04-24 NOTE — Clinical Social Work Placement (Signed)
Clinical Social Work Department CLINICAL SOCIAL WORK PLACEMENT NOTE 04/24/2014  Patient:  Gabriel SeminoleDAMS,[name]  Account Number:  [id number] Admit date:  04/13/2014  Clinical Social Worker:  Derenda FennelKARA Alta Shober, LCSW  Date/time:  04/24/2014 03:54 PM  Clinical Social Work is seeking post-discharge placement for this patient at the following level of care:   SKILLED NURSING   (*CSW will update this form in Epic as items are completed)   04/24/2014  Patient/family provided with [facility] Department of Clinical Social Work's list of facilities offering this level of care within the geographic area requested by the patient (or if unable, by the patient's family).  04/24/2014  Patient/family informed of their freedom to choose among providers that offer the needed level of care, that participate in Medicare, Medicaid or managed care program needed by the patient, have an available bed and are willing to accept the patient.  04/24/2014  Patient/family informed of [facility]' ownership interest in [facility], as well as of the fact that they are under no obligation to receive care at this facility.  PASARR submitted to EDS on 04/24/2014 PASARR number received on 04/24/2014  FL2 transmitted to all facilities in geographic area requested by pt/family on  04/24/2014 FL2 transmitted to all facilities within larger geographic area on   Patient informed that his/her managed care company has contracts with or will negotiate with  certain facilities, including the following:     Patient/family informed of bed offers received:   Patient chooses bed at  Physician recommends and patient chooses bed at    Patient to be transferred to  on   Patient to be transferred to facility by  Patient and family notified of transfer on  Name of family member notified:    The following physician request were entered in Epic:   Additional Comments:  Derenda FennelKara Deontray Hunnicutt, LCSW [phone]

## 2014-04-24 NOTE — Progress Notes (Signed)
Unable to palpate R pedal pulse. Able to obtain pulse with dopplar.

## 2014-04-24 NOTE — Clinical Social Work Psychosocial (Signed)
Clinical Social Work Department BRIEF PSYCHOSOCIAL ASSESSMENT 04/24/2014  Patient:  Gabriel Reynolds, Gabriel Reynolds     Account Number:  [id number]     Admit date:  04/13/2014  Clinical Social Worker:  Wyatt Haste  Date/Time:  04/24/2014 03:57 PM  Referred by:  CSW  Date Referred:  04/24/2014 Referred for  SNF Placement   Other Referral:   Interview type:  Patient Other interview type:   wife- [name]    PSYCHOSOCIAL DATA Living Status:  FAMILY Admitted from facility:   Level of care:   Primary support name:  [name] Primary support relationship to patient:  SPOUSE Degree of support available:   supportive    CURRENT CONCERNS Current Concerns  Post-Acute Placement   Other Concerns:    SOCIAL WORK ASSESSMENT / PLAN CSW met with pt and pt's wife at bedside. Pt alert and oriented x3. He states that he lives with his wife and son. Pt has been in hospital for 11 days and was on ventilator. His wife describes this as a scary few days, but is relieved pt is now progressing. He is on disability for diabetes. Pt sees [facility], but is not service connected per wife. At baseline, pt is independent and drives. Due to extended hospital stay, pt is very deconditioned. PT evaluated pt today and recommendation is for SNF. CSW discussed placement process and Medicare coverage/criteria. SNF list provided. They are agreeable to initiate bed search as pt recognizes he is too weak to return home right away. His wife works in the mornings, but is currently out on leave due to pt's hospital stay. Pt and wife request [place] if possible.   Assessment/plan status:  Psychosocial Support/Ongoing Assessment of Needs Other assessment/ plan:   Information/referral to community resources:   SNF list    PATIENT'S/FAMILY'S RESPONSE TO PLAN OF CARE: Pt and wife agree short term SNF would be beneficial for pt prior to return home. CSW will follow up.       Benay Pike, [place]

## 2014-04-24 NOTE — Progress Notes (Signed)
Inpatient Diabetes Program Recommendations  AACE/ADA: New Consensus Statement on Inpatient Glycemic Control (2013)  Target Ranges:  Prepandial:   less than 140 mg/dL      Peak postprandial:   less than 180 mg/dL (1-2 hours)      Critically ill patients:  140 - 180 mg/dL   Results for Geri SeminoleDAMS, [place] E (MRN [id number]) as of 04/24/2014 11:04  Ref. Range 04/23/2014 07:34 04/23/2014 11:12 04/23/2014 16:07 04/23/2014 20:15 04/24/2014 00:05 04/24/2014 04:03 04/24/2014 07:40  Glucose-Capillary Latest Range: 70-99 mg/dL 409339 (H) 811383 (H) 914329 (H) 219 (H) 144 (H) 105 (H) 133 (H)   Current orders for Inpatient glycemic control: Levemir 70 units daily with lunch, Novolog 0-15 units Q4H, Novolog 3 units Q4H for tube feeding coverage  Inpatient Diabetes Program Recommendations Insulin - Basal: Noted Levemir was increased this morning from 50 units to 70 units daily with lunch. However, CBGs today have been 144/105/133 (received Levemir 50 units on 04/23/14). Recommend decreasing Levemir to 55 units daily.  Insulin - Meal Coverage: Currently there is an order for Novolog 3 units Q4H for tube feeding coverage. Since patient is no longer on tube feeding and has poor appetite (per nursing staff), please consider discontinuing tube feeding coverage.   Thanks, Orlando PennerMarie Jamie Belger, RN, MSN, CCRN, CDE Diabetes Coordinator Inpatient Diabetes Program [phone] (Team Pager from 8am to 5pm) [phone] ([facility] office) [phone] [facility] office)

## 2014-04-24 NOTE — Evaluation (Signed)
Physical Therapy Evaluation Patient Details Name: Gabriel Reynolds MRN: [id number] DOB: 24-Dec-1955 Today's Date: 04/24/2014   History of Present Illness  With a hx of DM who presents with complaints of generalized edema and facial swelling in the setting of lisinopril. In the ED, the patient was noted to have marked LE edema with an enlarged heart seen on CXR. BNP was elevated at 617 with initial troponin of 0.09. The patient was also tachycardic with HR in the 120's, with patient receiving diltazem in ED. Cardiology was consulted and hospitalist was consulted for consideration for admission.  During the hospital stay pt was in respiratory failure and intubated .  He is now extubated and transferred to the medical floor.  Pt had been totally independent and living at home with his wife PTA.  Clinical Impression  Pt was seen for evaluation.  He states that he has not been out of bed since admission (since 04-13-14).  He has become profoundly deconditioned during that time.  Strength of the LLE is 3/5, RLE=2-/5 largely due to severe edema present in that leg.  He needs max assist to transfer to sitting and is unable to come to standing.  We will need to use at least the SARA lift to transfer his bed to chair.  He will need SNF at d/c and pt is agreeable.    Follow Up Recommendations SNF    Equipment Recommendations  Rolling walker with 5" wheels    Recommendations for Other Services   none    Precautions / Restrictions Precautions Precautions: Fall Restrictions Weight Bearing Restrictions: No      Mobility  Bed Mobility Overal bed mobility: Needs Assistance Bed Mobility: Supine to Sit     Supine to sit: Max assist;HOB elevated     General bed mobility comments: pt needs full assist to move the RLE in the bed  Transfers Overall transfer level: Needs assistance   Transfers: Sit to/from Stand Sit to Stand: +2 safety/equipment;From elevated surface         General transfer  comment: pt is unable to stand from sitting even with HOB elevated.Marland Kitchen.Marland Kitchen.He will need the SARA lift at a minimum in order to transfer  Ambulation/Gait Ambulation/Gait assistance:  (unable)                                     Balance Overall balance assessment: Needs assistance Sitting-balance support: No upper extremity supported;Feet supported Sitting balance-Leahy Scale: Fair                                       Pertinent Vitals/Pain Pain Assessment: No/denies pain    Home Living Family/patient expects to be discharged to:: Skilled nursing facility                      Prior Function Level of Independence: Independent                       Extremity/Trunk Assessment               Lower Extremity Assessment: Generalized weakness;RLE deficits/detail RLE Deficits / Details: RLE is extremely edemetous and is therefore much weaker than the LLE which is of normal size       Communication   Communication: No difficulties  Cognition Arousal/Alertness:  Awake/alert Behavior During Therapy: WFL for tasks assessed/performed Overall Cognitive Status: Within Functional Limits for tasks assessed                                    Assessment/Plan    PT Assessment Patient needs continued PT services  PT Diagnosis Difficulty walking;Generalized weakness   PT Problem List Decreased strength;Decreased activity tolerance;Decreased mobility;Decreased balance;Cardiopulmonary status limiting activity;Obesity  PT Treatment Interventions Functional mobility training;Therapeutic exercise   PT Goals (Current goals can be found in the Care Plan section) Acute Rehab PT Goals Patient Stated Goal: to get his strength back PT Goal Formulation: With patient Time For Goal Achievement: 05/08/14 Potential to Achieve Goals: Good    Frequency Min 3X/week   Barriers to discharge   severe pt weakness and deconditioning will make  it impossible for family to manage him                   End of Session Equipment Utilized During Treatment: Gait belt Activity Tolerance: Patient tolerated treatment well Patient left: in bed;with call bell/phone within reach;with bed alarm set Nurse Communication: Mobility status         Time: 1610-9604 PT Time Calculation (min) (ACUTE ONLY): 58 min   Charges:   PT Evaluation $Initial PT Evaluation Tier I: 1 Procedure PT Treatments $Therapeutic Activity: 8-22 mins           Myrlene Broker L 04/24/2014, 2:04 PM

## 2014-04-24 NOTE — Progress Notes (Addendum)
[facility] PROGRESS NOTE  Gabriel Reynolds EAV:[id number] DOB: [birth year] DOA: 04/13/2014 PCP: Pcp Not In System  Assessment/Plan: Ventilatory dependent hypoxemic respiratory failure -Etiology likely a combination of decompensated CHF, angioedema and possibly DTs.  -Self extubated 3/24 requiring re-intubation. -Self-extubated again 3/26. -Doing well at present.  Acute combined systolic CHF/Biventricular Failure -Was able to wean off  dobutamine drip 3/24. -Appreciate cardiology following. -Is 10 L negative since admission. -Back on lasix 40 mg IV BID.  Tachyarrhythmia -Appears to be a flutter on telemetry review. -Cards following. -Recommendation is to wean off cardizem drip and place on scheduled metoprolol (weaned as of 3/29). -Will transition metoprolol to PO as he passed swallow evaluation.  Sepsis with bacteremia -2 out of 2 cultures with strep viridans. -Will narrow antibiotics to rocephin 3/24. -Possible source appears to be pneumonia giving left base consolidation on chest x-ray. -Would aim to treat for 2 weeks.  Hospital-acquired pneumonia/aspiration pneumonia -See above for details.  Angioedema -Tongue swelling much improved. -Continue steroids/Benadryl. -Presumed a late hypersensitivity reaction to ACE-I. -FFP given 3/25 and 3/26 for angioedema.  Alcohol abuse -DTs may have played a role in his acute respiratory decompensation over the weekend. -Continue thiamine/folate.  Bilateral DVTs  -Continue heparin drip. Start warfarin today as he has passed his swallow evaluation. -Unable to confirm PE at this point given patient's unstable illness and renal dysfunction, but suspect this may be playing a role in his RV failure. -Significant edema right leg looks improved today. Will try to doppler pulse as I am unable to feel one today.  Acute renal failure -Improving  good urine output. -Have no records to determine chronicity of renal failure. -Cr seems  to have plateaued at around 1.77.  Hypernatremia -Increasing again now that he is back on lasix. -D5 infusion has been discontinued. -Follow. Will need to carefully balance hypernatremia with volume status.  DM II -CBGs increasing. -Will increase levemir from 60 to 70 units. -Start meal coverage and Hs coverage now that he is eating. Addendum: CBGs from this am had not been recorded: review shows 133, 105, 144. Will decrease levemir back down to 60 units.  Deconditioning -PT evaluation. -Suspect may need SNF.  Code Status: Full code Family Communication: Wife [name] at bedside updated on plan of care  Disposition Plan: Transfer to floor.   Consultants:  Cardiology  Pulmonary   Antibiotics:  Rocephin  Subjective: C/o right leg pain.  Objective: Filed Vitals:   04/24/14 0200 04/24/14 0300 04/24/14 0400 04/24/14 0500  BP: 140/80 140/92 133/81 146/79  Pulse: 83 75 123 65  Temp:   97.7 F (36.5 C)   TempSrc:   Oral   Resp: Height:      Weight:    127 kg (279 lb 15.8 oz)  SpO2: 99% 97% 97% 97%    Intake/Output Summary (Last 24 hours) at 04/24/14 0931 Last data filed at 04/24/14 0902  Gross per 24 hour  Intake    665 ml  Output   1250 ml  Net   -585 ml   Filed Weights   04/22/14 0447 04/23/14 0500 04/24/14 0500  Weight: 124.2 kg (273 lb 13 oz) 125.1 kg (275 lb 12.7 oz) 127 kg (279 lb 15.8 oz)    Exam:   General:  awake  Cardiovascular: Regular rate and rhythm  Respiratory: Decreased left base sounds, no wheezes  Abdomen: Soft, nontender, nondistended, positive bowel sounds  Extremities: 3+++ pitting edema on right, 1+ on  left  Neurologic:  Sedated on vent  Data Reviewed: Basic Metabolic Panel:  Recent Labs Lab 04/20/14 0428 04/21/14 0435 04/22/14 0448 04/22/14 0859 04/23/14 0536 04/24/14 0426  NA 148* 147* 148*  --  143 146*  K 3.2* 3.4* 3.1*  --  3.8 3.6  CL 104 103 104  --  102 104  CO2 33* 33* 31  --  30 31  GLUCOSE  232* 317* 294*  --  350* 95  BUN 85* 88* 89*  --  89* 96*  CREATININE 2.17* 1.95* 1.79*  --  1.77* 1.77*  CALCIUM 8.9 8.8 9.2  --  9.0 9.6  MG  --   --   --  2.0  --   --    Liver Function Tests:  Recent Labs Lab 04/23/14 0600  AST 32  ALT 37  ALKPHOS 66  BILITOT 1.8*  PROT 6.4  ALBUMIN 3.1*   No results for input(s): LIPASE, AMYLASE in the last 168 hours. No results for input(s): AMMONIA in the last 168 hours. CBC:  Recent Labs Lab 04/18/14 0032 04/18/14 0450 04/20/14 0428 04/21/14 0435 04/22/14 0448 04/23/14 0536  WBC 7.7 8.6 11.3* 10.8* 20.5* 18.7*  NEUTROABS 6.4  --   --   --   --   --   HGB 13.0 12.7* 11.5* 10.6* 10.3* 9.3*  HCT 37.5* 36.5* 35.5* 32.2* 32.1* 28.3*  MCV 91.9 92.2 94.7 96.4 97.0 96.6  PLT 125* 139* 143* 135* 238 219   Cardiac Enzymes: No results for input(s): CKTOTAL, CKMB, CKMBINDEX, TROPONINI in the last 168 hours. BNP (last 3 results)  Recent Labs  04/13/14 1110  BNP 617.0*    ProBNP (last 3 results) No results for input(s): PROBNP in the last 8760 hours.  CBG:  Recent Labs Lab 04/22/14 2016 04/22/14 2334 04/23/14 0407 04/23/14 0734 04/23/14 1112  GLUCAP 219* 294* 346* 339* 383*    Recent Results (from the past 240 hour(s))  Culture, blood (x 2)     Status: None   Collection Time: 04/16/14 10:06 AM  Result Value Ref Range Status   Specimen Description BLOOD PORTA CATH DRAWN BY RN  Final   Special Requests BOTTLES DRAWN AEROBIC AND ANAEROBIC 6CC  Final   Culture   Final    VIRIDANS STREPTOCOCCUS Note: Gram Stain Report Called to,Read Back By and Verified With: MCDANIEL M. AT 0700 BY [email or web address].R Performed at [facility] Performed at [facility]    Report Status 04/19/2014 FINAL  Final   Organism ID, Bacteria VIRIDANS STREPTOCOCCUS  Final      Susceptibility   Viridans streptococcus - MIC (ETEST)*    PENICILLIN .064 SENSITIVE Sensitive     * VIRIDANS STREPTOCOCCUS  Culture, blood (x 2)     Status: None    Collection Time: 04/16/14 10:16 AM  Result Value Ref Range Status   Specimen Description BLOOD RIGHT ANTECUBITAL  Final   Special Requests BOTTLES DRAWN AEROBIC AND ANAEROBIC 10CC  Final   Culture   Final    VIRIDANS STREPTOCOCCUS Note: SUSCEPTIBILITIES PERFORMED ON PREVIOUS CULTURE WITHIN THE LAST 5 DAYS. Note: Gram Stain Report Called to,Read Back By and Verified With: MISTY MCDANIEL RN 04/17/14 0700 BY RESSEGGER R Performed at [facility] Performed at [facility]    Report Status 04/19/2014 FINAL  Final     Studies: No results found.  Scheduled Meds: . aspirin  81 mg Per Tube Daily  . cefTRIAXone (ROCEPHIN)  IV  2 g  Intravenous Q24H  . famotidine (PEPCID) IV  20 mg Intravenous Q24H  . feeding supplement (GLUCERNA SHAKE)  237 mL Oral TID BM  . feeding supplement (PRO-STAT SUGAR FREE 64)  30 mL Oral BID  . folic acid  1 mg Per Tube Daily  . furosemide  40 mg Intravenous BID  . insulin aspart  0-15 Units Subcutaneous 6 times per day  . insulin aspart  3 Units Subcutaneous Q4H  . insulin detemir  70 Units Subcutaneous Q lunch  . metoprolol tartrate  25 mg Oral Q6H  . multivitamin with minerals  1 tablet Per Tube Daily  . sodium chloride  10-40 mL Intracatheter Q12H  . sodium chloride  10-40 mL Intracatheter Q12H  . sodium chloride  3 mL Intravenous Q12H  . thiamine  100 mg Oral Daily   Or  . thiamine  100 mg Intravenous Daily   Continuous Infusions: . diltiazem (CARDIZEM) infusion Stopped (04/23/14 1457)  . heparin 900 Units/hr (04/24/14 0200)    Principal Problem:   Acute respiratory failure with hypoxemia Active Problems:   Diabetes type 2, controlled   Angioedema   Acute combined systolic and diastolic CHF, NYHA class 4   Delirium tremens   DVT (deep venous thrombosis)   Ventilator dependence   Severe sepsis    Time spent: 35 minutes. Greater than 50% of this time was spent in direct contact with the patient coordinating  care.    Chaya JanHERNANDEZ ACOSTA,ESTELA  [facility] Pager [phone]  If 7PM-7AM, please contact night-coverage at [email or web address], password [email or web address] 04/24/2014, 9:31 AM  LOS: 11 days

## 2014-04-24 NOTE — Progress Notes (Signed)
ANTICOAGULATION CONSULT NOTE  Pharmacy Consult for Eliquis Indication: DVT  Allergies  Allergen Reactions  . Ace Inhibitors Swelling  . Other Hives    wool   Patient Measurements: Height: 6\' 1"  (185.4 cm) Weight: 279 lb 15.8 oz (127 kg) IBW/kg (Calculated) : 79.9 Heparin Dosing Weight: 111.4 kg  Vital Signs: Temp: 97.7 F (36.5 C) (03/29 0400) Temp Source: Oral (03/29 0400) BP: 128/87 mmHg (03/29 0900) Pulse Rate: 118 (03/29 0900)  Labs:  Recent Labs  04/22/14 0448 04/23/14 0536 04/23/14 0541 04/24/14 0426 04/24/14 0430  HGB 10.3* 9.3*  --   --   --   HCT 32.1* 28.3*  --   --   --   PLT 238 219  --   --   --   LABPROT  --   --   --   --  15.0  INR  --   --   --   --  1.17  HEPARINUNFRC 0.65  --  0.48  --  0.18*  CREATININE 1.79* 1.77*  --  1.77*  --    Estimated Creatinine Clearance: 61.2 mL/min (by C-G formula based on Cr of 1.77).  Medical History: Past Medical History  Diagnosis Date  . Hypertension   . Diabetes mellitus    Medications:  Scheduled:  . aspirin  81 mg Per Tube Daily  . cefTRIAXone (ROCEPHIN)  IV  2 g Intravenous Q24H  . famotidine (PEPCID) IV  20 mg Intravenous Q24H  . feeding supplement (GLUCERNA SHAKE)  237 mL Oral TID BM  . feeding supplement (PRO-STAT SUGAR FREE 64)  30 mL Oral BID  . folic acid  1 mg Per Tube Daily  . [START ON 04/25/2014] furosemide  40 mg Intravenous Daily  . insulin aspart  0-9 Units Subcutaneous TID WC  . insulin aspart  3 Units Subcutaneous TID WC  . insulin detemir  60 Units Subcutaneous Q lunch  . metoprolol tartrate  25 mg Oral Q6H  . multivitamin with minerals  1 tablet Per Tube Daily  . sodium chloride  10-40 mL Intracatheter Q12H  . sodium chloride  10-40 mL Intracatheter Q12H  . sodium chloride  3 mL Intravenous Q12H  . thiamine  100 mg Oral Daily   Or  . thiamine  100 mg Intravenous Daily   Assessment: 59 yo obese M with + BLE DVT and Afib.  He is currently on heparin infusion.  HL was low this  morning and rate increased.  Cardiology has now requested switch to Eliquis.   No bleeding noted.   Chronic renal insufficiency noted (Scr 1.77), although estimated CrCl (using pt's actual body weight) ~7475ml/min.  Plan:  Eliquis 10mg  po BID x 7 days then decrease dose to 5mg  po bid D/C heparin once Eliquis started Monitor CBC & for s/sx of bleeding Provide patient education on Eliquis   Elson ClanLilliston, Martie Muhlbauer Michelle 04/24/2014,12:21 PM

## 2014-04-24 NOTE — Progress Notes (Signed)
He remains off the ventilator and has no respiratory complaints now. The swelling of his tongue is better but not totally resolved.  His chest is clear. Temperature 97.7 pulse is 65 respirations 21 blood pressure 146/79 and O2 sat 97% on nasal cannula.  He had an episode of respiratory failure that was multifactorial. He is markedly improved. He does not have any known baseline lung disease and is not having any respiratory complaints at this time.  I will plan to sign off. I will be glad to see him if needed.  Thanks for allowing me to participate in his care

## 2014-04-24 NOTE — Progress Notes (Signed)
Report called to L.Forte,RN. Patient transferred to 330 in stable condition with RN via bed.

## 2014-04-24 NOTE — Progress Notes (Signed)
Subjective: Interval History: He complains of some abdominal pain this morning. He denies any nausea or vomiting. His breathing is okay.  Objective: Vital signs in last 24 hours: Temp:  [97.6 F (36.4 C)-98.4 F (36.9 C)] 97.7 F (36.5 C) (03/29 0400) Pulse Rate:  [53-125] 65 (03/29 0500) Resp:  [15-28] 21 (03/29 0500) BP: (85-146)/(47-92) 146/79 mmHg (03/29 0500) SpO2:  [96 %-100 %] 97 % (03/29 0500) Weight:  [127 kg (279 lb 15.8 oz)] 127 kg (279 lb 15.8 oz) (03/29 0500) Weight change: 1.9 kg (4 lb 3 oz)  Intake/Output from previous day: 03/28 0701 - 03/29 0700 In: 425 [I.V.:225; IV Piggyback:200] Out: 1550 [Urine:1550] Intake/Output this shift:     generally patient is alert and in no apparent distress. Chest: Decreased breath sounds bilaterally. Heart exam revealed regular rate and rhythm Abdomen: Distended but nontender and positive bowel sound. Extremities: He has no edema on the left but he has 1+ edema on the right.    Lab Results:  Recent Labs  04/22/14 0448 04/23/14 0536  WBC 20.5* 18.7*  HGB 10.3* 9.3*  HCT 32.1* 28.3*  PLT 238 219   BMET:   Recent Labs  04/22/14 0448 04/23/14 0536  NA 148* 143  K 3.1* 3.8  CL 104 102  CO2 31 30  GLUCOSE 294* 350*  BUN 89* 89*  CREATININE 1.79* 1.77*  CALCIUM 9.2 9.0   No results for input(Reynolds): PTH in the last 72 hours. Iron Studies: No results for input(Reynolds): IRON, TIBC, TRANSFERRIN, FERRITIN in the last 72 hours.  Studies/Results: No results found.  I have reviewed the patient'Reynolds current medications.  Assessment/Plan: Problem #1 acute kidney injury: His renal function continue to improve . Presently no new blood work  probem #2 anasarca: Patient on Lasix and he has 1500 cc of urine out . Patient does not have any significant leg swelling. Problem #3 liver cirrhosis Problem #4 severe combined systolic and diastolic dysfunction Problem #5 respiratory failure: Patient seems to be comfortable. Problem #6   sepsis: patient afebrile however his white blood cell  count is still high.  Problem #7 angioedema: Has recovered  Problem #8 hypokalemia: His potassium has corrected  Problem #9 hypernatremia: His sodium is corrected. Plan: We'll continue with present treatment We'll check his basic metabolic panel today. We'll check his basic metabolic panel in the morning.   LOS: 11 days   Gabriel Reynolds 04/24/2014,7:32 AM

## 2014-04-24 NOTE — Progress Notes (Signed)
Consulting cardiologist: Dina Rich MD Primary Cardiologist: Prentice Docker MD  Cardiology Specific Problem List:  1. Acute on Chronic Systolic CHF 2. Hypersensitivity to ACE causing angioedema 3. DVT 4. ETOH withdrawal 5. Atrial flutter    Subjective:    NO complaints. Generalized weakness. Going to 3rd floor today.  Objective:   Temp:  [97.6 F (36.4 C)-98.4 F (36.9 C)] 97.7 F (36.5 C) (03/29 0400) Pulse Rate:  [53-125] 65 (03/29 0500) Resp:  [15-28] 21 (03/29 0500) BP: (85-146)/(50-92) 146/79 mmHg (03/29 0500) SpO2:  [96 %-100 %] 97 % (03/29 0500) Weight:  [279 lb 15.8 oz (127 kg)] 279 lb 15.8 oz (127 kg) (03/29 0500) Last BM Date: 04/23/14  Filed Weights   04/22/14 0447 04/23/14 0500 04/24/14 0500  Weight: 273 lb 13 oz (124.2 kg) 275 lb 12.7 oz (125.1 kg) 279 lb 15.8 oz (127 kg)    Intake/Output Summary (Last 24 hours) at 04/24/14 1024 Last data filed at 04/24/14 0902  Gross per 24 hour  Intake    665 ml  Output   1250 ml  Net   -585 ml    Telemetry: Atrial fib rates in the 50's -60's.   Exam:  General: No acute distress.  HEENT: Conjunctiva and lids normal, oropharynx clear.  Lungs: Clear to auscultation, nonlabored. Diminished in the bases, continues on O2.   Cardiac: No elevated JVP or bruits. IRRR, no gallop or rub.   Abdomen: Hyperactive  bowel sounds, nontender, nondistended.  Extremities: Right leg edema pitting  distal pulses diminished pulse in the right leg. Doppler confirms arterial pulse.   Neuropsychiatric: Alert but sleepy, and oriented x3, affect appropriate.   Lab Results:  Basic Metabolic Panel:  Recent Labs Lab 04/22/14 0448 04/22/14 0859 04/23/14 0536 04/24/14 0426  NA 148*  --  143 146*  K 3.1*  --  3.8 3.6  CL 104  --  102 104  CO2 31  --  30 31  GLUCOSE 294*  --  350* 95  BUN 89*  --  89* 96*  CREATININE 1.79*  --  1.77* 1.77*  CALCIUM 9.2  --  9.0 9.6  MG  --  2.0  --   --     Liver Function  Tests:  Recent Labs Lab 04/23/14 0600  AST 32  ALT 37  ALKPHOS 66  BILITOT 1.8*  PROT 6.4  ALBUMIN 3.1*    CBC:  Recent Labs Lab 04/21/14 0435 04/22/14 0448 04/23/14 0536  WBC 10.8* 20.5* 18.7*  HGB 10.6* 10.3* 9.3*  HCT 32.2* 32.1* 28.3*  MCV 96.4 97.0 96.6  PLT 135* 238 219   Coagulation:  Recent Labs Lab 04/24/14 0430  INR 1.17    Medications:   Scheduled Medications: . aspirin  81 mg Per Tube Daily  . cefTRIAXone (ROCEPHIN)  IV  2 g Intravenous Q24H  . famotidine (PEPCID) IV  20 mg Intravenous Q24H  . feeding supplement (GLUCERNA SHAKE)  237 mL Oral TID BM  . feeding supplement (PRO-STAT SUGAR FREE 64)  30 mL Oral BID  . folic acid  1 mg Per Tube Daily  . furosemide  40 mg Intravenous BID  . insulin aspart  0-15 Units Subcutaneous 6 times per day  . insulin aspart  3 Units Subcutaneous Q4H  . insulin detemir  70 Units Subcutaneous Q lunch  . metoprolol tartrate  25 mg Oral Q6H  . multivitamin with minerals  1 tablet Per Tube Daily  . sodium chloride  10-40  mL Intracatheter Q12H  . sodium chloride  10-40 mL Intracatheter Q12H  . sodium chloride  3 mL Intravenous Q12H  . thiamine  100 mg Oral Daily   Or  . thiamine  100 mg Intravenous Daily    Infusions: . heparin 1,100 Units/hr (04/24/14 0910)    PRN Medications: diphenhydrAMINE, LORazepam, metoprolol, naphazoline, ondansetron **OR** ondansetron (ZOFRAN) IV, sodium chloride, sodium chloride   Assessment and Plan:   1.Acute Systolic and Diastolic CHF: He has lost 10.047 liters since admission. Echo demonstrates EF of 20-25% elevated filling pressures, Breathing better but very deconditioned causing dyspnea with minimal exertion. Creatinine 1.77 this am. CO2 31. Potassium of 3.6. On lasix 40 mg BID. Would continue IV lasix for now. Not sure what baseline dry wt is. Will have to watch labs and CO2. May be able to transition to po tomorrow or the next day. Will discuss with Dr.Cris Gibby.  Will need  ischemic evaluation at some point once he is fully stable.   2. Atrial flutter: On heparin gtt. May need to consider starting po anticoagulation now that he is eating and more awake. CHADS VASC Score of 4. Heart rate is well controlled on metoprolol 25 mg QID.   3. ACE Induced Angioedema: Improved.   4. ETOH withdrawal: He is without tremors or seizure activity.   5.DVT: Continues on heparin gtt. Consider changing to eliquis  Gabriel MareKathryn M. Lawrence NP [facility]  04/24/2014, 10:24 AM   Patient seen and discussed with NP Lawerence, I agree with her documentation above. Regarding his acute systolic/diastolic HF, he is negative 1.1 liters yesterday and 10 liters since admission. He initially required inotropic support with dobutamine but has been succesfully weaned. He is currently on lasix 40mg IV bid, renal function remains stable and significantly improved from admission. Due to increasing Na will decrease lasix to 40mg  IV daily. He is on lopressor currently, if hemodynamics remain stable today will convert to long acting Toprol XL in the setting of systolic dysfunction. Would not pursue cath at this time given renal function, consider ischemic testing at later date. Other potential etiologies included EtOH CM. He had angioedema on ACE. Given his renal function and case reported cross reactivity with ARBs and angioedema will not start ARB. No aldactone given renal function. Pending hemodynamics can consider hydral/nitrates at later time. Regarding his aflutter he is off dilt gtt and rate controlled with lopressor. Some elevated rates at times, continue to follow on current dose. Limited options for his aflutter given his renal dysfunction, low LVEF, and abnormal liver tests. CHADS2Vasc score of 4, he remains on heparin gtt. Will require long term anticoag for both his DVTs and aflutter, will place pharmacy consult for assistance with dosing.    Dominga FerryJ Aby Gessel MD

## 2014-04-25 LAB — BASIC METABOLIC PANEL
Anion gap: 9 (ref 5–15)
BUN: 91 mg/dL — AB (ref 6–23)
CO2: 31 mmol/L (ref 19–32)
CREATININE: 1.53 mg/dL — AB (ref 0.50–1.35)
Calcium: 9.7 mg/dL (ref 8.4–10.5)
Chloride: 107 mmol/L (ref 96–112)
GFR calc Af Amer: 55 mL/min — ABNORMAL LOW (ref >90)
GFR calc non Af Amer: 47 mL/min — ABNORMAL LOW (ref >90)
Glucose, Bld: 68 mg/dL — ABNORMAL LOW (ref 70–99)
Potassium: 4.1 mmol/L (ref 3.5–5.1)
SODIUM: 147 mmol/L — AB (ref 135–145)

## 2014-04-25 LAB — GLUCOSE, CAPILLARY
GLUCOSE-CAPILLARY: 82 mg/dL (ref 70–99)
GLUCOSE-CAPILLARY: 57 mg/dL — AB (ref 70–99)
GLUCOSE-CAPILLARY: 123 mg/dL — AB (ref 70–99)
GLUCOSE-CAPILLARY: 48 mg/dL — AB (ref 70–99)
Glucose-Capillary: 68 mg/dL — ABNORMAL LOW (ref 70–99)
Glucose-Capillary: 127 mg/dL — ABNORMAL HIGH (ref 70–99)

## 2014-04-25 LAB — CBC
HCT: 27.8 % — ABNORMAL LOW (ref 39.0–52.0)
Hemoglobin: 9.3 g/dL — ABNORMAL LOW (ref 13.0–17.0)
MCH: 32.3 pg (ref 26.0–34.0)
MCHC: 33.5 g/dL (ref 30.0–36.0)
MCV: 96.5 fL (ref 78.0–100.0)
Platelets: 196 10*3/uL (ref 150–400)
RBC: 2.88 MIL/uL — ABNORMAL LOW (ref 4.22–5.81)
RDW: 13.2 % (ref 11.5–15.5)
WBC: 25 10*3/uL — ABNORMAL HIGH (ref 4.0–10.5)

## 2014-04-25 MED ORDER — TORSEMIDE 20 MG PO TABS
20.0000 mg | ORAL_TABLET | Freq: Two times a day (BID) | ORAL | Status: DC
  Administered 2014-04-25 – 2014-04-26 (×3): 20 mg via ORAL
  Filled 2014-04-25 (×3): qty 1

## 2014-04-25 MED ORDER — DEXTROSE 50 % IV SOLN
25.0000 mL | Freq: Once | INTRAVENOUS | Status: AC
  Administered 2014-04-25: 25 mL via INTRAVENOUS

## 2014-04-25 MED ORDER — DEXTROSE 50 % IV SOLN
INTRAVENOUS | Status: AC
  Filled 2014-04-25: qty 50

## 2014-04-25 MED ORDER — INSULIN DETEMIR 100 UNIT/ML ~~LOC~~ SOLN
55.0000 [IU] | Freq: Every day | SUBCUTANEOUS | Status: DC
  Filled 2014-04-25: qty 0.55

## 2014-04-25 MED ORDER — METOPROLOL SUCCINATE ER 50 MG PO TB24
50.0000 mg | ORAL_TABLET | Freq: Two times a day (BID) | ORAL | Status: DC
  Administered 2014-04-25 – 2014-04-26 (×2): 50 mg via ORAL
  Filled 2014-04-25 (×3): qty 1

## 2014-04-25 NOTE — Progress Notes (Signed)
Physical Therapy Treatment Patient Details Name: Gabriel Reynolds MRN: [id number] DOB: 01-16-1956 Today's Date: 04/25/2014    History of Present Illness With a hx of DM who presents with complaints of generalized edema and facial swelling in the setting of lisinopril. In the ED, the patient was noted to have marked LE edema with an enlarged heart seen on CXR. BNP was elevated at 617 with initial troponin of 0.09. The patient was also tachycardic with HR in the 120's, with patient receiving diltazem in ED. Cardiology was consulted and hospitalist was consulted for consideration for admission.    PT Comments    Pt was found supine in the bed, no c/o.  His RLE continues to be very edemetous and he is unable to move it on his own.  He was instructed in supine to sit needing max assist to move his RLE.  Sitting balance is good.  SARA lift was used to instruct and facilitate sit to stand.  He is able to assume full stance and hold it for 2 minutes x1 and then 20 seconds before fatiguing.  He then sat at EOB and was instructed in scooting laterally on the bed.  He was successful in moving his body about 15" however activity is very labored.  Wife was present during my visit.  I have asked CNA to assist pt to a recliner using the MAXI lift at some point today.  Follow Up Recommendations  SNF     Equipment Recommendations  Rolling walker with 5" wheels    Recommendations for Other Services  OT     Precautions / Restrictions Precautions Precautions: Fall Restrictions Weight Bearing Restrictions: No    Mobility  Bed Mobility Overal bed mobility: Needs Assistance Bed Mobility: Supine to Sit     Supine to sit: Max assist;HOB elevated     General bed mobility comments: pt needs full assist to move the RLE in the bed  Transfers Overall transfer level: Needs assistance Equipment used:  (SARA lift used to instruct/facilitate sit to stand) Transfers: Sit to/from Stand Sit to Stand: +2  safety/equipment;From elevated surface         General transfer comment: SARA lift used...pt able to stand 2 minutes, rested 5 minutes and then was able to stand 20 seconds before fatiguing out  Ambulation/Gait Ambulation/Gait assistance:  (unable)               Stairs            Wheelchair Mobility    Modified Rankin (Stroke Patients Only)       Balance                                    Cognition Arousal/Alertness: Awake/alert Behavior During Therapy: WFL for tasks assessed/performed Overall Cognitive Status: Within Functional Limits for tasks assessed                      Exercises      General Comments        Pertinent Vitals/Pain Pain Assessment: No/denies pain    Home Living                      Prior Function            PT Goals (current goals can now be found in the care plan section) Progress towards PT goals: Progressing toward goals  Frequency       PT Plan Current plan remains appropriate    Co-evaluation             End of Session Equipment Utilized During Treatment: Oxygen;Other (comment) (SARA lift) Activity Tolerance: Patient limited by fatigue Patient left: in bed;with call bell/phone within reach;with bed alarm set;with family/visitor present     Time: 4098-1191 PT Time Calculation (min) (ACUTE ONLY): 41 min  Charges:  $Therapeutic Activity: 38-52 mins                    G Codes:      Gabriel Reynolds May 22, 2014, 11:30 AM

## 2014-04-25 NOTE — Clinical Social Work Note (Signed)
Pt sleeping at time of CSW visit. [facility] notified. Awaiting stability for d/c.  Derenda FennelKara Delbert Darley, LCSW [phone]

## 2014-04-25 NOTE — Progress Notes (Signed)
UR chart review completed.  

## 2014-04-25 NOTE — Progress Notes (Signed)
Patient ID: Gabriel SeminoleRalph E Reynolds, male   DOB: [birth year], 59 y.o.   MRN: [id number]      Subjective:     No complaints Objective:   Temp:  [97.3 F (36.3 C)-98 F (36.7 C)] 97.3 F (36.3 C) (03/30 0500) Pulse Rate:  [70-107] 103 (03/30 0600) Resp:  [20-22] 22 (03/30 0500) BP: (133-153)/(82-106) 147/93 mmHg (03/30 0612) SpO2:  [95 %-100 %] 95 % (03/30 0500) Weight:  [280 lb 4.8 oz (127.143 kg)] 280 lb 4.8 oz (127.143 kg) (03/30 0500) Last BM Date: 04/23/14  Filed Weights   04/23/14 0500 04/24/14 0500 04/25/14 0500  Weight: 275 lb 12.7 oz (125.1 kg) 279 lb 15.8 oz (127 kg) 280 lb 4.8 oz (127.143 kg)    Intake/Output Summary (Last 24 hours) at 04/25/14 1058 Last data filed at 04/25/14 1017  Gross per 24 hour  Intake    560 ml  Output    375 ml  Net    185 ml    Telemetry:none  Exam:  General: NAD  Resp: crackles bilateral bases  Cardiac: irreg, no m/r/g, no JVD  WU:JWJXBJYGI:abdomen soft, NT, ND  MSK:1+ bilateral LE edema  Neuro: no focal deficits  Psych: appropriate affect  Lab Results:  Basic Metabolic Panel:  Recent Labs Lab 04/22/14 0859 04/23/14 0536 04/24/14 0426 04/25/14 0659  NA  --  143 146* 147*  K  --  3.8 3.6 4.1  CL  --  102 104 107  CO2  --  30 31 31   GLUCOSE  --  350* 95 68*  BUN  --  89* 96* 91*  CREATININE  --  1.77* 1.77* 1.53*  CALCIUM  --  9.0 9.6 9.7  MG 2.0  --   --   --     Liver Function Tests:  Recent Labs Lab 04/23/14 0600  AST 32  ALT 37  ALKPHOS 66  BILITOT 1.8*  PROT 6.4  ALBUMIN 3.1*    CBC:  Recent Labs Lab 04/22/14 0448 04/23/14 0536 04/25/14 0659  WBC 20.5* 18.7* 25.0*  HGB 10.3* 9.3* 9.3*  HCT 32.1* 28.3* 27.8*  MCV 97.0 96.6 96.5  PLT 238 219 196    Cardiac Enzymes: No results for input(s): CKTOTAL, CKMB, CKMBINDEX, TROPONINI in the last 168 hours.  BNP: No results for input(s): PROBNP in the last 8760 hours.  Coagulation:  Recent Labs Lab 04/24/14 0430  INR 1.17    ECG:   Medications:     Scheduled Medications: . apixaban  10 mg Oral BID   Followed by  . [START ON 05/01/2014] apixaban  5 mg Oral BID  . aspirin  81 mg Per Tube Daily  . cefTRIAXone (ROCEPHIN)  IV  2 g Intravenous Q24H  . famotidine (PEPCID) IV  20 mg Intravenous Q24H  . feeding supplement (GLUCERNA SHAKE)  237 mL Oral TID BM  . feeding supplement (PRO-STAT SUGAR FREE 64)  30 mL Oral BID  . folic acid  1 mg Per Tube Daily  . furosemide  40 mg Intravenous Daily  . insulin aspart  0-9 Units Subcutaneous TID WC  . insulin aspart  3 Units Subcutaneous TID WC  . insulin detemir  60 Units Subcutaneous Q lunch  . metoprolol tartrate  25 mg Oral Q6H  . multivitamin with minerals  1 tablet Per Tube Daily  . sodium chloride  10-40 mL Intracatheter Q12H  . sodium chloride  10-40 mL Intracatheter Q12H  . sodium chloride  3 mL Intravenous Q12H  .  thiamine  100 mg Oral Daily   Or  . thiamine  100 mg Intravenous Daily     Infusions:     PRN Medications:  diphenhydrAMINE, LORazepam, metoprolol, naphazoline, ondansetron **OR** ondansetron (ZOFRAN) IV, sodium chloride, sodium chloride     Assessment/Plan    1. Acute systolic/diastolic heart failure - LVEF 96-04%, restrictive diastolic function. Moderate RV dysfunction - +395 yesterday, negative 9.8 liters since admission. Cr and BUN trending down, Na trending up. He received lasix IV  x 1 yesterday - he is on lopressor  q 6 hrs. No ACE or ARB due to angioedema and poor renal function. No aldactone due to poor renal function. Will start oral torsemide today  bid.  - will change to Toprol XL 50 mg bid. Pending hemodynamics likely start hydral/nitrates tomorrow - repeat CXR  2. Aflutter - rate controlled with beta blocker. He is also on eliquis - may consider DCCV after 3-4 weeks of anticoag as outpatient  3. DVT - on anticoag with eliquis, continue        Dina Rich, M.D

## 2014-04-25 NOTE — Progress Notes (Signed)
Gabriel Reynolds  MRN: [id number]  DOB/AGE: 1955/06/25 59 y.o.  Primary Care Physician:Pcp Not In System  Admit date: 04/13/2014  Chief Complaint:  Chief Complaint  Patient presents with  . Facial Swelling  . Leg Swelling    S-Pt presented on  04/13/2014 with  Chief Complaint  Patient presents with  . Facial Swelling  . Leg Swelling  .    Pt  Offers no new complaints.   Meds  . apixaban  10 mg Oral BID   Followed by  . [START ON 05/01/2014] apixaban  5 mg Oral BID  . aspirin  81 mg Per Tube Daily  . cefTRIAXone (ROCEPHIN)  IV  2 g Intravenous Q24H  . famotidine (PEPCID) IV  20 mg Intravenous Q24H  . feeding supplement (GLUCERNA SHAKE)  237 mL Oral TID BM  . feeding supplement (PRO-STAT SUGAR FREE 64)  30 mL Oral BID  . folic acid  1 mg Per Tube Daily  . furosemide  40 mg Intravenous Daily  . insulin aspart  0-9 Units Subcutaneous TID WC  . insulin aspart  3 Units Subcutaneous TID WC  . insulin detemir  60 Units Subcutaneous Q lunch  . metoprolol tartrate  25 mg Oral Q6H  . multivitamin with minerals  1 tablet Per Tube Daily  . sodium chloride  10-40 mL Intracatheter Q12H  . sodium chloride  10-40 mL Intracatheter Q12H  . sodium chloride  3 mL Intravenous Q12H  . thiamine  100 mg Oral Daily   Or  . thiamine  100 mg Intravenous Daily      Physical Exam: Vital signs in last 24 hours: Temp:  [97.3 F (36.3 C)-98 F (36.7 C)] 97.3 F (36.3 C) (03/30 0500) Pulse Rate:  [70-107] 103 (03/30 0600) Resp:  [20-22] 22 (03/30 0500) BP: (133-153)/(82-106) 147/93 mmHg (03/30 0612) SpO2:  [95 %-100 %] 95 % (03/30 0500) Weight:  [280 lb 4.8 oz (127.143 kg)] 280 lb 4.8 oz (127.143 kg) (03/30 0500) Weight change: 5.1 oz (0.143 kg) Last BM Date: 04/23/14  Intake/Output from previous day: 03/29 0701 - 03/30 0700 In: 770 [P.O.:720; IV Piggyback:50] Out: 375 [Urine:375]     Physical Exam: General- awake,alert,responds to commands Resp-NO acute rest distress, Rhonchi +  minimal ( much better) CVS- S1S2 regular in rate and tachycardiac GIT- BS+, soft, NT, ND EXT- 1+ LE Edema, No Cyanosis   Lab Results: CBC  Recent Labs  04/23/14 0536 04/25/14 0659  WBC 18.7* 25.0*  HGB 9.3* 9.3*  HCT 28.3* 27.8*  PLT 219 196    BMET  Recent Labs  04/24/14 0426 04/25/14 0659  NA 146* 147*  K 3.6 4.1  CL 104 107  CO2 31 31  GLUCOSE 95 68*  BUN 96* 91*  CREATININE 1.77* 1.53*  CALCIUM 9.6 9.7   Creat trend 2016 1.98=> 4.25-4.26=>3.46=>2.17=>1.95=>1.79=>1.53 2009  1.4   MICRO Recent Results (from the past 240 hour(s))  Culture, blood (x 2)     Status: None   Collection Time: 04/16/14 10:06 AM  Result Value Ref Range Status   Specimen Description BLOOD PORTA CATH DRAWN BY RN  Final   Special Requests BOTTLES DRAWN AEROBIC AND ANAEROBIC 6CC  Final   Culture   Final    VIRIDANS STREPTOCOCCUS Note: Gram Stain Report Called to,Read Back By and Verified With: MCDANIEL M. AT 0700 BY [email or web address] Performed at [facility] Performed at [facility]    Report Status 04/19/2014 FINAL  Final   Organism  ID, Bacteria VIRIDANS STREPTOCOCCUS  Final      Susceptibility   Viridans streptococcus - MIC (ETEST)*    PENICILLIN .064 SENSITIVE Sensitive     * VIRIDANS STREPTOCOCCUS  Culture, blood (x 2)     Status: None   Collection Time: 04/16/14 10:16 AM  Result Value Ref Range Status   Specimen Description BLOOD RIGHT ANTECUBITAL  Final   Special Requests BOTTLES DRAWN AEROBIC AND ANAEROBIC 10CC  Final   Culture   Final    VIRIDANS STREPTOCOCCUS Note: SUSCEPTIBILITIES PERFORMED ON PREVIOUS CULTURE WITHIN THE LAST 5 DAYS. Note: Gram Stain Report Called to,Read Back By and Verified With: MISTY MCDANIEL RN 04/17/14 0700 BY RESSEGGER R Performed at [facility] Performed at [facility]    Report Status 04/19/2014 FINAL  Final      Lab Results  Component Value Date   CALCIUM 9.7 04/25/2014   CAION 1.21 11/13/2007         Impression: 1)Renal  AKI secondary to Prerenal/ hepatorenal/ATN/Cardiorenal/ACE               AKi much better              Creat at baseline               AKI on CKD ( Most likley )               CKD stage not sure as last creat was 1.4 from 2009.               CKD since 2009 ( most likley , not much data available)               CKD secondary to Cardiorenal               Progression of CKD now marked with AKI               2)CVS- hemodynamiclly stable  3)Anemia HGb at goal (9--11)   4)Resp- admitted with resp failure/pneumonia  now intubated On IV abx  5)CHF- Alcoholic Cardiomyopathy  Primary MD and Cardiology following  6)Electrolytes Hypokalemic Now better  Hypernatremic   7)Acid base Co2 at goal   8) DVT- on Iv Heprain  9) ID- Blood Cultures positive  on ceftriaxone  10)CHF- admitted with CHF                Clinically much better   Plan:  Will suggest to increase free water intake      Rollo Farquhar S 04/25/2014, 9:05 AM

## 2014-04-25 NOTE — Clinical Social Work Placement (Signed)
Clinical Social Work Department CLINICAL SOCIAL WORK PLACEMENT NOTE 04/25/2014  Patient:  Gabriel SeminoleDAMS,Deandre E  Account Number:  [id number] Admit date:  04/13/2014  Clinical Social Worker:  Derenda FennelKARA Sharlisa Hollifield, LCSW  Date/time:  04/24/2014 03:54 PM  Clinical Social Work is seeking post-discharge placement for this patient at the following level of care:   SKILLED NURSING   (*CSW will update this form in Epic as items are completed)   04/24/2014  Patient/family provided with [facility] Department of Clinical Social Work's list of facilities offering this level of care within the geographic area requested by the patient (or if unable, by the patient's family).  04/24/2014  Patient/family informed of their freedom to choose among providers that offer the needed level of care, that participate in Medicare, Medicaid or managed care program needed by the patient, have an available bed and are willing to accept the patient.  04/24/2014  Patient/family informed of [facility]' ownership interest in [facility], as well as of the fact that they are under no obligation to receive care at this facility.  PASARR submitted to EDS on 04/24/2014 PASARR number received on 04/24/2014  FL2 transmitted to all facilities in geographic area requested by pt/family on  04/24/2014 FL2 transmitted to all facilities within larger geographic area on   Patient informed that his/her managed care company has contracts with or will negotiate with  certain facilities, including the following:     Patient/family informed of bed offers received:  04/25/2014 Patient chooses bed at [facility] Physician recommends and patient chooses bed at    Patient to be transferred to  on   Patient to be transferred to facility by  Patient and family notified of transfer on  Name of family member notified:    The following physician request were entered in Epic:   Additional Comments:  Derenda FennelKara Jameyah Fennewald, LCSW [phone]

## 2014-04-25 NOTE — Progress Notes (Signed)
Inpatient Diabetes Program Recommendations  AACE/ADA: New Consensus Statement on Inpatient Glycemic Control (2013)  Target Ranges:  Prepandial:   less than 140 mg/dL      Peak postprandial:   less than 180 mg/dL (1-2 hours)      Critically ill patients:  140 - 180 mg/dL   Results for Gabriel Reynolds, Gabriel Reynolds (MRN [id number]) as of 04/25/2014 08:15  Ref. Range 04/24/2014 07:40 04/24/2014 11:10 04/24/2014 16:28 04/24/2014 20:45 04/25/2014 07:48  Glucose-Capillary Latest Range: 70-99 mg/dL 409133 (H) 811165 (H) 914204 (H) 160 (H) 68 (L)   Current orders for Inpatient glycemic control: Levemir 60 units daily with lunch, Novolog 0-9 units TID, Novolog 3 units TID with meals  Inpatient Diabetes Program Recommendations Insulin - Basal: Fasting glucose 68 mg/dl. Please consider decreasing Levemir to 55 units daily.   Thanks, Orlando PennerMarie Isidor Bromell, RN, MSN, CCRN, CDE Diabetes Coordinator Inpatient Diabetes Program [phone] (Team Pager from 8am to 5pm) [phone] ([facility] office) [phone] [facility] office)

## 2014-04-25 NOTE — Progress Notes (Signed)
PROGRESS NOTE  Gabriel Reynolds NWG:[id number] DOB: [birth year] DOA: 04/13/2014 PCP: Pcp Not In System  Assessment/Plan: 1. Acute hypoxic respiratory failure. 2. AKI with anasarca, slowly improving. Management per nephrology. 3. Acute systolic/diastolic CHF, biventricular failure. Weaned off dobutamine infusion 3/24. LVEF 20-25%. Slowly improving. 4. Atrial flutter. Rate controlled with beta blocker. On Eliquis. 5. Sepsis secondary to Streptococcus viridans bacteremia. Source possible pneumonia. Dentition suspected. 6. VDRF multifactorial: CHF, angioedema, DT. Self extubated 3/24 requiring reintubation. Self extubated again 3/26, did not require reintubation. 7. Normocytic anemia, stable. 8. Alcohol abuse. DTs suspected, now resolved. 9. Right lower extremity DVT. Eliquis. 10. Angioedema. Secondary to ACE inhibitor. Slowly improving. 11. Cirrhosis   Overall slowly improving.   Oral diuretics and cardiac medications per cardiology   Continue ceftriaxone. Will discuss with ID.  Decrease Levemir to 55 units daily  Wean oxygen  Possible transfer to SNF next 2-3 days.  Code Status: full code DVT prophylaxis: Eliquis Family Communication: none present Disposition Plan: SNF  Brendia Sacks, MD  [facility]  Pager [phone] If 7PM-7AM, please contact night-coverage at [email or web address], password [facility] 04/25/2014, 2:39 PM  LOS: 12 days   Consultants:  Nephrology  Pulmonology  Cardiology  ST: Dysphagia 3 (Mechanical Soft);Thin liquid   PT: SNF  Procedures:    Antibiotics:  Ceftriaxone   HPI/Subjective: Feels ok, still has RLE, some tongue edema. Breathing ok. Eating ok.  Objective: Filed Vitals:   04/25/14 0000 04/25/14 0500 04/25/14 0600 04/25/14 0612  BP: 135/86 153/106 150/99 147/93  Pulse: 70 103 103   Temp:  97.3 F (36.3 C)    TempSrc:  Axillary    Resp:  22    Height:      Weight:  127.143 kg (280 lb 4.8 oz)    SpO2:  95%      Intake/Output  Summary (Last 24 hours) at 04/25/14 1439 Last data filed at 04/25/14 1017  Gross per 24 hour  Intake    560 ml  Output    375 ml  Net    185 ml     Filed Weights   04/23/14 0500 04/24/14 0500 04/25/14 0500  Weight: 125.1 kg (275 lb 12.7 oz) 127 kg (279 lb 15.8 oz) 127.143 kg (280 lb 4.8 oz)    Exam:     Afebrile, vital signs are stable. Stable oxygenation on 3 L. General: Appears calm and comfortable ENT: grossly normal hearing, lips & tongue Cardiovascular: irregular, normal rate, no m/r/g. RLE edema 3+. No LLE edema. Telemetry: atrial flutter  Respiratory: CTA bilaterally, no w/r/r. Normal respiratory effort. Musculoskeletal: grossly normal tone BUE/BLE Psychiatric: grossly normal mood and affect, speech fluent and appropriate Neurologic: grossly non-focal.  Data Reviewed:  Capillary blood sugars notable for hypoglycemia this morning, 68  BUN 91, creatinine 1.53, slowly trending down  Sodium 147, stable  WBC 25  Hemoglobin stable 9.3  Pending data:    Scheduled Meds: . apixaban  10 mg Oral BID   Followed by  . [START ON 05/01/2014] apixaban  5 mg Oral BID  . aspirin  81 mg Per Tube Daily  . cefTRIAXone (ROCEPHIN)  IV  2 g Intravenous Q24H  . famotidine (PEPCID) IV  20 mg Intravenous Q24H  . feeding supplement (GLUCERNA SHAKE)  237 mL Oral TID BM  . feeding supplement (PRO-STAT SUGAR FREE 64)  30 mL Oral BID  . folic acid  1 mg Per Tube Daily  . furosemide  40 mg Intravenous Daily  . insulin aspart  0-9  Units Subcutaneous TID WC  . insulin aspart  3 Units Subcutaneous TID WC  . insulin detemir  60 Units Subcutaneous Q lunch  . metoprolol succinate  50 mg Oral BID  . multivitamin with minerals  1 tablet Per Tube Daily  . sodium chloride  10-40 mL Intracatheter Q12H  . sodium chloride  10-40 mL Intracatheter Q12H  . sodium chloride  3 mL Intravenous Q12H  . thiamine  100 mg Oral Daily   Or  . thiamine  100 mg Intravenous Daily  . torsemide  20 mg Oral BID    Continuous Infusions:   Principal Problem:   Acute respiratory failure with hypoxemia Active Problems:   Diabetes type 2, controlled   Angioedema   Acute combined systolic and diastolic CHF, NYHA class 4   Delirium tremens   DVT (deep venous thrombosis)   Ventilator dependence   Severe sepsis   Time spent 35 minutes

## 2014-04-26 ENCOUNTER — Inpatient Hospital Stay (HOSPITAL_COMMUNITY): Payer: Medicare Other

## 2014-04-26 DIAGNOSIS — I82401 Acute embolism and thrombosis of unspecified deep veins of right lower extremity: Secondary | ICD-10-CM

## 2014-04-26 LAB — CBC
HEMATOCRIT: 25.8 % — AB (ref 39.0–52.0)
Hemoglobin: 8.7 g/dL — ABNORMAL LOW (ref 13.0–17.0)
MCH: 32.6 pg (ref 26.0–34.0)
MCHC: 33.7 g/dL (ref 30.0–36.0)
MCV: 96.6 fL (ref 78.0–100.0)
Platelets: 180 10*3/uL (ref 150–400)
RBC: 2.67 MIL/uL — ABNORMAL LOW (ref 4.22–5.81)
RDW: 13.5 % (ref 11.5–15.5)
WBC: 18 10*3/uL — AB (ref 4.0–10.5)

## 2014-04-26 LAB — BASIC METABOLIC PANEL
ANION GAP: 8 (ref 5–15)
BUN: 75 mg/dL — ABNORMAL HIGH (ref 6–23)
CHLORIDE: 102 mmol/L (ref 96–112)
CO2: 32 mmol/L (ref 19–32)
Calcium: 8.9 mg/dL (ref 8.4–10.5)
Creatinine, Ser: 1.55 mg/dL — ABNORMAL HIGH (ref 0.50–1.35)
GFR calc Af Amer: 54 mL/min — ABNORMAL LOW (ref >90)
GFR calc non Af Amer: 47 mL/min — ABNORMAL LOW (ref >90)
Glucose, Bld: 112 mg/dL — ABNORMAL HIGH (ref 70–99)
POTASSIUM: 3.7 mmol/L (ref 3.5–5.1)
SODIUM: 142 mmol/L (ref 135–145)

## 2014-04-26 LAB — GLUCOSE, CAPILLARY
GLUCOSE-CAPILLARY: 155 mg/dL — AB (ref 70–99)
GLUCOSE-CAPILLARY: 153 mg/dL — AB (ref 70–99)
Glucose-Capillary: 36 mg/dL — CL (ref 70–99)
Glucose-Capillary: 62 mg/dL — ABNORMAL LOW (ref 70–99)
Glucose-Capillary: 96 mg/dL (ref 70–99)

## 2014-04-26 MED ORDER — METOPROLOL SUCCINATE ER 50 MG PO TB24
75.0000 mg | ORAL_TABLET | Freq: Two times a day (BID) | ORAL | Status: DC
  Administered 2014-04-26: 75 mg via ORAL
  Filled 2014-04-26 (×2): qty 1

## 2014-04-26 MED ORDER — ISOSORBIDE DINITRATE 5 MG PO TABS
5.0000 mg | ORAL_TABLET | Freq: Three times a day (TID) | ORAL | Status: DC
  Filled 2014-04-26 (×4): qty 1

## 2014-04-26 MED ORDER — HYDRALAZINE HCL 10 MG PO TABS: 10.0000 mg | ORAL_TABLET | Freq: Three times a day (TID) | ORAL | Status: DC

## 2014-04-26 MED ORDER — DEXTROSE 50 % IV SOLN
25.0000 mL | Freq: Once | INTRAVENOUS | Status: AC
  Administered 2014-04-26: 25 mL via INTRAVENOUS

## 2014-04-26 MED ORDER — INSULIN DETEMIR 100 UNIT/ML ~~LOC~~ SOLN
20.0000 [IU] | Freq: Every day | SUBCUTANEOUS | Status: DC
Start: 2014-04-26 — End: 2014-04-28
  Administered 2014-04-27: 20 [IU] via SUBCUTANEOUS
  Filled 2014-04-26 (×5): qty 0.2

## 2014-04-26 MED ORDER — DEXTROSE 5 % IV SOLN
INTRAVENOUS | Status: DC
  Administered 2014-04-26: 13:00:00 via INTRAVENOUS

## 2014-04-26 MED ORDER — METOPROLOL SUCCINATE ER 25 MG PO TB24
25.0000 mg | ORAL_TABLET | Freq: Once | ORAL | Status: AC
  Administered 2014-04-26: 25 mg via ORAL
  Filled 2014-04-26: qty 1

## 2014-04-26 MED ORDER — METOPROLOL TARTRATE 1 MG/ML IV SOLN
5.0000 mg | INTRAVENOUS | Status: DC | PRN
  Administered 2014-04-26: 5 mg via INTRAVENOUS
  Filled 2014-04-26 (×2): qty 5

## 2014-04-26 NOTE — Progress Notes (Signed)
Physical Therapy Treatment Patient Details Name: Gabriel SeminoleRalph E Spink MRN: [id number] DOB: 24-Aug-1955 Today's Date: 04/26/2014    History of Present Illness With a hx of DM who presents with complaints of generalized edema and facial swelling in the setting of lisinopril. In the ED, the patient was noted to have marked LE edema with an enlarged heart seen on CXR. BNP was elevated at 617 with initial troponin of 0.09. The patient was also tachycardic with HR in the 120's, with patient receiving diltazem in ED. Cardiology was consulted and hospitalist was consulted for consideration for admission.    PT Comments    Pt is reporting feeling better in general.  The edema in his RLE appears to be decreased overall.  He is now able to stand within the SARA lift using the platform under his feet.  This will enable to staff to transfer him to the chair more easily.  He was able to stand 2/5 minutes with good stability but fatigued.  He was transferred to a chair with the lift and then able to do a few seated LE strengthening exercises.  Follow Up Recommendations  SNF     Equipment Recommendations    none   Recommendations for Other Services  none     Precautions / Restrictions Precautions Precautions: Fall Restrictions Weight Bearing Restrictions: No    Mobility  Bed Mobility Overal bed mobility: Needs Assistance Bed Mobility: Supine to Sit     Supine to sit: Max assist;HOB elevated     General bed mobility comments: pt needs full assist to move the RLE in the bed  Transfers Overall transfer level: Needs assistance Equipment used:  (SARA lift used using platform under his feet) Transfers: Sit to/from Stand Sit to Stand: +2 safety/equipment         General transfer comment: able to stand 2..5 minutes with SARA lift with good stability but very fatigued  Ambulation/Gait Ambulation/Gait assistance:  (unable)               Stairs            Wheelchair Mobility     Modified Rankin (Stroke Patients Only)       Balance Overall balance assessment: Needs assistance Sitting-balance support: No upper extremity supported;Feet supported Sitting balance-Leahy Scale: Fair Sitting balance - Comments: sitting balance is somewhat limited by his large abdomen                            Cognition Arousal/Alertness: Awake/alert Behavior During Therapy: WFL for tasks assessed/performed Overall Cognitive Status: Within Functional Limits for tasks assessed                      Exercises General Exercises - Lower Extremity Long Arc Quad: AROM;Both;10 reps;Seated Hip Flexion/Marching: AAROM;Both;10 reps;Seated    General Comments        Pertinent Vitals/Pain Pain Assessment: No/denies pain    Home Living                      Prior Function            PT Goals (current goals can now be found in the care plan section) Progress towards PT goals: Progressing toward goals    Frequency  Min 3X/week    PT Plan Current plan remains appropriate    Co-evaluation             End of Session  Equipment Utilized During Treatment: Oxygen;Other (comment) (SARA lift) Activity Tolerance: Patient limited by fatigue Patient left: in bed;with call bell/phone within reach;with bed alarm set;with family/visitor present     Time: 1610-9604 PT Time Calculation (min) (ACUTE ONLY): 30 min  Charges:  $Therapeutic Activity: 23-37 mins                    G Codes:      Konrad Penta 12-May-2014, 12:04 PM

## 2014-04-26 NOTE — Progress Notes (Signed)
PROGRESS NOTE  [name] GNF:[id number] DOB: 08-Jul-1955 DOA: 04/13/2014 [place]CP[place] In System  [place]  Summary: 59 year old man with history of diabetes presented with significant lower extremity edema and facial swelling while on lisinopril. Workup in the emergency department revealed an enlarged heart on chest x-ray, elevated BNP, modestly elevated troponin and tachycardia. Admitted for suspected CHF, cardiology consultation, angioedema, tachycardia. He was seen in consultation with cardiology and started on diuretics and treated for angioedema. Echocardiogram revealed severe systolic and diastolic dysfunction as well as right ventricular dysfunction. The patient developed symptoms suggestive of alcohol withdrawal/DTs and was electively intubated for airway protection. He developed acute renal failure and required dobutamine. Developed atrial flutter. Found to have right lower extremity DVT and started on heparin. Found to have bacteremia and given difficult intubation there was concern for aspiration pneumonia and he was started on antibiotics. Renal function slowly improved. Patient self extubated 3/24 and was reintubated. Angioedema persisted and was treated with FFP. Self extubated 3/26. Heart rate control was difficult. He was transitioned to oral anticoagulants.   Assessment/Plan: 1. Acute hypoxic respiratory failure. Resolving. 2. AKI, overall improving, no change in creatinine but BUN improving . Management per nephrology. Prerenal vs hepatorenal vs cardiorenal vs ATN. 3. Acute systolic/diastolic CHF, biventricular failure. High suspicion for alcoholic cardiomyopathy. Weaned off dobutamine infusion 3/24. LVEF 20-25%. Improving.  4. Atrial fibrillation, flutterate controlled difficult, beta blocker adjusted by cardiology. Eliquis. 5. Sepsis secondary to Streptococcus viridans bacteremia. Source possible pneumonia but suspect dentition. Pneumonia or aspiration pneumonia with sepsis was  considered. WBC improving, likely somewhat elevated because of steroids. 6. VDRF multifactorial, resolved: CHF, angioedema, DTs. Self extubated 3/24 requiring reintubation. Self extubated again 3/26, did not require reintubation. 7. Normocytic anemia, stable. 8. Alcohol abuse. DTs suspected,  resolved. Treated with Precedex.  9. Right lower extremity DVT, possible PE. Eliquis. Cannot rule out PE given right ventricular systolic dysfunction noted on echo 3/19.  Hemodynamics are stable and further investigation would not change management at this point.  10. Angioedema. Secondary to ACE inhibitor. Improving. 11. Cirrhosis, Suspect alcoholic in nature. Hepatitis panel was negative.  12. Obesity    Continue management per cardiology, no ACE-I or ARB secondary to angioedema and poor renal function. No Aldactone secondary to poor renal function. Toprol increased. Ischemic testing will be deferred to the outpatient setting. Cardioversion will be considered after 3-4 weeks of anticoagulation.   Follow-up repeat chest x-ray--no acute abnormality.  Wean oxygen  Compression hose RLE   possible discharge to skilled nursing facility in the next 48 hours depending on heart rate control and overall stability.  Code Status: full code DVT prophylaxis: Eliquis Family Communication: none present Disposition Plan: SNF  Gabriel Sacksaniel Kailoni Vahle, MD  [facility]  Pager [phone] If 7PM-7AM, please contact night-coverage at [email or web address], password [email or web address] 04/26/2014, 10:42 AM  LOS: 13 days   Consultants:  Nephrology  Pulmonology--signed off 3/29  Cardiology  ST: Dysphagia 3 (Mechanical Soft);Thin liquid   PT: SNF  Procedures:  ETT 3/20 >>  CVC 3/20 >>  PICC  3/25, 3/26 FFP  Antibiotics:  Zosyn 3/21>>> 3/24  Vancomycin 3/22>>>3/24  Ceftriaxone 3/24 >>  HPI/Subjective: Elevated heart rates, cardiology is increased Toprol-XL.  nephrology is started D5 water for hypernatremia.   Overall  doing better today.  Objective: Filed Vitals:   04/25/14 2102 04/26/14 0533 04/26/14 0720 04/26/14 0838  BP: 142/95 124/84    Pulse: 76 60  118  Temp: 97.5 F (36.4 C) 97.5 F (36.4 C)  TempSrc: Oral Oral    Resp: 20 20    Height:      Weight:      SpO2: 100% 100% 99%     Intake/Output Summary (Last 24 hours) at 04/26/14 1042 Last data filed at 04/26/14 0924  Gross per 24 hour  Intake   1010 ml  Output   1000 ml  Net     10 ml     Filed Weights   04/23/14 0500 04/24/14 0500 04/25/14 0500  Weight: 125.1 kg (275 lb 12.7 oz) 127 kg (279 lb 15.8 oz) 127.143 kg (280 lb 4.8 oz)    Exam:     Afebrile, vital signs are stable. Stable oxygenation on 1 L. General:  Appears calm and comfortable Cardiovascular:  tachycardic, irregular m/r/g. No LLE edema. Decreased right lower extremity edema. Telemetry: Atrial flutter, heart rate 120s Respiratory: CTA bilaterally, no w/r/r. Normal respiratory effort. Musculoskeletal: grossly normal tone BUE/BLE Psychiatric: grossly normal mood and affect, speech fluent and appropriate  Data Reviewed:  UOP 1000  I/O -150; -9.8 L since admission  Capillary blood sugars low   WBC 25 >>18 (steroids?) bumped between 3/26 and 3/27  Hemoglobin stable 8.7  CXR no acute disease  Hepatitis panel negative  BC 2/2 Strep viridans, sensitive to PCN  Pending data:    Scheduled Meds: . apixaban  10 mg Oral BID   Followed by  . [START ON 05/01/2014] apixaban  5 mg Oral BID  . cefTRIAXone (ROCEPHIN)  IV  2 g Intravenous Q24H  . [COMPLETED] dextrose      . feeding supplement (GLUCERNA SHAKE)  237 mL Oral TID BM  . feeding supplement (PRO-STAT SUGAR FREE 64)  30 mL Oral BID  . folic acid  1 mg Per Tube Daily  . insulin aspart  0-9 Units Subcutaneous TID WC  . insulin detemir  20 Units Subcutaneous Q lunch  . metoprolol succinate  25 mg Oral Once  . metoprolol succinate  75 mg Oral BID  . multivitamin with minerals  1 tablet Per Tube Daily   . sodium chloride  10-40 mL Intracatheter Q12H  . sodium chloride  10-40 mL Intracatheter Q12H  . sodium chloride  3 mL Intravenous Q12H  . thiamine  100 mg Oral Daily   Or  . thiamine  100 mg Intravenous Daily  . torsemide  20 mg Oral BID   Continuous Infusions: . dextrose      Principal Problem:   Acute respiratory failure with hypoxemia Active Problems:   Diabetes type 2, controlled   Angioedema   Acute combined systolic and diastolic CHF, NYHA class 4   Delirium tremens   DVT (deep venous thrombosis)   Ventilator dependence   Severe sepsis   Time spent 40 minutes, greater than 50% in counseling and coordination of care

## 2014-04-26 NOTE — Progress Notes (Signed)
Pt's heart rate staying in the 130's. MD paged and new orders given for Lopressor prn.

## 2014-04-26 NOTE — Progress Notes (Signed)
Subjective: Interval History: Patient offers no complaint this morning. He doesn't have any nausea or vomiting. Abdominal pain is daily basis.  Objective: Vital signs in last 24 hours: Temp:  [97.5 F (36.4 C)-97.6 F (36.4 C)] 97.5 F (36.4 C) (03/31 0533) Pulse Rate:  [60-118] 118 (03/31 0838) Resp:  [20] 20 (03/31 0533) BP: (124-142)/(84-95) 124/84 mmHg (03/31 0533) SpO2:  [96 %-100 %] 99 % (03/31 0720) Weight change:   Intake/Output from previous day: 03/30 0701 - 03/31 0700 In: 850 [P.O.:720; I.V.:30; IV Piggyback:100] Out: 1000 [Urine:1000] Intake/Output this shift: Total I/O In: 480 [P.O.:480] Out: -    generally patient is alert and in no apparent distress. Chest: Decreased breath sounds bilaterally. Heart exam revealed regular rate and rhythm Abdomen: Distended but nontender and positive bowel sound. Extremities: He has no edema on the left but he has trace edema on the right.    Lab Results:  Recent Labs  04/25/14 0659  WBC 25.0*  HGB 9.3*  HCT 27.8*  PLT 196   BMET:   Recent Labs  04/24/14 0426 04/25/14 0659  NA 146* 147*  K 3.6 4.1  CL 104 107  CO2 31 31  GLUCOSE 95 68*  BUN 96* 91*  CREATININE 1.77* 1.53*  CALCIUM 9.6 9.7   No results for input(s): PTH in the last 72 hours. Iron Studies: No results for input(s): IRON, TIBC, TRANSFERRIN, FERRITIN in the last 72 hours.  Studies/Results: Dg Chest Port 1 View  04/26/2014   CLINICAL DATA:  Shortness of breath and edema  EXAM: PORTABLE CHEST - 1 VIEW  COMPARISON:  04/22/2014  FINDINGS: Cardiac shadow is mildly enlarged but stable. A left PICC line is again seen in the mid superior vena cava. No focal infiltrate or sizable effusion is seen. No bony abnormality is noted.  IMPRESSION: No acute abnormality seen.   Electronically Signed   By: Alcide CleverMark  Lukens M.D.   On: 04/26/2014 09:47    I have reviewed the patient's current medications.  Assessment/Plan: Problem #1 acute kidney injury: His renal  function continue to improve . His BUN is 91 and creatinine is 1.53. Slowly coming down. Problem #3 liver cirrhosis: Patient still with abdominal distention but he doesn't have significant edema. Problem #4 severe combined systolic and diastolic dysfunction Problem #5 respiratory failure: Patient denies any difficulty breathing. Problem #6  sepsis: patient afebrile however his white blood cell  count is still high.  Problem #7 angioedema: Has resolved Problem #8 hypokalemia: His potassium has corrected  Problem #9 hypernatremia: His sodium started increasing. Possibly from lack of water. Plan: We'll continue with present treatment We'll start patient on D5 water at 50 mL per hour Encouraged patient to increase his intake of free water. We'll check his basic metabolic panel in the morning.   LOS: 13 days   Srijan Givan S 04/26/2014,10:19 AM

## 2014-04-26 NOTE — Progress Notes (Signed)
Patient ID: Geri SeminoleRalph E Ellner, male   DOB: 02-28-1955, 59 y.o.   MRN: [id number]    Subjective:    No complaints this AM  Objective:   Temp:  [97.5 F (36.4 C)-97.6 F (36.4 C)] 97.5 F (36.4 C) (03/31 0533) Pulse Rate:  [60-118] 118 (03/31 0838) Resp:  [20] 20 (03/31 0533) BP: (124-142)/(84-95) 124/84 mmHg (03/31 0533) SpO2:  [96 %-100 %] 99 % (03/31 0720) Last BM Date: 04/25/14  Filed Weights   04/23/14 0500 04/24/14 0500 04/25/14 0500  Weight: 275 lb 12.7 oz (125.1 kg) 279 lb 15.8 oz (127 kg) 280 lb 4.8 oz (127.143 kg)    Intake/Output Summary (Last 24 hours) at 04/26/14 0857 Last data filed at 04/26/14 [id number]  Gross per 24 hour  Intake    850 ml  Output   1000 ml  Net   -150 ml    Telemetry: aflutter, irregular  Exam:  General: NAD  Resp: CTAB  Cardiac: irreg, no m/r/g, no JVD  GI: abdomen soft, NT, ND  MSK: 2+ LLE edema, no RLE edema.   Neuro: no focal deficits   Lab Results:  Basic Metabolic Panel:  Recent Labs Lab 04/22/14 0859 04/23/14 0536 04/24/14 0426 04/25/14 0659  NA  --  143 146* 147*  K  --  3.8 3.6 4.1  CL  --  102 104 107  CO2  --  30 31 31   GLUCOSE  --  350* 95 68*  BUN  --  89* 96* 91*  CREATININE  --  1.77* 1.77* 1.53*  CALCIUM  --  9.0 9.6 9.7  MG 2.0  --   --   --     Liver Function Tests:  Recent Labs Lab 04/23/14 0600  AST 32  ALT 37  ALKPHOS 66  BILITOT 1.8*  PROT 6.4  ALBUMIN 3.1*    CBC:  Recent Labs Lab 04/22/14 0448 04/23/14 0536 04/25/14 0659  WBC 20.5* 18.7* 25.0*  HGB 10.3* 9.3* 9.3*  HCT 32.1* 28.3* 27.8*  MCV 97.0 96.6 96.5  PLT 238 219 196    Cardiac Enzymes: No results for input(s): CKTOTAL, CKMB, CKMBINDEX, TROPONINI in the last 168 hours.  BNP: No results for input(s): PROBNP in the last 8760 hours.  Coagulation:  Recent Labs Lab 04/24/14 0430  INR 1.17    ECG:   Medications:   Scheduled Medications: . apixaban  10 mg Oral BID   Followed by  . [START ON 05/01/2014]  apixaban  5 mg Oral BID  . aspirin  81 mg Per Tube Daily  . cefTRIAXone (ROCEPHIN)  IV  2 g Intravenous Q24H  . [COMPLETED] dextrose      . feeding supplement (GLUCERNA SHAKE)  237 mL Oral TID BM  . feeding supplement (PRO-STAT SUGAR FREE 64)  30 mL Oral BID  . folic acid  1 mg Per Tube Daily  . furosemide  40 mg Intravenous Daily  . insulin aspart  0-9 Units Subcutaneous TID WC  . insulin detemir  20 Units Subcutaneous Q lunch  . metoprolol succinate  50 mg Oral BID  . multivitamin with minerals  1 tablet Per Tube Daily  . sodium chloride  10-40 mL Intracatheter Q12H  . sodium chloride  10-40 mL Intracatheter Q12H  . sodium chloride  3 mL Intravenous Q12H  . thiamine  100 mg Oral Daily   Or  . thiamine  100 mg Intravenous Daily  . torsemide  20 mg Oral BID  Infusions:     PRN Medications:  metoprolol, naphazoline, ondansetron **OR** ondansetron (ZOFRAN) IV, sodium chloride, sodium chloride     Assessment/Plan   1. Acute systolic/diastolic heart failure - LVEF 16-10%, restrictive diastolic function. Moderate RV dysfunction - negative 150 mL yesterday, negative 9.8 liters since admission. Labs pending this AM, Cr and BUN had been trending down, Na trending up. Started on oral torsemide  bid yesterday - on Toprol XL  bid.  No ACE or ARB due to angioedema and poor renal function. No aldactone due to poor renal function. Consider hydral/nitrates, however would first increase Toprol XL to  bid due to elevated heart rates.  - repeat CXR today - will consider ischemic testing as outpatient, his Cr likely will prohibit a cath.   2. Aflutter - elevated rates, will increase Toprol XL to  bid.  He is on eliquis - may consider DCCV after 3-4 weeks of anticoag as outpatient  3. DVT - on anticoag with eliquis, continue        Dina Rich, M.D.

## 2014-04-27 DIAGNOSIS — I471 Supraventricular tachycardia: Secondary | ICD-10-CM

## 2014-04-27 LAB — BASIC METABOLIC PANEL
Anion gap: 9 (ref 5–15)
BUN: 67 mg/dL — ABNORMAL HIGH (ref 6–23)
CALCIUM: 8.9 mg/dL (ref 8.4–10.5)
CO2: 31 mmol/L (ref 19–32)
Chloride: 101 mmol/L (ref 96–112)
Creatinine, Ser: 1.56 mg/dL — ABNORMAL HIGH (ref 0.50–1.35)
GFR, EST AFRICAN AMERICAN: 54 mL/min — AB (ref >90)
GFR, EST NON AFRICAN AMERICAN: 46 mL/min — AB (ref >90)
Glucose, Bld: 212 mg/dL — ABNORMAL HIGH (ref 70–99)
Potassium: 4 mmol/L (ref 3.5–5.1)
SODIUM: 141 mmol/L (ref 135–145)

## 2014-04-27 LAB — CBC
HEMATOCRIT: 24.7 % — AB (ref 39.0–52.0)
HEMOGLOBIN: 8.2 g/dL — AB (ref 13.0–17.0)
MCH: 32 pg (ref 26.0–34.0)
MCHC: 33.2 g/dL (ref 30.0–36.0)
MCV: 96.5 fL (ref 78.0–100.0)
Platelets: 175 10*3/uL (ref 150–400)
RBC: 2.56 MIL/uL — ABNORMAL LOW (ref 4.22–5.81)
RDW: 13.6 % (ref 11.5–15.5)
WBC: 14.7 10*3/uL — ABNORMAL HIGH (ref 4.0–10.5)

## 2014-04-27 LAB — GLUCOSE, CAPILLARY
GLUCOSE-CAPILLARY: 192 mg/dL — AB (ref 70–99)
GLUCOSE-CAPILLARY: 154 mg/dL — AB (ref 70–99)
Glucose-Capillary: 256 mg/dL — ABNORMAL HIGH (ref 70–99)

## 2014-04-27 MED ORDER — METOPROLOL SUCCINATE ER 50 MG PO TB24
100.0000 mg | ORAL_TABLET | Freq: Two times a day (BID) | ORAL | Status: DC
  Administered 2014-04-27 – 2014-04-30 (×7): 100 mg via ORAL
  Filled 2014-04-27 (×7): qty 2

## 2014-04-27 MED ORDER — TORSEMIDE 20 MG PO TABS
20.0000 mg | ORAL_TABLET | Freq: Every day | ORAL | Status: DC
  Administered 2014-04-27 – 2014-04-30 (×4): 20 mg via ORAL
  Filled 2014-04-27 (×4): qty 1

## 2014-04-27 MED ORDER — TORSEMIDE 20 MG PO TABS: 20.0000 mg | ORAL_TABLET | Freq: Every day | ORAL | Status: DC

## 2014-04-27 NOTE — Clinical Social Work Note (Signed)
CSW updated PNC on pt. Anticipate weekend d/c. Discussed plans with pt and pt's wife again.  Derenda FennelKara Cherell Colvin, [place] [phone]

## 2014-04-27 NOTE — Progress Notes (Signed)
Notified by Octavio GravesBonita the NT for this patient today that Dr. Irene LimboGoodrich asked her to put on the patients TED hose.  She stated that she put on the hose an the patient refused to wear them and pulled them back off.  I went in to educate the patient about the importance of wearing the hose relation to circulation and blood clots.  Patient stated that he would wear the hose.  The hose was placed at this time.

## 2014-04-27 NOTE — Progress Notes (Signed)
Patient ID: Gabriel Reynolds, male   DOB: 04-18-1955, 59 y.o.   MRN: [id number]     Subjective:    No complaints overnight.   Objective:   Temp:  [97.6 F (36.4 C)-98.1 F (36.7 C)] 97.8 F (36.6 C) (04/01 0644) Pulse Rate:  [64-79] 75 (04/01 0644) Resp:  [20-21] 21 (04/01 0644) BP: (109-143)/(71-77) 126/71 mmHg (04/01 0644) SpO2:  [95 %-98 %] 95 % (04/01 0644) Weight:  [264 lb 8 oz (119.976 kg)] 264 lb 8 oz (119.976 kg) (04/01 0644) Last BM Date: 04/25/14  Filed Weights   04/24/14 0500 04/25/14 0500 04/27/14 0644  Weight: 279 lb 15.8 oz (127 kg) 280 lb 4.8 oz (127.143 kg) 264 lb 8 oz (119.976 kg)    Intake/Output Summary (Last 24 hours) at 04/27/14 0845 Last data filed at 04/27/14 0602  Gross per 24 hour  Intake 1361.67 ml  Output   1400 ml  Net -38.33 ml    Telemetry: aflutter elevated rates  Exam:  General: NAD  Resp: CTAB  Cardiac: irreg, rate 105, no m/r/g,no JVD  GI: abdomen soft, NT, ND  MSK: 2+ RLE edema  Neuro: no focal deficits   Lab Results:  Basic Metabolic Panel:  Recent Labs Lab 04/22/14 0859  04/25/14 0659 04/26/14 1055 04/27/14 0704  NA  --   < > 147* 142 141  K  --   < > 4.1 3.7 4.0  CL  --   < > 107 102 101  CO2  --   < > 31 32 31  GLUCOSE  --   < > 68* 112* 212*  BUN  --   < > 91* 75* 67*  CREATININE  --   < > 1.53* 1.55* 1.56*  CALCIUM  --   < > 9.7 8.9 8.9  MG 2.0  --   --   --   --   < > = values in this interval not displayed.  Liver Function Tests:  Recent Labs Lab 04/23/14 0600  AST 32  ALT 37  ALKPHOS 66  BILITOT 1.8*  PROT 6.4  ALBUMIN 3.1*    CBC:  Recent Labs Lab 04/25/14 0659 04/26/14 1055 04/27/14 0704  WBC 25.0* 18.0* 14.7*  HGB 9.3* 8.7* 8.2*  HCT 27.8* 25.8* 24.7*  MCV 96.5 96.6 96.5  PLT 196 180 175    Cardiac Enzymes: No results for input(s): CKTOTAL, CKMB, CKMBINDEX, TROPONINI in the last 168 hours.  BNP: No results for input(s): PROBNP in the last 8760  hours.  Coagulation:  Recent Labs Lab 04/24/14 0430  INR 1.17    ECG:   Medications:   Scheduled Medications: . apixaban  10 mg Oral BID   Followed by  . [START ON 05/01/2014] apixaban  5 mg Oral BID  . cefTRIAXone (ROCEPHIN)  IV  2 g Intravenous Q24H  . feeding supplement (GLUCERNA SHAKE)  237 mL Oral TID BM  . feeding supplement (PRO-STAT SUGAR FREE 64)  30 mL Oral BID  . folic acid  1 mg Per Tube Daily  . insulin aspart  0-9 Units Subcutaneous TID WC  . insulin detemir  20 Units Subcutaneous Q lunch  . metoprolol succinate  75 mg Oral BID  . multivitamin with minerals  1 tablet Per Tube Daily  . sodium chloride  10-40 mL Intracatheter Q12H  . sodium chloride  10-40 mL Intracatheter Q12H  . sodium chloride  3 mL Intravenous Q12H  . thiamine  100 mg Oral Daily  Or  . thiamine  100 mg Intravenous Daily  . [START ON 04/28/2014] torsemide  20 mg Oral Daily     Infusions:     PRN Medications:  metoprolol, naphazoline, ondansetron **OR** ondansetron (ZOFRAN) IV, sodium chloride, sodium chloride     Assessment/Plan    1. Acute systolic/diastolic heart failure - LVEF 16-10%, restrictive diastolic function. Moderate RV dysfunction - negative 150 mL yesterday, negative 9.8 liters since admission. Labs pending this AM, Cr and BUN had been trending down, Na trending up. Started on oral torsemide . I/Os overall even yesterday, negative 9.6 liters since admission - on Toprol XL  bid. No ACE or ARB due to angioedema and poor renal function. No aldactone due to poor renal function. Will consider hydral/nitrates however titrating beta blocker currently in setting of aflutter with elevated rates.  - CXR yesterday no pulmonary edema - will consider ischemic testing as outpatient, his Cr likely will prohibit a cath.   2. Aflutter - elevated rates, will increase Toprol XL to  bid. He is on eliquis - may consider DCCV after 3-4 weeks of anticoag as outpatient - if  increased Toprol does not control rates would start amiodarone  bid over the weekend. Another potential for his systolic dysfunction could be tachycardia mediated, will work to gain either rate or rhythm control.   3. DVT - on anticoag with eliquis, continue for DVT and aflutter       Dina Rich, M.D.,

## 2014-04-27 NOTE — Progress Notes (Deleted)
UR chart review completed.  

## 2014-04-27 NOTE — Progress Notes (Signed)
Subjective: Interval History: Patient says that he is feeling better. At this moment he offers no complaints.  Objective: Vital signs in last 24 hours: Temp:  [97.6 F (36.4 C)-98.1 F (36.7 C)] 97.8 F (36.6 C) (04/01 0644) Pulse Rate:  [64-118] 75 (04/01 0644) Resp:  [20-21] 21 (04/01 0644) BP: (109-143)/(71-77) 126/71 mmHg (04/01 0644) SpO2:  [95 %-98 %] 95 % (04/01 0644) Weight:  [119.976 kg (264 lb 8 oz)] 119.976 kg (264 lb 8 oz) (04/01 0644) Weight change:   Intake/Output from previous day: 03/31 0701 - 04/01 0700 In: 1601.7 [P.O.:720; I.V.:831.7; IV Piggyback:50] Out: 1400 [Urine:1400] Intake/Output this shift:     generally patient is alert and in no apparent distress. Chest: Decreased breath sounds bilaterally. Heart exam revealed regular rate and rhythm Abdomen: Distended but nontender and positive bowel sound. Extremities: He has no edema on the left but he has trace edema on the right.    Lab Results:  Recent Labs  04/26/14 1055 04/27/14 0704  WBC 18.0* 14.7*  HGB 8.7* 8.2*  HCT 25.8* 24.7*  PLT 180 175   BMET:   Recent Labs  04/26/14 1055 04/27/14 0704  NA 142 141  K 3.7 4.0  CL 102 101  CO2 32 31  GLUCOSE 112* 212*  BUN 75* 67*  CREATININE 1.55* 1.56*  CALCIUM 8.9 8.9   No results for input(s): PTH in the last 72 hours. Iron Studies: No results for input(s): IRON, TIBC, TRANSFERRIN, FERRITIN in the last 72 hours.  Studies/Results: Dg Chest Port 1 View  04/26/2014   CLINICAL DATA:  Shortness of breath and edema  EXAM: PORTABLE CHEST - 1 VIEW  COMPARISON:  04/22/2014  FINDINGS: Cardiac shadow is mildly enlarged but stable. A left PICC line is again seen in the mid superior vena cava. No focal infiltrate or sizable effusion is seen. No bony abnormality is noted.  IMPRESSION: No acute abnormality seen.   Electronically Signed   By: Alcide CleverMark  Lukens M.D.   On: 04/26/2014 09:47    I have reviewed the patient's current  medications.  Assessment/Plan: Problem #1 acute kidney injury: His renal function continue to improve . His renal function seems to be stable. His potassium is normal. Problem #3 liver cirrhosis: Patient still with abdominal distention but he doesn't have significant edema. Problem #4 severe combined systolic and diastolic dysfunction. Presently patient is nonoliguric and he is asymptomatic. Problem #5 respiratory failure: Has recovered. Problem #6  sepsis: patient afebrile however his white blood cell  count is still high.  Problem #7 angioedema: Has resolved Problem #8 hypernatremia: Patient on D5 water and his sodium has normalized. Plan: We'll DC his IV fluid Change Demadex to 20 mg by mouth once a day Encouraged patient to increase his intake of free water. Since his renal function is stable I will sign off and follow patient as an outpatient. Thank you for letting me participate in his management.   LOS: 14 days   Chaden Doom S 04/27/2014,8:17 AM

## 2014-04-27 NOTE — Progress Notes (Signed)
PROGRESS NOTE  Gabriel Reynolds ZOX:[id number] DOB: April 24, 1955 DOA: 04/13/2014 PCP: Pcp Not In System  [place]  Summary: 59 year old man with history of diabetes presented with significant lower extremity edema and facial swelling while on lisinopril. Workup in the emergency department revealed an enlarged heart on chest x-ray, elevated BNP, modestly elevated troponin and tachycardia. Admitted for suspected CHF, cardiology consultation, angioedema, tachycardia. He was seen in consultation with cardiology and started on diuretics and treated for angioedema. Echocardiogram revealed severe systolic and diastolic dysfunction as well as right ventricular dysfunction. The patient developed symptoms suggestive of alcohol withdrawal/DTs and was electively intubated for airway protection. He developed acute renal failure and required dobutamine. Developed atrial flutter. Found to have right lower extremity DVT and started on heparin. Found to have bacteremia and given difficult intubation there was concern for aspiration pneumonia and he was started on antibiotics. Renal function slowly improved. Patient self extubated 3/24 and was reintubated. Angioedema persisted and was treated with FFP. Self extubated 3/26. Heart rate control was difficult. He was transitioned to oral anticoagulants.   Assessment/Plan: 1. Acute hypoxic respiratory failure. Resolved. Multifactorial: CHF, aflutter, sepsis. 2. AKI, continues to improve, non-oliguric. No change in creatinine but BUN still improving. Nephrology signed off. Prerenal vs hepatorenal vs cardiorenal vs ATN. 3. Acute systolic/diastolic CHF, biventricular failure. Continues to improve. High suspicion for alcoholic cardiomyopathy. Weaned off dobutamine infusion 3/24. LVEF 20-25%. PE cannot be excluded. 4. Atrial fibrillation, flutter. Rate control remains difficult, beta blocker adjusted by cardiology again today. Eliquis. 5. Sepsis secondary to Streptococcus viridans  bacteremia. Source possible pneumonia but suspect dentition. Pneumonia or aspiration pneumonia with sepsis was considered. WBC improving, likely somewhat elevated because of steroids. Discussed with Dr. Orvan Falconer, ID, recommends 2 weeks IV abx total. 6. VDRF multifactorial, resolved: CHF, angioedema, DTs. Self extubated 3/24 requiring reintubation. Self extubated again 3/26, did not require reintubation. 7. Normocytic anemia, remains stable. 8. Alcohol abuse. DTs suspected, resolved. Treated with Precedex.  9. Right lower extremity DVT, possible PE. Eliquis. Cannot rule out PE given right ventricular systolic dysfunction noted on echo 3/19.  Hemodynamics are stable and further investigation would not change management at this point.  10. Angioedema. Secondary to ACE inhibitor. Appears resovled. 11. Cirrhosis, Suspect alcoholic in nature. Hepatitis panel was negative.  12. Obesity    Improving, major issue precluding discharge is atrial flutter with tachycardia.  Continue management per cardiology, no ACE-I or ARB secondary to angioedema and poor renal function. No Aldactone secondary to poor renal function. Toprol increased to  BID. Consider amiodarone over weekend if rate remains elevated. Ischemic testing will be deferred to the outpatient setting. Cardioversion will be considered after 3-4 weeks of anticoagulation.   Demadex 20 mg daily per nephrology  Possible transfer to SNF over the weekend.  Discussed with wife at bedside.  Code Status: full code DVT prophylaxis: Eliquis Family Communication:  Disposition Plan: SNF  Brendia Sacks, MD  [facility]  Pager [phone] If 7PM-7AM, please contact night-coverage at [email or web address], password [facility] 04/27/2014, 9:15 AM  LOS: 14 days   Consultants:  Nephrology--signed off 4/1  Pulmonology--signed off 3/29  Cardiology  ST: Dysphagia 3 (Mechanical Soft);Thin liquid   PT: SNF  Procedures:  ETT 3/20 >>  CVC 3/20  >>  PICC  3/25, 3/26 FFP  Antibiotics:  Zosyn 3/21>>> 3/24  Vancomycin 3/22>>>3/24  Ceftriaxone 3/24 >> 4/3  HPI/Subjective: Did better with PT yesterday. HR still elevated, cardiology increased Toprol-XL.  Tongue improving. No chest pain. Breathing fine. No  complaints.  Objective: Filed Vitals:   04/26/14 0838 04/26/14 1435 04/26/14 2141 04/27/14 0644  BP:  143/77 109/75 126/71  Pulse: 118 79 64 75  Temp:  97.6 F (36.4 C) 98.1 F (36.7 C) 97.8 F (36.6 C)  TempSrc:   Oral Oral  Resp:  20 20 21   Height:      Weight:    119.976 kg (264 lb 8 oz)  SpO2:  96% 98% 95%    Intake/Output Summary (Last 24 hours) at 04/27/14 0915 Last data filed at 04/27/14 0602  Gross per 24 hour  Intake 1361.67 ml  Output   1400 ml  Net -38.33 ml     Filed Weights   04/24/14 0500 04/25/14 0500 04/27/14 0644  Weight: 127 kg (279 lb 15.8 oz) 127.143 kg (280 lb 4.8 oz) 119.976 kg (264 lb 8 oz)    Exam:     Afebrile, VSS. Stable oxygenation on RA. General: Appears calm and comfortable Tongue appears unremarkable, as do lips Cardiovascular: irregular, tachycardic, no m/r/g. No left lower extremity edema. RLE 3+, without change. Telemetry: atrial flutter, >100  Respiratory: CTA bilaterally, no w/r/r. Normal respiratory effort. Musculoskeletal: grossly normal tone BUE/BLE Psychiatric: grossly normal mood and affect, speech fluent and appropriate  Data Reviewed:  UOP 1400  I/O +200; -9.6 L since admission  Capillary blood sugars stable, with no lows last 24 hours  WBC 25 >>18 >> 14.7 (suspect steroids)   Hemoglobin stable 8.7 >> 8.2  CXR no acute disease  Hepatitis panel negative  BC 2/2 Strep viridans, sensitive to PCN  Pending data:  Repeat BC  Scheduled Meds: . apixaban  10 mg Oral BID   Followed by  . [START ON 05/01/2014] apixaban  5 mg Oral BID  . cefTRIAXone (ROCEPHIN)  IV  2 g Intravenous Q24H  . feeding supplement (GLUCERNA SHAKE)  237 mL Oral TID BM  .  feeding supplement (PRO-STAT SUGAR FREE 64)  30 mL Oral BID  . folic acid  1 mg Per Tube Daily  . insulin aspart  0-9 Units Subcutaneous TID WC  . insulin detemir  20 Units Subcutaneous Q lunch  . metoprolol succinate  100 mg Oral BID  . multivitamin with minerals  1 tablet Per Tube Daily  . sodium chloride  10-40 mL Intracatheter Q12H  . sodium chloride  10-40 mL Intracatheter Q12H  . sodium chloride  3 mL Intravenous Q12H  . thiamine  100 mg Oral Daily   Or  . thiamine  100 mg Intravenous Daily  . torsemide  20 mg Oral Daily   Continuous Infusions:    Principal Problem:   Acute respiratory failure with hypoxemia Active Problems:   Diabetes type 2, controlled   Angioedema   Acute combined systolic and diastolic CHF, NYHA class 4   Delirium tremens   DVT (deep venous thrombosis)   Ventilator dependence   Severe sepsis   Time spent 15 minutes

## 2014-04-28 DIAGNOSIS — T783XXA Angioneurotic edema, initial encounter: Secondary | ICD-10-CM

## 2014-04-28 DIAGNOSIS — N179 Acute kidney failure, unspecified: Secondary | ICD-10-CM

## 2014-04-28 DIAGNOSIS — F1010 Alcohol abuse, uncomplicated: Secondary | ICD-10-CM

## 2014-04-28 DIAGNOSIS — I4892 Unspecified atrial flutter: Secondary | ICD-10-CM

## 2014-04-28 DIAGNOSIS — A491 Streptococcal infection, unspecified site: Secondary | ICD-10-CM

## 2014-04-28 DIAGNOSIS — B955 Unspecified streptococcus as the cause of diseases classified elsewhere: Secondary | ICD-10-CM

## 2014-04-28 DIAGNOSIS — K7460 Unspecified cirrhosis of liver: Secondary | ICD-10-CM

## 2014-04-28 DIAGNOSIS — R7881 Bacteremia: Secondary | ICD-10-CM

## 2014-04-28 HISTORY — DX: Angioneurotic edema, initial encounter: T78.3XXA

## 2014-04-28 HISTORY — DX: Alcohol abuse, uncomplicated: F10.10

## 2014-04-28 HISTORY — DX: Unspecified atrial flutter: I48.92

## 2014-04-28 HISTORY — DX: Unspecified cirrhosis of liver: K74.60

## 2014-04-28 HISTORY — DX: Unspecified streptococcus as the cause of diseases classified elsewhere: B95.5

## 2014-04-28 HISTORY — DX: Bacteremia: R78.81

## 2014-04-28 HISTORY — DX: Streptococcal infection, unspecified site: A49.1

## 2014-04-28 LAB — GLUCOSE, CAPILLARY
GLUCOSE-CAPILLARY: 58 mg/dL — AB (ref 70–99)
GLUCOSE-CAPILLARY: 119 mg/dL — AB (ref 70–99)
Glucose-Capillary: 173 mg/dL — ABNORMAL HIGH (ref 70–99)
Glucose-Capillary: 85 mg/dL (ref 70–99)
Glucose-Capillary: 81 mg/dL (ref 70–99)
Glucose-Capillary: 162 mg/dL — ABNORMAL HIGH (ref 70–99)

## 2014-04-28 MED ORDER — INSULIN DETEMIR 100 UNIT/ML ~~LOC~~ SOLN
15.0000 [IU] | Freq: Every day | SUBCUTANEOUS | Status: DC
  Filled 2014-04-28 (×3): qty 0.15

## 2014-04-28 MED ORDER — GLUCOSE 40 % PO GEL: 1.0000 | ORAL | Status: AC

## 2014-04-28 MED ORDER — INSULIN ASPART 100 UNIT/ML ~~LOC~~ SOLN
3.0000 [IU] | Freq: Three times a day (TID) | SUBCUTANEOUS | Status: DC
  Administered 2014-04-28 – 2014-04-30 (×6): 3 [IU] via SUBCUTANEOUS

## 2014-04-28 MED ORDER — INSULIN DETEMIR 100 UNIT/ML ~~LOC~~ SOLN
5.0000 [IU] | Freq: Every day | SUBCUTANEOUS | Status: DC
  Administered 2014-04-29: 5 [IU] via SUBCUTANEOUS
  Filled 2014-04-28 (×2): qty 0.05

## 2014-04-28 MED ORDER — GLUCOSE 40 % PO GEL
ORAL | Status: AC
Start: 2014-04-28 — End: 2014-04-28
  Filled 2014-04-28: qty 1

## 2014-04-28 NOTE — Progress Notes (Addendum)
PROGRESS NOTE  Gabriel Reynolds ZOX:[id number] DOB: December 27, 1955 DOA: 04/13/2014 PCP: Pcp Not In System  [place]  Summary: 59 year old man with history of diabetes presented with significant lower extremity edema and facial swelling while on lisinopril. Workup in the emergency department revealed an enlarged heart on chest x-ray, elevated BNP, modestly elevated troponin and tachycardia. Admitted for suspected CHF, cardiology consultation, angioedema, tachycardia. He was seen in consultation with cardiology and started on diuretics and treated for angioedema. Echocardiogram revealed severe systolic and diastolic dysfunction as well as right ventricular dysfunction. The patient developed symptoms suggestive of alcohol withdrawal/DTs and was electively intubated for airway protection. He developed acute renal failure and required dobutamine. Developed atrial flutter. Found to have right lower extremity DVT and started on heparin. Found to have bacteremia and given difficult intubation there was concern for aspiration pneumonia and he was started on antibiotics. Renal function slowly improved. Patient self extubated 3/24 and was reintubated. Angioedema persisted and was treated with FFP. Self extubated 3/26. Heart rate control was difficult. He was transitioned to oral anticoagulants.   Assessment/Plan: 1. Atrial fibrillation, flutter. Rate control improved with increased dose of beta blocker. Continue Eliquis. 2. Acute hypoxic respiratory failure. Resolved. Multifactorial: CHF, aflutter, sepsis. 3. AKI, improving, non-oliguric. Nephrology signed off. Prerenal vs hepatorenal vs cardiorenal vs ATN. 4. Acute systolic/diastolic CHF, biventricular failure. Compensated. High suspicion for alcoholic cardiomyopathy. Weaned off dobutamine infusion 3/24. LVEF 20-25%. PE cannot be excluded. 5. Sepsis secondary to Streptococcus viridans bacteremia. Source possible pneumonia but suspect dentition. Pneumonia or aspiration  pneumonia with sepsis considered. WBC trending down, likely somewhat elevated because of steroids. Discussed with Dr. Orvan Falconer, ID, recommends 2 weeks IV abx total. 6. VDRF multifactorial, resolved: CHF, angioedema, DTs. Self extubated 3/24 requiring reintubation. Self extubated again 3/26, did not require reintubation. 7. Normocytic anemia, stable. 8. Hyperglycemia. Improving. Some episodes of hypoglycemia. A1c was normal this admission. Likely secondary to acute illness. No evidence of diabetes. 9. Alcohol abuse. DTs suspected, resolved. Treated with Precedex.  10. Right lower extremity DVT, possible PE. Eliquis. Cannot rule out PE given right ventricular systolic dysfunction noted on echo 3/19.  Hemodynamics are stable and further investigation would not change management at this point.  11. Angioedema. Secondary to ACE inhibitor. Resolved.  12. Cirrhosis, Suspect alcoholic in nature. Hepatitis panel negative.  13. Obesity    Overall improving. Heart rate controlled today. Plan to monitor today, if remains controlled without significant tachycardia, likely can transfer to skilled nursing facility tomorrow.  Continue management per cardiology, no ACE-I or ARB secondary to angioedema and poor renal function. No Aldactone secondary to poor renal function. Continue Toprol. Consider amiodarone if rate increases. Ischemic testing will be deferred to the outpatient setting. Cardioversion will be considered after 3-4 weeks of anticoagulation.   Decrease long-acting insulin. Kimberly wean off in the next 1-2 days.  Code Status: full code DVT prophylaxis: Eliquis Family Communication:  Disposition Plan: SNF  Brendia Sacks, MD  [facility]  Pager [phone] If 7PM-7AM, please contact night-coverage at [email or web address], password [facility] 04/28/2014, 9:26 AM  LOS: 15 days   Consultants:  Nephrology--signed off 4/1  Pulmonology--signed off 3/29  Cardiology  ST: Dysphagia 3 (Mechanical  Soft);Thin liquid   PT: SNF  Procedures:  ETT 3/20 >> 3/26  CVC 3/20 >>  PICC  3/25, 3/26 FFP  Antibiotics:  Zosyn 3/21>>> 3/24  Vancomycin 3/22>>>3/24  Ceftriaxone 3/24 >> 4/6  HPI/Subjective: Hypoglycemic this AM.  Feels fine. No complaints.  Objective: Filed Vitals:  04/27/14 2022 04/27/14 2215 04/28/14 0000 04/28/14 0632  BP: 114/73 128/81 123/76 149/81  Pulse: 77 106 99 102  Temp: 97.8 F (36.6 C)   98.3 F (36.8 C)  TempSrc: Oral   Oral  Resp: 20   22  Height:      Weight:    119.568 kg (263 lb 9.6 oz)  SpO2: 97% 100%  97%    Intake/Output Summary (Last 24 hours) at 04/28/14 0926 Last data filed at 04/28/14 0630  Gross per 24 hour  Intake  797.5 ml  Output   1500 ml  Net -702.5 ml     Filed Weights   04/25/14 0500 04/27/14 0644 04/28/14 0632  Weight: 127.143 kg (280 lb 4.8 oz) 119.976 kg (264 lb 8 oz) 119.568 kg (263 lb 9.6 oz)    Exam:     Afebrile, VSS. HR labile. No hypoxia. General:  Appears comfortable, calm. Cardiovascular: Irregular, normal rate, no murmur, rub or gallop. No change RLE. Telemetry: Atrial flutter, 80s/90s Respiratory: Clear to auscultation bilaterally, no wheezes, rales or rhonchi. Normal respiratory effort. Psychiatric: grossly normal mood and affect, speech fluent and appropriate  Data Reviewed:  Weight unreliable but stable from yesterday  UOP 1500  I/O -582; -10.2 L since admission  Capillary blood sugars noted, low this AM 58  WBC 25 >>18 >> 14.7 (suspect steroids)   Hemoglobin stable 8.7 >> 8.2  CXR no acute disease  Hepatitis panel negative  BC 2/2 Strep viridans, sensitive to PCN  Pending data:  Repeat BC no growth to date  Scheduled Meds: . apixaban  10 mg Oral BID   Followed by  . [START ON 05/01/2014] apixaban  5 mg Oral BID  . cefTRIAXone (ROCEPHIN)  IV  2 g Intravenous Q24H  . dextrose  1 Tube Oral STAT  . feeding supplement (GLUCERNA SHAKE)  237 mL Oral TID BM  . feeding  supplement (PRO-STAT SUGAR FREE 64)  30 mL Oral BID  . folic acid  1 mg Per Tube Daily  . insulin aspart  0-9 Units Subcutaneous TID WC  . insulin aspart  3 Units Subcutaneous TID WC  . insulin detemir  15 Units Subcutaneous Q lunch  . metoprolol succinate  100 mg Oral BID  . multivitamin with minerals  1 tablet Per Tube Daily  . sodium chloride  10-40 mL Intracatheter Q12H  . sodium chloride  10-40 mL Intracatheter Q12H  . sodium chloride  3 mL Intravenous Q12H  . thiamine  100 mg Oral Daily   Or  . thiamine  100 mg Intravenous Daily  . torsemide  20 mg Oral Daily   Continuous Infusions:    Principal Problem:   Atrial flutter Active Problems:   Acute combined systolic and diastolic CHF, NYHA class 4   DVT (deep venous thrombosis)   AKI (acute kidney injury)   Streptococcus viridans infection   Bacteremia due to Streptococcus   Hepatic cirrhosis   Alcohol abuse   Angioedema   Time spent 15 minutes

## 2014-04-28 NOTE — Progress Notes (Signed)
Hypoglycemic Event  CBG:58  Treatment: 4 oz juice  Symptoms: Thirsty  Follow-up CBG: Time:0810 CBG Result:85  Possible Reasons for Event: Poor food intake  Comments/MD notified:   Gabriel Reynolds, Gabriel Reynolds  Remember to initiate Hypoglycemia Order Set & complete

## 2014-04-29 DIAGNOSIS — B954 Other streptococcus as the cause of diseases classified elsewhere: Secondary | ICD-10-CM

## 2014-04-29 LAB — GLUCOSE, CAPILLARY
Glucose-Capillary: 201 mg/dL — ABNORMAL HIGH (ref 70–99)
Glucose-Capillary: 199 mg/dL — ABNORMAL HIGH (ref 70–99)
Glucose-Capillary: 156 mg/dL — ABNORMAL HIGH (ref 70–99)
Glucose-Capillary: 149 mg/dL — ABNORMAL HIGH (ref 70–99)

## 2014-04-29 NOTE — Progress Notes (Signed)
PROGRESS NOTE  Geri SeminoleRalph E Cartlidge UUV:[id number] DOB: 19-Oct-1955 DOA: 04/13/2014 PCP: Pcp Not In System  [place]  Summary: 59 year old man with history of diabetes presented with significant lower extremity edema and facial swelling while on lisinopril. Workup in the emergency department revealed an enlarged heart on chest x-ray, elevated BNP, modestly elevated troponin and tachycardia. Admitted for suspected CHF, cardiology consultation, angioedema, tachycardia. He was seen in consultation with cardiology and started on diuretics and treated for angioedema. Echocardiogram revealed severe systolic and diastolic dysfunction as well as right ventricular dysfunction. The patient developed symptoms suggestive of alcohol withdrawal/DTs and was electively intubated for airway protection. He developed acute renal failure and required dobutamine. Developed atrial flutter. Found to have right lower extremity DVT and started on heparin. Found to have bacteremia and given difficult intubation there was concern for aspiration pneumonia and he was started on antibiotics. Renal function slowly improved. Patient self extubated 3/24 and was reintubated. Angioedema persisted and was treated with FFP. Self extubated 3/26. Heart rate control was difficult. He was transitioned to oral anticoagulants.   Assessment/Plan: 1. Atrial fibrillation, flutter. Rate control improved with increased dose of beta blocker but borderline, plan to watch today, if stable, will plan discharge to SNF 4/4.Marland Kitchen. Continue Eliquis. 2. Acute hypoxic respiratory failure. Resolved. Multifactorial: CHF, aflutter, sepsis. 3. AKI, improving, non-oliguric. Nephrology signed off. Prerenal vs hepatorenal vs cardiorenal vs ATN. 4. Acute systolic/diastolic CHF, biventricular failure. Compensated. High suspicion for alcoholic cardiomyopathy. Weaned off dobutamine infusion 3/24. LVEF 20-25%. PE cannot be excluded. 5. Sepsis secondary to Streptococcus viridans  bacteremia. Source possible pneumonia but suspect dentition. Pneumonia or aspiration pneumonia with sepsis considered. WBC trended down, likely somewhat elevated because of steroids. Discussed with Dr. Orvan Falconerampbell, ID, recommends 2 weeks IV abx total. Repeat blood cultures pending, no growth to date. 6. VDRF multifactorial, resolved: CHF, angioedema, DTs. Self extubated 3/24 requiring reintubation. Self extubated again 3/26, did not require reintubation. 7. Normocytic anemia, stable. 8. Hyperglycemia. Stable. Some episodes of hypoglycemia. A1c was normal this admission. Likely secondary to acute illness. No evidence of diabetes. 9. Alcohol abuse. DTs suspected, resolved. Treated with Precedex.  10. Right lower extremity DVT, possible PE. Eliquis. Cannot rule out PE given right ventricular systolic dysfunction noted on echo 3/19.  Hemodynamics are stable and further investigation would not change management at this point.  11. Angioedema. Secondary to ACE inhibitor. Resolved.  12. Cirrhosis, Suspect alcoholic in nature. Hepatitis panel negative.  13. Obesity    Overall stable, borderline rate control but better on higher dose of Toprol.  Continue management per cardiology, no ACE-I or ARB secondary to angioedema and poor renal function. No Aldactone secondary to poor renal function. Continue Toprol. Consider amiodarone if rate increases but likely won't need. Ischemic testing will be deferred to the outpatient setting. Cardioversion will be considered after 3-4 weeks of anticoagulation.   Anticipate transfer to SNF 4/4 if HR <110.  Code Status: full code DVT prophylaxis: Eliquis Family Communication: discussed with wife at bedside Disposition Plan: SNF  Brendia Sacksaniel Becker Christopher, MD  [facility]  Pager [phone] If 7PM-7AM, please contact night-coverage at [email or web address], password [email or web address] 04/29/2014, 12:55 PM  LOS: 16 days   Consultants:  Nephrology--signed off 4/1  Pulmonology--signed off  3/29  Cardiology  ST: Dysphagia 3 (Mechanical Soft);Thin liquid   PT: SNF  Procedures:  ETT 3/20 >> 3/26  PICC  3/25, 3/26 FFP  Antibiotics:  Zosyn 3/21>>> 3/24  Vancomycin 3/22>>>3/24  Ceftriaxone 3/24 >> 4/6  HPI/Subjective: No complaints.  Objective: Filed Vitals:   04/28/14 2133 04/29/14 0000 04/29/14 0536 04/29/14 0543  BP: 124/71 130/78 142/83   Pulse: 101 103 100   Temp: 98.7 F (37.1 C)  97.6 F (36.4 C)   TempSrc: Oral  Oral   Resp:      Height:      Weight:    117.663 kg (259 lb 6.4 oz)  SpO2: 99%  100%     Intake/Output Summary (Last 24 hours) at 04/29/14 1255 Last data filed at 04/29/14 1017  Gross per 24 hour  Intake    490 ml  Output    400 ml  Net     90 ml     Filed Weights   04/27/14 0644 04/28/14 0632 04/29/14 0543  Weight: 119.976 kg (264 lb 8 oz) 119.568 kg (263 lb 9.6 oz) 117.663 kg (259 lb 6.4 oz)    Exam:     Afebrile, vitals stable. HR labile.  General:  Appears calm and comfortable Cardiovascular: irregular, tachycardic, no m/r/g. No left LE edema. No change in RLE edema. Telemetry: atrial flutter 90-110s. Respiratory: CTA bilaterally, no w/r/r. Normal respiratory effort. Psychiatric: grossly normal mood and affect, speech fluent and appropriate  Data Reviewed:  Weights unreliable   UOP 400, -10.2 L since admission  Capillary blood sugars stable, no hypoglycemia  WBC 25 >>18 >> 14.7 (suspect steroids)   Hemoglobin stable 8.7 >> 8.2  CXR no acute disease  Hepatitis panel negative  BC 2/2 Strep viridans, sensitive to PCN  Pending data:  Repeat BC no growth to date  Scheduled Meds: . apixaban  10 mg Oral BID   Followed by  . [START ON 05/01/2014] apixaban  5 mg Oral BID  . cefTRIAXone (ROCEPHIN)  IV  2 g Intravenous Q24H  . feeding supplement (GLUCERNA SHAKE)  237 mL Oral TID BM  . feeding supplement (PRO-STAT SUGAR FREE 64)  30 mL Oral BID  . folic acid  1 mg Per Tube Daily  . insulin aspart  0-9  Units Subcutaneous TID WC  . insulin aspart  3 Units Subcutaneous TID WC  . insulin detemir  5 Units Subcutaneous Q lunch  . metoprolol succinate  100 mg Oral BID  . multivitamin with minerals  1 tablet Per Tube Daily  . sodium chloride  10-40 mL Intracatheter Q12H  . sodium chloride  10-40 mL Intracatheter Q12H  . sodium chloride  3 mL Intravenous Q12H  . thiamine  100 mg Oral Daily   Or  . thiamine  100 mg Intravenous Daily  . torsemide  20 mg Oral Daily   Continuous Infusions:    Principal Problem:   Atrial flutter Active Problems:   Acute combined systolic and diastolic CHF, NYHA class 4   DVT (deep venous thrombosis)   AKI (acute kidney injury)   Streptococcus viridans infection   Bacteremia due to Streptococcus   Hepatic cirrhosis   Alcohol abuse   Angioedema   Time spent 20 minutes

## 2014-04-29 NOTE — Progress Notes (Signed)
ANTICOAGULATION CONSULT NOTE  Pharmacy Consult for Eliquis Indication: DVT  Allergies  Allergen Reactions  . Ace Inhibitors Swelling  . Other Hives    wool   Patient Measurements: Height: 6\' 1"  (185.4 cm) Weight: 259 lb 6.4 oz (117.663 kg) IBW/kg (Calculated) : 79.9 Heparin Dosing Weight: 111.4 kg  Vital Signs: Temp: 97.6 F (36.4 C) (04/03 0536) Temp Source: Oral (04/03 0536) BP: 142/83 mmHg (04/03 0536) Pulse Rate: 100 (04/03 0536)  Labs:  Recent Labs  04/26/14 1055 04/27/14 0704  HGB 8.7* 8.2*  HCT 25.8* 24.7*  PLT 180 175  CREATININE 1.55* 1.56*   Estimated Creatinine Clearance: 66.8 mL/min (by C-G formula based on Cr of 1.56).  Medical History: Past Medical History  Diagnosis Date  . Hypertension   . Diabetes mellitus    Medications:  Scheduled:  . apixaban  10 mg Oral BID   Followed by  . [START ON 05/01/2014] apixaban  5 mg Oral BID  . cefTRIAXone (ROCEPHIN)  IV  2 g Intravenous Q24H  . feeding supplement (GLUCERNA SHAKE)  237 mL Oral TID BM  . feeding supplement (PRO-STAT SUGAR FREE 64)  30 mL Oral BID  . folic acid  1 mg Per Tube Daily  . insulin aspart  0-9 Units Subcutaneous TID WC  . insulin aspart  3 Units Subcutaneous TID WC  . insulin detemir  5 Units Subcutaneous Q lunch  . metoprolol succinate  100 mg Oral BID  . multivitamin with minerals  1 tablet Per Tube Daily  . sodium chloride  10-40 mL Intracatheter Q12H  . sodium chloride  10-40 mL Intracatheter Q12H  . sodium chloride  3 mL Intravenous Q12H  . thiamine  100 mg Oral Daily   Or  . thiamine  100 mg Intravenous Daily  . torsemide  20 mg Oral Daily   Assessment: 59 yo obese M with + BLE DVT and Afib.  He is currently on Eliquis.   Hg and Pltc have been trending down, but no active bleeding noted.   Chronic renal insufficiency noted (Scr>1.5), however estimated CrCl (using pt's actual body weight) ~4880ml/min. Patient & family educated on Eliquis.  Plan:  Continue Eliquis 10mg  po  BID x 7 days then decrease dose to 5mg  po bid (on 05/01/14) Monitor CBC & for s/sx of bleeding   Ercil Cassis, Mercy RidingAndrea Michelle 04/29/2014,9:15 AM

## 2014-04-30 ENCOUNTER — Inpatient Hospital Stay
Admission: RE | Admit: 2014-04-30 | Discharge: 2014-05-20 | Disposition: A | Discharge status: Home / Self Care | Payer: Medicaid Other | Source: Ambulatory Visit | Attending: Internal Medicine | Admitting: Internal Medicine

## 2014-04-30 ENCOUNTER — Inpatient Hospital Stay (HOSPITAL_COMMUNITY): Payer: Medicare Other

## 2014-04-30 ENCOUNTER — Encounter (HOSPITAL_COMMUNITY): Payer: Self-pay | Admitting: Family Medicine

## 2014-04-30 DIAGNOSIS — R7881 Bacteremia: Secondary | ICD-10-CM

## 2014-04-30 DIAGNOSIS — M795 Residual foreign body in soft tissue: Secondary | ICD-10-CM

## 2014-04-30 HISTORY — DX: Residual foreign body in soft tissue: M79.5

## 2014-04-30 LAB — GLUCOSE, CAPILLARY
GLUCOSE-CAPILLARY: 187 mg/dL — AB (ref 70–99)
GLUCOSE-CAPILLARY: 188 mg/dL — AB (ref 70–99)

## 2014-04-30 MED ORDER — ADULT MULTIVITAMIN W/MINERALS CH: 1.0000 | ORAL_TABLET | Freq: Every day | ORAL | Status: DC

## 2014-04-30 MED ORDER — GLUCERNA SHAKE PO LIQD: 237.0000 mL | Freq: Three times a day (TID) | ORAL | Status: DC

## 2014-04-30 MED ORDER — DEXTROSE 5 % IV SOLN: 2.0000 g | INTRAVENOUS | Status: DC

## 2014-04-30 MED ORDER — TORSEMIDE 20 MG PO TABS: 20.0000 mg | ORAL_TABLET | Freq: Every day | ORAL | Status: DC

## 2014-04-30 MED ORDER — PRO-STAT SUGAR FREE PO LIQD: 30.0000 mL | Freq: Two times a day (BID) | ORAL | Status: DC

## 2014-04-30 MED ORDER — INSULIN DETEMIR 100 UNIT/ML ~~LOC~~ SOLN: 5.0000 [IU] | Freq: Every day | SUBCUTANEOUS | Status: AC

## 2014-04-30 MED ORDER — FOLIC ACID 1 MG PO TABS: 1.0000 mg | ORAL_TABLET | Freq: Every day | ORAL | Status: AC

## 2014-04-30 MED ORDER — METOPROLOL SUCCINATE ER 100 MG PO TB24: 100.0000 mg | ORAL_TABLET | Freq: Two times a day (BID) | ORAL | Status: DC

## 2014-04-30 MED ORDER — APIXABAN 5 MG PO TABS: 5.0000 mg | ORAL_TABLET | Freq: Two times a day (BID) | ORAL | Status: DC

## 2014-04-30 MED ORDER — THIAMINE HCL 100 MG PO TABS: 100.0000 mg | ORAL_TABLET | Freq: Every day | ORAL | Status: DC

## 2014-04-30 MED ORDER — APIXABAN 5 MG PO TABS: 10.0000 mg | ORAL_TABLET | Freq: Two times a day (BID) | ORAL | Status: DC

## 2014-04-30 NOTE — Clinical Social Work Note (Signed)
[facility], pt's wife, and facility aware and agreeable. Will transfer with staff. Pt ready to begin therapy in order to get home as soon as possible.  Derenda FennelKara Baby Stairs, [place] [phone]

## 2014-04-30 NOTE — Plan of Care (Signed)
Problem: Phase II Progression Outcomes Goal: Walk in hall or up in chair TID Outcome: Not Met (add Reason) Patient transferring to short term rehab at [facility] for further PT     

## 2014-04-30 NOTE — Progress Notes (Signed)
1400 Called [facility] and report given to CozadVicki, LPN. [id number].

## 2014-04-30 NOTE — Clinical Social Work Placement (Signed)
Clinical Social Work Department CLINICAL SOCIAL WORK PLACEMENT NOTE 04/30/2014  Patient:  Gabriel Reynolds,Gabriel Reynolds  Account Number:  [id number] Admit date:  04/13/2014  Clinical Social Worker:  Derenda FennelKARA Asahel Risden, LCSW  Date/time:  04/24/2014 03:54 PM  Clinical Social Work is seeking post-discharge placement for this patient at the following level of care:   SKILLED NURSING   (*CSW will update this form in Epic as items are completed)   04/24/2014  Patient/family provided with [facility] of Clinical Social Work's list of facilities offering this level of care within the geographic area requested by the patient (or if unable, by the patient's family).  04/24/2014  Patient/family informed of their freedom to choose among providers that offer the needed level of care, that participate in Medicare, Medicaid or managed care program needed by the patient, have an available bed and are willing to accept the patient.  04/24/2014  Patient/family informed of [facility]' ownership interest in [facility], as well as of the fact that they are under no obligation to receive care at this facility.  PASARR submitted to EDS on 04/24/2014 PASARR number received on 04/24/2014  FL2 transmitted to all facilities in geographic area requested by pt/family on  04/24/2014 FL2 transmitted to all facilities within larger geographic area on   Patient informed that his/her managed care company has contracts with or will negotiate with  certain facilities, including the following:     Patient/family informed of bed offers received:  04/25/2014 [facility] bed [facility] Physician recommends and patient chooses bed at    [facility] on  04/30/2014 Patient to be transferred to facility by staff Patient and family notified of transfer on 04/30/2014 Name of family member notified:  Zella BallRobin- wife  The following physician request were entered in  Epic:   Additional Comments:  Derenda FennelKara [place], [place] [phone]

## 2014-04-30 NOTE — Progress Notes (Signed)
PROGRESS NOTE  CLEVLAND CORK WUJ:[id number] DOB: 11-07-1955 DOA: 04/13/2014 PCP: Pcp Not In System  [place]  Summary: 59 year old man with history of diabetes presented with significant lower extremity edema and facial swelling while on lisinopril. Workup in the emergency department revealed an enlarged heart on chest x-ray, elevated BNP, modestly elevated troponin and tachycardia. Admitted for suspected CHF, cardiology consultation, angioedema, tachycardia. He was seen in consultation with cardiology and started on diuretics and treated for angioedema. Echocardiogram revealed severe systolic and diastolic dysfunction as well as right ventricular dysfunction. The patient developed symptoms suggestive of alcohol withdrawal/DTs and was electively intubated for airway protection. He developed acute renal failure and required dobutamine. Developed atrial flutter. Found to have right lower extremity DVT and started on heparin. Found to have bacteremia and given difficult intubation there was concern for aspiration pneumonia and he was started on antibiotics. Renal function slowly improved. Patient self extubated 3/24 and was reintubated. Angioedema persisted and was treated with FFP. Self extubated 3/26. Heart rate control was difficult. He was transitioned to oral anticoagulants.   Assessment/Plan: 1. Atrial fibrillation, flutter. Rate controlled on current therapy. Continue Eliquis. 2. Acute hypoxic respiratory failure, multifactorial (CHF, atrial flutter, sepsis). Resolved. 3. Acute kidney injury, nonoliguric. Nephrology signed off. Overall improving. Etiology prerenal versus hepatorenal versus cardiorenal versus ATN. 4. Acute systolic/diastolic congestive heart failure, biventricular failure. Remains compensated. Suspicion for alcoholic cardiomyopathy. LVEF 20-25%. PE cannot be excluded. 5. Sepsis secondary to Streptococcus viridans bacteremia. Suspect source dentition, possible pneumonia, aspiration  was considered. Leukocytosis slowly trending down. Discussed with Dr. Orvan Falconer infectious disease, recommended total 2 weeks IV antibiotics. Repeat blood cultures are pending at this time and no growth to date. 6. Ventilatory dependent respiratory failure, multifactorial: CHF, angioedema, delirium tremens. 7. Alcohol abuse with suspected delirium tremens during this hospitalization. Treated with Precedex. 8. Right lower extremity DVT, possible PE. Continue Eliquis. Hemodynamics stable. Cannot rule out pulmonary embolism but given right ventricular systolic dysfunction this is suspected. 9. Angioedema tongue, resolved. Secondary to ACE inhibitor. 10. Suspected cirrhosis, suspect alcoholic in etiology. Hepatitis panel was negative. 11. Normocytic anemia, stable. 12. Hyperglycemia secondary to acute illness. 13. Obesity.  To SNF today  Brendia Sacks, MD  [facility]  Pager [phone] If 7PM-7AM, please contact night-coverage at [email or web address], password [facility] 04/30/2014, 11:55 AM  LOS: 17 days   Consultants:  Nephrology--signed off 4/1  Pulmonology--signed off 3/29  Cardiology  ST: Dysphagia 3 (Mechanical Soft);Thin liquid   PT: SNF  Procedures:  ETT 3/20 >> 3/26  PICC  3/25, 3/26 FFP  Antibiotics:  Zosyn 3/21>>> 3/24  Vancomycin 3/22>>>3/24  Ceftriaxone 3/24 >> 4/6  HPI/Subjective: No complaints today. Feeling fine.   Objective: Filed Vitals:   04/30/14 0012 04/30/14 0251 04/30/14 0624 04/30/14 0659  BP: 120/70 114/78 161/128 104/59  Pulse: 100 54  82  Temp:  99.1 F (37.3 C) 98.4 F (36.9 C)   TempSrc:  Oral Oral   Resp:  16 18   Height:      Weight:   115.44 kg (254 lb 8 oz)   SpO2:  100% 100%     Intake/Output Summary (Last 24 hours) at 04/30/14 1155 Last data filed at 04/30/14 0900  Gross per 24 hour  Intake    480 ml  Output   1200 ml  Net   -720 ml     Filed Weights   04/28/14 0632 04/29/14 0543 04/30/14 0624  Weight: 119.568 kg (263 lb  9.6 oz) 117.663 kg (259 lb  6.4 oz) 115.44 kg (254 lb 8 oz)    Exam:     Afebrile, vitals stable. Heart rate controlled. General:  Appears comfortable, calm. Cardiovascular: Irregular rhythm, normal rate; no murmur, rub or gallop. No left lower extremity edema. Right lower extremity edema unchanged. Telemetry: Atrial flutter rate controlled Respiratory: Clear to auscultation bilaterally, no wheezes, rales or rhonchi. Normal respiratory effort. Skin: Right arm appears unremarkable. Psychiatric: grossly normal mood and affect, speech fluent and appropriate  Data Reviewed:  Weights unreliable but improved  UOP 1200, -10.8 L since admission  Capillary blood sugars stable, no hypoglycemia  WBC 25 >>18 >> 14.7 (suspect steroids)   Hemoglobin stable 8.7 >> 8.2  CXR no acute disease  Hepatitis panel negative  BC 2/2 Strep viridans, sensitive to PCN  Pending data:  Repeat BC no growth to date 4 days  Scheduled Meds: . apixaban  10 mg Oral BID   Followed by  . [START ON 05/01/2014] apixaban  5 mg Oral BID  . cefTRIAXone (ROCEPHIN)  IV  2 g Intravenous Q24H  . feeding supplement (GLUCERNA SHAKE)  237 mL Oral TID BM  . feeding supplement (PRO-STAT SUGAR FREE 64)  30 mL Oral BID  . folic acid  1 mg Per Tube Daily  . insulin aspart  0-9 Units Subcutaneous TID WC  . insulin aspart  3 Units Subcutaneous TID WC  . metoprolol succinate  100 mg Oral BID  . multivitamin with minerals  1 tablet Per Tube Daily  . sodium chloride  10-40 mL Intracatheter Q12H  . sodium chloride  10-40 mL Intracatheter Q12H  . sodium chloride  3 mL Intravenous Q12H  . thiamine  100 mg Oral Daily   Or  . thiamine  100 mg Intravenous Daily  . torsemide  20 mg Oral Daily   Continuous Infusions:    Principal Problem:   Atrial flutter Active Problems:   Acute combined systolic and diastolic CHF, NYHA class 4   DVT (deep venous thrombosis)   AKI (acute kidney injury)   Streptococcus viridans  infection   Bacteremia due to Streptococcus   Hepatic cirrhosis   Alcohol abuse   Angioedema

## 2014-04-30 NOTE — Plan of Care (Signed)
Problem: Phase III Progression Outcomes Goal: Activity at appropriate level-compared to baseline (UP IN CHAIR FOR HEMODIALYSIS)  Outcome: Not Met (add Reason) Patient transferring to short term rehab at [facility] for further PT

## 2014-04-30 NOTE — Discharge Summary (Signed)
Physician Discharge Summary  Gabriel Reynolds BJY:[id number] DOB: 12-16-1955 DOA: 04/13/2014  PCP: Pcp Not In System  Admit date: 04/13/2014 Discharge date: 04/30/2014  Recommendations for Outpatient Follow-up:  1. Follow-up new diagnosis of atrial flutter with difficult rate control. Currently rate controlled on beta blocker. Consider amiodarone if rate increases.  2. Follow-up new diagnosis of systolic and diastolic heart failure, biventricular heart failure. Recommend daily weights. Low salt diet, dysphagia 3 diet. Outpatient follow-up with cardiology has been arranged. No ACE-I or ARB secondary to angioedema and poor renal function. No Aldactone secondary to poor renal function.  3. Ischemic testing will be deferred to the outpatient setting. Cardioversion will be considered after 3-4 weeks of anticoagulation.  4. Plan finish IV antibiotics as below. F/u blood cultures which are no growth today; final blood culture should be available in the next 48 hours, if negative, discontinue antibiotics on timeline as below. If  current blood cultures should become positive would discuss with infectious disease. 5. Suggest repeat basic metabolic panel in 1 week, CBC in 1 week 6. apixaban 10 mg po this evening, change to 5 mg po BID 4/5. 7. Daily weights, monitor volume status 8. Speech therapy for swallowing function. 9. Outpatient follow-up with nephrology 4 weeks 10. Follow blood sugars closely, may need to titrate up on insulin. 11. Foreign body right mid upper arm without complication. See discussion below.   Follow-up Information    Follow up with [facility] S, MD In 4 weeks.   Specialty:  Nephrology   Contact information:   [address] [place] [address] [phone]       Follow up with [name], NP On 05/22/2014.   Specialty:  Nurse Practitioner   Why:  1:10 PM   Contact information:   [address] [place] [address] [phone]      Discharge  Diagnoses:  1. Angioedema of the tongue 2. Delirium tremens, acute alcohol withdrawal 3. Acute kidney injury 4. Acute hypoxic respiratory failure. 5. Sepsis secondary to Streptococcus viridans. 6. Acute systolic/diastolic congestive heart failure 7. Biventricular heart failure 8. Right lower extremity acute DVT 9. Alcohol abuse 10. Normocytic anemia 11. Cirrhosis 12. Foreign-body left mid upper arm without complication.  Discharge Condition: Improved Disposition: Skilled nursing facility for short-term rehabilitation  Diet recommendation: Dysphagia 3 (Mechanical Soft);Thin liquid   Filed Weights   04/28/14 6440 04/29/14 0543 04/30/14 0624  Weight: 119.568 kg (263 lb 9.6 oz) 117.663 kg (259 lb 6.4 oz) 115.44 kg (254 lb 8 oz)    History of present illness:  59 year old man with history of diabetes presented with significant lower extremity edema and facial swelling while on lisinopril. Initial workup suggested acute heart failure, angioedema.   Hospital Course:  He was seen in concert with cardiology and started on diuretics as well as being treated for angioedema. Angioedema progressed and the patient developed respiratory distress secondary to alcohol withdrawal/delirium tremens and respiratory status declined and patient was urgently intubated. He was eventually successfully extubated and angioedema resolved with aggressive treatment including FFP. Echocardiogram revealed severe systolic and diastolic dysfunction as well as right ventricular dysfunction. He was discovered to have a right lower extremity DVT and pulmonary embolism was presumed. He was started on anticoagulation. Because of his critical illness no imaging of the chest was performed (because of renal failure no CT chest (. Hemodynamics subsequently stabilized. He developed acute renal failure and required dobutamine infusion, was seen in concert with nephrology with gradual recovery of acute kidney  injury. Hospitalization  was further complicated by Streptococcus viridans bacteremia thought to be either aspiration pneumonia or dental. Also hospitalization complicating by atrial flutter with difficult to control rates. Eventually his condition stabilized and now he is stable for transfer to skilled nursing facility.  1. Atrial fibrillation, flutter. Rate controlled on current therapy. Continue Eliquis. 2. Acute hypoxic respiratory failure, multifactorial (CHF, atrial flutter, sepsis). Resolved. 3. Acute kidney injury, nonoliguric. Nephrology signed off. Overall improving. Etiology prerenal versus hepatorenal versus cardiorenal versus ATN. 4. Acute systolic/diastolic congestive heart failure, biventricular failure. Remains compensated. Suspicion for alcoholic cardiomyopathy. LVEF 20-25%. PE cannot be excluded. 5. Sepsis secondary to Streptococcus viridans bacteremia. Suspect source dentition, possible pneumonia, aspiration was considered. Leukocytosis slowly trending down. Discussed with Dr. Orvan Falconer infectious disease, recommended total 2 weeks IV antibiotics. Repeat blood cultures are pending at this time and no growth to date. 6. Ventilatory dependent respiratory failure, multifactorial: CHF, angioedema, delirium tremens. 7. Alcohol abuse with suspected delirium tremens during this hospitalization. Treated with Precedex. 8. Right lower extremity DVT, possible PE. Continue Eliquis. Hemodynamics stable. Cannot rule out pulmonary embolism but given right ventricular systolic dysfunction this is suspected. 9. Angioedema tongue, resolved. Secondary to ACE inhibitor. 10. Suspected cirrhosis, suspect alcoholic in etiology. Hepatitis panel was negative. 11. Normocytic anemia, stable. 12. Hyperglycemia secondary to acute illness. 13. Obesity. 14. PICC line: Right-sided upper arm PICC line was attempted 3/21. See documentation from that date for full details. In brief guidewire was difficult to remove it was unclear whether  there might be a retained foreign body. Wife is aware of this as it was discussed with her at that time. I reviewed the chest x-rays with Dr. Toniann Fail evidence of FB. Right humerus film reveals small linear FB c/w guidewire in soft tissue of upper arm. No outward abnormalities on exam. Asymptomatic. discussed with Dr. Danie Chandler indication for removal without symptoms/infection. Discussed in detail with patient and wife at bedside. Given that this is causing her problems at this point, and that surgery would be required for removal, will just monitor. Patient and wife agreed.  Consultants:  Nephrology--signed off 4/1  Pulmonology--signed off 3/29  Cardiology  ST: Dysphagia 3 (Mechanical Soft);Thin liquid   PT: SNF  Procedures:  ETT 3/20 >> 3/26  PICC  3/25, 3/26 FFP  Antibiotics:  Zosyn 3/21>>> 3/24  Vancomycin 3/22>>>3/24  Ceftriaxone 3/24 >> 4/6  Discharge Instructions   Current Discharge Medication List    START taking these medications   Details  Amino Acids-Protein Hydrolys (FEEDING SUPPLEMENT, PRO-STAT SUGAR FREE 64,) LIQD Take 30 mLs by mouth 2 (two) times daily.    !! apixaban (ELIQUIS) 5 MG TABS tablet Take 2 tablets (10 mg total) by mouth 2 (two) times daily. Last dose 4/4 PM    !! apixaban (ELIQUIS) 5 MG TABS tablet Take 1 tablet (5 mg total) by mouth 2 (two) times daily. Start April 5th in the morning.    cefTRIAXone 2 g in dextrose 5 % 50 mL Inject 2 g into the vein daily. Last dose April 6th.    feeding supplement, GLUCERNA SHAKE, (GLUCERNA SHAKE) LIQD Take 237 mLs by mouth 3 (three) times daily between meals.    folic acid (FOLVITE) 1 MG tablet Take 1 tablet (1 mg total) by mouth daily.    metoprolol succinate (TOPROL-XL) 100 MG 24 hr tablet Take 1 tablet (100 mg total) by mouth 2 (two) times daily. Take with or immediately following a meal.    Multiple Vitamin (MULTIVITAMIN WITH MINERALS) TABS tablet  Place 1 tablet into feeding tube daily.      thiamine 100 MG tablet Take 1 tablet (100 mg total) by mouth daily.    torsemide (DEMADEX) 20 MG tablet Take 1 tablet (20 mg total) by mouth daily.     !! - Potential duplicate medications found. Please discuss with provider.    CONTINUE these medications which have CHANGED   Details  insulin detemir (LEVEMIR) 100 UNIT/ML injection Inject 0.05 mLs (5 Units total) into the skin daily.      STOP taking these medications     metFORMIN (GLUCOPHAGE) 500 MG tablet      PRESCRIPTION MEDICATION        Allergies  Allergen Reactions  . Ace Inhibitors Swelling  . Other Hives    wool    The results of significant diagnostics from this hospitalization (including imaging, microbiology, ancillary and laboratory) are listed below for reference.    Significant Diagnostic Studies: Ct Abdomen Pelvis Wo Contrast  04/15/2014   CLINICAL DATA:  Initial evaluation for abdominal pain with concern for renal obstruction or small bowel obstruction as well as ascites, personal history of diabetes and hypertension  EXAM: CT ABDOMEN AND PELVIS WITHOUT CONTRAST  TECHNIQUE: Multidetector CT imaging of the abdomen and pelvis was performed following the standard protocol without IV contrast.  COMPARISON:  04/14/2014  FINDINGS: There is a small volume of ascites in the abdomen and pelvis. Liver is normal as is the gallbladder. The spleen is normal. Pancreas appears normal. There are peripancreatic lymph nodes in the region of the portal vein. There is suggestion of mild inflammatory change around the pancreas. There is also possibly mild inflammatory change inferior to the pancreas around the duodenum.  Adrenal glands are normal. Kidneys are normal. There are no renal or ureteral calculi. There is no evidence of hydronephrosis. There is moderate bilateral perinephric inflammatory change.  Reproductive organs are normal. Bladder is contracted and mildly hyper attenuating. There is mild inflammatory change around the  bladder.  There is mild calcification of the abdominal aorta. There is a nonobstructive bowel gas pattern. There are no abnormally dilated loops of small or large bowel. Appendix appears nondilated. Stomach is normal.  There are no acute musculoskeletal findings. There is coronary artery calcification. There is mild cardiac enlargement. There is trace left pleural fluid. There is a small right pleural effusion. There is mildly increased attenuation in the body wall suggesting possibility of mild anasarca.  IMPRESSION: Mild ascites in the abdomen and pelvis. Additionally, there is possible inflammatory change in the region of the pancreas that could indicate pancreatitis. There is also inflammatory change in the perinephric space bilaterally as well as around the bladder. Cystitis with pyelonephritis not excluded.  Bladder is essentially entirely decompressed and appears somewhat hyper attenuating. This may reflect concentrated urine. Correlate for any evidence of hematuria however.  Small pleural effusions   Electronically Signed   By: Esperanza Heir M.D.   On: 04/15/2014 18:04   Dg Chest 1 View  04/19/2014   CLINICAL DATA:  Endotracheal and NG tube placement. Patient pulled endotracheal tube.  EXAM: CHEST  1 VIEW  COMPARISON:  Chest radiograph earlier this day at 1619 hour  FINDINGS: The endotracheal tube is high, 12.9 cm from the carina above the thoracic inlet. Advancement of at least 5 cm recommended. Enteric tube is in place, tip and side port below the diaphragm. Lung volumes remain low. Left upper extremity PICC remains in place in the proximal SVC. Cardiomegaly, vascular congestion,  and bibasilar atelectasis are unchanged from prior. No large pleural effusion or pneumothorax.  IMPRESSION: Endotracheal tube is high in position, 12.9 cm from the carina. Advancement of at least 5 cm recommended.   Electronically Signed   By: Rubye Oaks M.D.   On: 04/19/2014 00:13   Dg Chest 1 View  04/15/2014    CLINICAL DATA:  Endotracheal tube placement.  Initial encounter.  EXAM: CHEST  1 VIEW  COMPARISON:  Chest radiograph performed earlier today at 4:17 p.m.  FINDINGS: The patient's endotracheal tube is seen ending 4 cm above the carina.  The lungs are hypoexpanded. Vascular crowding and vascular congestion are seen. Mild left-sided opacity likely reflects atelectasis. No definite pleural effusion or pneumothorax is seen.  The cardiomediastinal silhouette is borderline enlarged. No acute osseous abnormalities are identified.  IMPRESSION: 1. Endotracheal tube seen ending 4 cm above the carina. 2. Lungs hypoexpanded. Vascular congestion and borderline cardiomegaly noted. Mild left-sided airspace opacity likely reflects atelectasis.   Electronically Signed   By: Roanna Raider M.D.   On: 04/15/2014 20:19   Dg Chest 2 View  04/13/2014   CLINICAL DATA:  Facial and bilateral leg swelling for 2 weeks.  EXAM: CHEST  2 VIEW  COMPARISON:  01/11/2011  FINDINGS: Mild cardiomegaly, which has developed since previous. Aortic and hilar contours are stable.  No edema or convincing effusion. There is chronic coarsening of lung markings, especially at the right base. No pneumonia or pneumothorax.  IMPRESSION: 1. Cardiomegaly which has developed since 2012. 2. No edema or pneumonia.   Electronically Signed   By: Marnee Spring M.D.   On: 04/13/2014 12:18   US Venous Img Lower Bilateral  04/16/2014   CLINICAL DATA:  Bilateral leg and foot swelling for 2 weeks. Evaluate for DVT. The patient is currently on the ventilator.  EXAM: BILATERAL LOWER EXTREMITY VENOUS DOPPLER ULTRASOUND  TECHNIQUE: Gray-scale sonography with graded compression, as well as color Doppler and duplex ultrasound were performed to evaluate the lower extremity deep venous systems from the level of the common femoral vein and including the common femoral, femoral, profunda femoral, popliteal and calf veins including the posterior tibial, peroneal and gastrocnemius  veins when visible. The superficial great saphenous vein was also interrogated. Spectral Doppler was utilized to evaluate flow at rest and with distal augmentation maneuvers in the common femoral, femoral and popliteal veins.  COMPARISON:  None.  FINDINGS: RIGHT LOWER EXTREMITY  Common Femoral Vein: No evidence of thrombus. Normal compressibility.  Saphenofemoral Junction: No evidence of thrombus. Normal compressibility.  Profunda Femoral Vein: No evidence of thrombus. Normal compressibility.  Femoral Vein: No evidence of thrombus.  Normal compressibility.  Popliteal Vein: No evidence of thrombus.  Normal compressibility.  Calf Veins: There is hypoechoic expansile occlusive thrombus within the posterior tibial vein (representative images 38 through 42).  Superficial Great Saphenous Vein: No evidence of thrombus. Normal compressibility and flow on color Doppler imaging.  Venous Reflux:  None.  Other Findings: A minimal on subcutaneous edema is noted at the level of the calf and lower leg.  LEFT LOWER EXTREMITY  Common Femoral Vein: No evidence of thrombus. Normal compressibility, respiratory phasicity and response to augmentation.  Saphenofemoral Junction: No evidence of thrombus. Normal compressibility.  Profunda Femoral Vein: No evidence of thrombus. Normal compressibility.  Femoral Vein: No evidence of thrombus.  Normal compressibility.  Popliteal Vein: No evidence of thrombus. Slow flow is demonstrated within the left popliteal vein (image 81) however the vessel remains normally compressible at this  location (representative image 84).  Calf Veins: There is hypoechoic nonocclusive thrombus within 1 of the paired left posterior tibial veins (image 99 through 101).  Superficial Great Saphenous Vein: No evidence of thrombus. Normal compressibility and flow on color Doppler imaging.  Venous Reflux:  None.  Other Findings: There is age-indeterminate occlusive superficial thrombophlebitis involving the lesser saphenous  vein (Images 105 through portal 7). A minimal amount of subcutaneous edema is noted at the level of the left calf and ankle.  IMPRESSION: 1. Examination positive for DVT involving the right posterior tibial vein and one of the paired left posterior tibial veins, however there is no extension of this distal DVT to involve the more proximal deep venous systems of either lower extremity. 2. Examination is positive for age-indeterminate occlusive superficial thrombophlebitis involving the left lesser saphenous vein.   Electronically Signed   By: Simonne Come M.D.   On: 04/16/2014 14:12   Dg Chest Port 1 View  04/26/2014   CLINICAL DATA:  Shortness of breath and edema  EXAM: PORTABLE CHEST - 1 VIEW  COMPARISON:  04/22/2014  FINDINGS: Cardiac shadow is mildly enlarged but stable. A left PICC line is again seen in the mid superior vena cava. No focal infiltrate or sizable effusion is seen. No bony abnormality is noted.  IMPRESSION: No acute abnormality seen.   Electronically Signed   By: Alcide Clever M.D.   On: 04/26/2014 09:47   Dg Chest Port 1 View  04/22/2014   CLINICAL DATA:  Shortness of breath, extubated  EXAM: PORTABLE CHEST - 1 VIEW  COMPARISON:  04/19/2014  FINDINGS: Endotracheal tube has been removed. Mild enlargement of the cardiac silhouette is noted without evidence for edema. Trace bilateral pleural effusions are noted with persistent retrocardiac opacity. Right lung is grossly clear.  IMPRESSION: Persistent hypoaeration with trace bilateral pleural effusions and retrocardiac opacity.   Electronically Signed   By: Christiana Pellant M.D.   On: 04/22/2014 09:21   Dg Chest Port 1 View  04/19/2014   CLINICAL DATA:  Difficult intubation, endotracheal tube placement  EXAM: PORTABLE CHEST - 1 VIEW  COMPARISON:  04/18/2014 at 2330 hour  FINDINGS: The endotracheal tube has been advanced and is now 8 cm from the carina. Enteric tube and left upper extremity PICC remain in place. Cardiomegaly, mild vascular  congestion, and bibasilar atelectasis are unchanged.  IMPRESSION: Endotracheal tube has been advanced and is now 8 cm from the carina.   Electronically Signed   By: Rubye Oaks M.D.   On: 04/19/2014 01:28   Dg Chest Port 1 View  04/18/2014   CLINICAL DATA:  Left PICC line placement.  Intubated.  EXAM: PORTABLE CHEST - 1 VIEW  COMPARISON:  04/18/2014  FINDINGS: Left PICC line tip is obscured over the spine but appears to extend into the proximal SVC level. Exam is rotated to the left. Endotracheal to 10.2 cm above the carina. NG tube in the stomach. Stable cardiomegaly with vascular congestion and basilar atelectasis. Low lung volumes persist. No enlarging effusion or pneumothorax.  IMPRESSION: Left PICC line tip proximal SVC level.  Stable chest exam.   Electronically Signed   By: Judie Petit.  Shick M.D.   On: 04/18/2014 16:47   Dg Chest Port 1 View  04/18/2014   CLINICAL DATA:  59 year old male with PICC line placement. Subsequent encounter.  EXAM: PORTABLE CHEST - 1 VIEW  COMPARISON:  04/17/2014.  FINDINGS: Left-sided PICC line is in place. The tip is difficult to adequately assess but  appears to be at the level of formation of the superior vena cava.  Endotracheal tube tip is 6.5 cm above the carina.  Nasogastric tube courses below the diaphragm. Tip is not included on the present exam.  Cardiomegaly.  Pulmonary vascular prominence most notable centrally.  Slightly limited evaluation of the lung bases secondary to degree of inspiration.  Tortuous aorta.  Bilateral acromioclavicular joint degenerative changes.  IMPRESSION: Left-sided PICC line is in place. The tip is difficult to adequately assess but appears to be at the level of formation of the superior vena cava.  Cardiomegaly.  Pulmonary vascular prominence most notable centrally.   Electronically Signed   By: Lacy Duverney M.D.   On: 04/18/2014 10:22   Dg Chest Port 1 View  04/17/2014   ADDENDUM REPORT: 04/17/2014 08:14  ADDENDUM: PICC tip is in the  superior vena cava at the level of the azygos vein, retracted since the prior study.   Electronically Signed   By: Francene Boyers M.D.   On: 04/17/2014 08:14   04/17/2014   CLINICAL DATA:  Respiratory failure.  EXAM: PORTABLE CHEST - 1 VIEW  COMPARISON:  04/16/2014 and 04/15/2014 and 04/13/2014  FINDINGS: Endotracheal tube is 5.5 cm above the carina. NG tube tip is in the body of the stomach. Heart size and vascularity are normal considering the shallow inspiration. There is slight consolidation at the left lung base medially. Minimal atelectasis at the right lung base due to the shallow inspiration.  IMPRESSION: Focal area of consolidation at the left lung base, unchanged.  Electronically Signed: By: Francene Boyers M.D. On: 04/17/2014 07:09   Dg Chest Port 1 View  04/16/2014   CLINICAL DATA:  PICC placement  EXAM: PORTABLE CHEST - 1 VIEW  COMPARISON:  04/15/2014  FINDINGS: New left upper extremity PICC, tip in the region of the upper right atrium. An endotracheal tube tip remains at the clavicular heads. Gastric suction tube reaches the stomach.  Unchanged cardiopericardial enlargement. Increased pulmonary venous congestion. The retrocardiac lung remains largely obscured by soft tissue attenuation. No effusion or pneumothorax.  IMPRESSION: 1. New left upper extremity PICC, tip at the upper right atrium. 2. Cardiomegaly and increased pulmonary venous congestion.   Electronically Signed   By: Marnee Spring M.D.   On: 04/16/2014 16:46   Dg Chest Port 1 View  04/15/2014   CLINICAL DATA:  CHF  EXAM: PORTABLE CHEST - 1 VIEW  COMPARISON:  04/13/2014  FINDINGS: Lungs are clear. No frank interstitial edema. No pleural effusion or pneumothorax.  Cardiomegaly.  IMPRESSION: No evidence of acute cardiopulmonary disease.   Electronically Signed   By: Charline Bills M.D.   On: 04/15/2014 16:46   Dg Humerus Right  04/30/2014   CLINICAL DATA:  Right arm bruising and pain. Recent attempted PICC line placement with  broken guidewire and possible foreign body.  EXAM: RIGHT HUMERUS - 2+ VIEW  COMPARISON:  None.  FINDINGS: There is no evidence of fracture or other focal bone lesions.  A small linear radiopaque foreign body measuring approximately 4 mm in length is seen in the anterior and medial soft tissues along the mid to distal humeral diaphysis.  IMPRESSION: 4 mm linear radiopaque foreign body in anterior and medial soft tissues at the level of the mid to distal humeral diaphysis.  No osseous abnormality.   Electronically Signed   By: Myles Rosenthal M.D.   On: 04/30/2014 12:19   US Abdomen Limited Ruq  04/14/2014   CLINICAL DATA:  Ethanol abuse  EXAM: US ABDOMEN LIMITED - RIGHT UPPER QUADRANT  COMPARISON:  None.  FINDINGS: Gallbladder:  No gallstones or wall thickening visualized. No sonographic Murphy sign noted.  Common bile duct:  Diameter: Normal diameter at 4 mm.  Liver:  There is uniform increase in liver echogenicity. No biliary duct dilatation. Fine nodular contour to the liver. No ascites.  IMPRESSION: 1. Normal gallbladder. 2. Echogenic liver with fine nodular contour suggests mild cirrhosis. 3. No ascites.   Electronically Signed   By: Genevive BiStewart  Edmunds M.D.   On: 04/14/2014 13:18    Microbiology: Recent Results (from the past 240 hour(s))  Culture, blood (routine x 2)     Status: None (Preliminary result)   Collection Time: 04/26/14  1:49 PM  Result Value Ref Range Status   Specimen Description RIGHT ANTECUBITAL  Final   Special Requests BOTTLES DRAWN AEROBIC AND ANAEROBIC 6CC  Final   Culture NO GROWTH 4 DAYS  Final   Report Status PENDING  Incomplete  Culture, blood (routine x 2)     Status: None (Preliminary result)   Collection Time: 04/26/14  3:27 PM  Result Value Ref Range Status   Specimen Description BLOOD PICC LINE  Final   Special Requests BOTTLES DRAWN AEROBIC AND ANAEROBIC 6CC  Final   Culture NO GROWTH 4 DAYS  Final   Report Status PENDING  Incomplete     Labs: Basic Metabolic  Panel:  Recent Labs Lab 04/24/14 0426 04/25/14 0659 04/26/14 1055 04/27/14 0704  NA 146* 147* 142 141  K 3.6 4.1 3.7 4.0  CL 104 107 102 101  CO2 31 31 32 31  GLUCOSE 95 68* 112* 212*  BUN 96* 91* 75* 67*  CREATININE 1.77* 1.53* 1.55* 1.56*  CALCIUM 9.6 9.7 8.9 8.9   CBC:  Recent Labs Lab 04/25/14 0659 04/26/14 1055 04/27/14 0704  WBC 25.0* 18.0* 14.7*  HGB 9.3* 8.7* 8.2*  HCT 27.8* 25.8* 24.7*  MCV 96.5 96.6 96.5  PLT 196 180 175     Recent Labs  04/13/14 1110  BNP 617.0*    CBG:  Recent Labs Lab 04/29/14 1148 04/29/14 1710 04/29/14 2049 04/30/14 0746 04/30/14 1204  GLUCAP 199* 156* 149* 187* 188*    Principal Problem:   Atrial flutter Active Problems:   Acute combined systolic and diastolic CHF, NYHA class 4   DVT (deep venous thrombosis)   AKI (acute kidney injury)   Streptococcus viridans infection   Bacteremia due to Streptococcus   Hepatic cirrhosis   Alcohol abuse   Angioedema   Foreign body (FB) in soft tissue   Time coordinating discharge: 45 minutes  Signed:  Brendia Sacksaniel Lemmie Vanlanen, MD [facility] 04/30/2014, 12:21 PM

## 2014-05-01 LAB — CULTURE, BLOOD (ROUTINE X 2)
Culture: NO GROWTH
Culture: NO GROWTH

## 2014-05-01 NOTE — Care Management Utilization Note (Signed)
UR completed 

## 2014-05-01 NOTE — Care Management Note (Signed)
Discharge summary fax[facility] to [facility]

## 2014-05-02 ENCOUNTER — Non-Acute Institutional Stay (SKILLED_NURSING_FACILITY): Payer: Medicare Other | Admitting: Internal Medicine

## 2014-05-02 DIAGNOSIS — I824Z1 Acute embolism and thrombosis of unspecified deep veins of right distal lower extremity: Secondary | ICD-10-CM | POA: Diagnosis not present

## 2014-05-02 DIAGNOSIS — I2699 Other pulmonary embolism without acute cor pulmonale: Secondary | ICD-10-CM

## 2014-05-02 DIAGNOSIS — I420 Dilated cardiomyopathy: Secondary | ICD-10-CM

## 2014-05-02 DIAGNOSIS — I483 Typical atrial flutter: Secondary | ICD-10-CM

## 2014-05-02 MED ORDER — ETOMIDATE 2 MG/ML IV SOLN: INTRAVENOUS | Status: DC | PRN

## 2014-05-02 MED ORDER — ETOMIDATE 2 MG/ML IV SOLN
INTRAVENOUS | Status: DC | PRN
  Administered 2014-04-15 (×2): 10 mg via INTRAVENOUS

## 2014-05-02 NOTE — Progress Notes (Signed)
Patient ID: MODESTO GANOE, male   DOB: 1955/02/22, 59 y.o.   MRN: [id number]  Facility; [facility] Chief complaint; admission to SNF post admit to [facility] from 3/18 to 4/4  History; this is a 59 year old man who has a history of diabetes and hypertension per him no known prior history of heart disease. He presented with generalized edema and facial swelling in this setting of being on an ACE inhibitor. He was noted have an enlarged heart on chest x-ray. His BNP was 617 and an initial troponin of 0.09. The patient was also tachycardic care with a pulse in the 120s. He apparently went into multifocal factorial respiratory failure requiring intubation including alcohol withdrawal/DTs, angioedema, congestive heart failure. The angioedema apparently required fresh frozen plasma. His echocardiogram showed severe biventricular dysfunction with an EF of 25%. He was also discovered to have a right lower extremity DVT and was felt to have a presumed pulmonary embolism which showed could not be verified on imaging for other medical issues. He developed strep viridans bacteremia felt to be secondary to either aspiration pneumonia or dental he had atrial flutter which was difficult to control.  I think he has predominantly here for completion of 2 weeks of IV antibiotics. Repeat blood cultures are pending at the time he was sent here which I will review.  He was also felt to have cirrhosis likely secondary to alcohol. His hepatitis panel was negative.  Is also noted to have retained guidewire in his right arm from a? Failed PICC line attempt on 3/21. This was not removed I think because there were 200 many other medical issues.  Past Medical History  Diagnosis Date  . Hypertension   . Diabetes mellitus   . Acute combined systolic and diastolic CHF, NYHA class 4 04/13/2014  . Angioedema 04/28/2014  . Alcohol abuse 04/28/2014  . Atrial flutter 04/28/2014  . Bacteremia due to Streptococcus 04/28/2014  . DVT (deep  venous thrombosis)   . Hepatic cirrhosis 04/28/2014  . Streptococcus viridans infection 04/28/2014  . Foreign body (FB) in soft tissue 04/30/2014    Right upper arm soft tissue, failed PICC line placement, retained guide-wire fragment.    Past Surgical History  Procedure Laterality Date  . Hand surgery      Social history; patient states he lives in [place] with his wife and was independent with ADLs and IADLs before admission to hospital. Still driving. He says he is not working secondary to his diabetes [question disability]. States he gets his healthcare through the [place] in [facility]. States he gets his medications through the [place] which is important since he is on a large number of important medications including Levemir at this point.  reports that he has never smoked. He has never used smokeless tobacco. He reports that he drinks about 4.2 oz of alcohol per week. He reports that he does not use illicit drugs. Family history; patient does not relate any positive relevant family history.  Current Outpatient Prescriptions on File Prior to Visit  Medication Sig Dispense Refill  . Amino Acids-Protein Hydrolys (FEEDING SUPPLEMENT, PRO-STAT SUGAR FREE 64,) LIQD Take 30 mLs by mouth 2 (two) times daily.    Marland Kitchen apixaban (ELIQUIS) 5 MG TABS tablet Take 2 tablets (10 mg total) by mouth 2 (two) times daily. Last dose 4/4 PM    . apixaban (ELIQUIS) 5 MG TABS tablet Take 1 tablet (5 mg total) by mouth 2 (two) times daily. Start April 5th in the morning.    Marland Kitchen  cefTRIAXone 2 g in dextrose 5 % 50 mL Inject 2 g into the vein daily. Last dose April 6th.    . feeding supplement, GLUCERNA SHAKE, (GLUCERNA SHAKE) LIQD Take 237 mLs by mouth 3 (three) times daily between meals.    . folic acid (FOLVITE) 1 MG tablet Take 1 tablet (1 mg total) by mouth daily.    . insulin detemir (LEVEMIR) 100 UNIT/ML injection Inject 0.05 mLs (5 Units total) into the skin daily.    . metoprolol succinate (TOPROL-XL) 100 MG 24  hr tablet Take 1 tablet (100 mg total) by mouth 2 (two) times daily. Take with or immediately following a meal.    . Multiple Vitamin (MULTIVITAMIN WITH MINERALS) TABS tablet Place 1 tablet into feeding tube daily.    Marland Kitchen thiamine 100 MG tablet Take 1 tablet (100 mg total) by mouth daily.    Marland Kitchen torsemide (DEMADEX) 20 MG tablet Take 1 tablet (20 mg total) by mouth daily.      Review of systems; Gen; patient states he was not ill prior to his presentation. Had no functional limitations Respiratory; no shortness of breath, no cough, Cardiac; no exertional chest pain, no palpitations GI; no abnormal pain, no change in bowel habits GU; no dysuria, no voiding difficulties Musculoskeletal; no joint pain  Physical examination; Gen. patient is not in any acute distress. Does present that is somewhat lethargic and vague Vital signs; weight yesterday was 273.9, today at 272.1. Temperature 98.4 pulse 75 and feels fairly regular respirations 18 blood pressure 101/52 O2 sat is 96%. HEENT; tongue now looks normal I could not see the back of his throat Neck; perhaps a small goiter not easy to feel Respiratory; shallow but otherwise clear entry Cardiac; heart sounds sound regular there is no murmurs no S3 is JVP is 4 cm at 45 there is no coccyx edema Abdomen; no liver no spleen no obvious ascites no stigmata GU bladder not overtly distended. Extremities there is some swelling of the right leg versus the left. I suspect this is is residual DVT. Peripheral pulses are difficult to feel but I think there is something in the left foot I cannot feel the right. Neurologic; he does have antigravity strength but his legs are bilaterally weak. Reflexes are 1+ at the knee jerks absent at the ankle jerks both toes are downgoing. There is right pronator drift. Mental status; he could answer most of my questions although I found him to be slow and vague.   Impression/plan 1; severe biventricular dysfunction. I think he  had a cardiac cath given the bruising in his right lower extremity. A stone the discharge summary I am not exactly sure whether this was felt to be all ischemic or not. This could be alcoholic cardiomyopathy, tachycardia induced cardiomyopathy etc. Currently his volume status appears to be stable he is on Demadex 20 mg I think this is reasonable we will follow his weights which have so far been stable. Although I see a statement that ischemic testing would be done as an outpatient. #2 atrial flutter; currently his pulse rate is in the 70s and feels fairly regular. He is apparently a plan for cardioversion after 3-4 weeks of anticoagulation.. #3 DVT and a possible pulmonary embolism on Eliquis. Right leg is still swollen all follow this clinically. #4 type 2 diabetes now on a small dose of insulin. I suspect he probably has diabetic neuropathy and diabetic PAD. #5 retained foreign body/guidewire in his right arm does not appear to be  in overt clinical problem here. #6 I'm concerned about his mental status. His response to my questions was extremely vague. Would wonder about alcohol-induced dementia although I'd like to follow this #7 listed as having cirrhosis of the liver presumably alcohol induced and presumably on imaging of the abdomen studies. Apparently his hepatitis studies were negative for. #8 extensive alcohol abuse likely I will need to have some discussion with him and his wife at some point to ensure abstinence as best we can. He did have DTs and the alcohol may have something to do with his cardiomyopathy, cognitive impairment and undoubtedly his cirrhosis #9 angioedema of his tongue which apparently was very difficult to control secondary to ace inhibitors #10 acute renal failure this will need to be rechecked. 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  The patient tells me he had some of his primary care through the [place]. Certainly [facility] might help this man with  some of the issues he is likely to face down the road depending on his connection status. I will attempt to do a more complete neurologic exam on him, I'll check if he had neuro imaging.   Transthoracic Echocardiography  Patient:    Mckoy, Bhakta MR #:       [id number] Study Date: 04/14/2014 Gender:     M Age:        59 Height:     185.4 cm Weight:     136.1 kg BSA:        2.7 m^2 Pt. Status: Room:       APA09   Malon Kindle  REFERRING    Jerald Kief  SONOGRAPHER  Danford Bad, RCS  ATTENDING    Wilkie Aye, Mayer Masker  PERFORMING   [facility], Jeani Hawking  cc:  ------------------------------------------------------------------- LV EF: 20% -   25%  ------------------------------------------------------------------- Indications:      428.0 CHF.  ------------------------------------------------------------------- Study Conclusions  - Left ventricle: The cavity size was normal. There was moderate   concentric hypertrophy. Systolic function was severely reduced.   The estimated ejection fraction was in the range of 20% to 25%.   Wall motion was normal; there were no regional wall motion   abnormalities. Doppler parameters are consistent with a   restrictive pattern, indicative of decreased left ventricular   diastolic compliance and/or increased left atrial pressure (grade   3 diastolic dysfunction). Doppler parameters are consistent with   elevated ventricular end-diastolic filling pressure. - Ventricular septum: Septal motion showed paradox. - Aortic valve: Trileaflet; mildly thickened, mildly calcified   leaflets. Transvalvular velocity was within the normal range.   There was no stenosis. There was no regurgitation. Valve area   (Vmax): 2.29 cm^2. - Mitral valve: Structurally normal valve. There was mild   regurgitation. - Left atrium: The atrium was severely dilated. - Right ventricle: Systolic function was moderately to severely   reduced. - Right  atrium: The atrium was mildly dilated. - Tricuspid valve: There was mild regurgitation. - Pulmonary arteries: Systolic pressure was within the normal   range. - Inferior vena cava: The vessel was dilated. The respirophasic   diameter changes were blunted (< 50%), consistent with elevated   central venous pressure.  Impressions:  - The left ventricular systolic function is severely decreased with   diffuse hypokinesis and paradoxical septal motion.   There is restrictive pattern of diastolic dysfunction with   elevated filling pressures.   Left atrium is severely dilated.  Right ventricular systolic function is moderately to severely   reduced. RVSP is most probably underestimated secondary to RV   dysfunction.   IVC is dilated.       CLINICAL DATA:  Initial evaluation for abdominal pain with concern for renal obstruction or small bowel obstruction as well as ascites, personal history of diabetes and hypertension   EXAM: CT ABDOMEN AND PELVIS WITHOUT CONTRAST   TECHNIQUE: Multidetector CT imaging of the abdomen and pelvis was performed following the standard protocol without IV contrast.   COMPARISON:  04/14/2014   FINDINGS: There is a small volume of ascites in the abdomen and pelvis. Liver is normal as is the gallbladder. The spleen is normal. Pancreas appears normal. There are peripancreatic lymph nodes in the region of the portal vein. There is suggestion of mild inflammatory change around the pancreas. There is also possibly mild inflammatory change inferior to the pancreas around the duodenum.   Adrenal glands are normal. Kidneys are normal. There are no renal or ureteral calculi. There is no evidence of hydronephrosis. There is moderate bilateral perinephric inflammatory change.   Reproductive organs are normal. Bladder is contracted and mildly hyper attenuating. There is mild inflammatory change around the bladder.   There is mild calcification of the  abdominal aorta. There is a nonobstructive bowel gas pattern. There are no abnormally dilated loops of small or large bowel. Appendix appears nondilated. Stomach is normal.   There are no acute musculoskeletal findings. There is coronary artery calcification. There is mild cardiac enlargement. There is trace left pleural fluid. There is a small right pleural effusion. There is mildly increased attenuation in the body wall suggesting possibility of mild anasarca.   IMPRESSION: Mild ascites in the abdomen and pelvis. Additionally, there is possible inflammatory change in the region of the pancreas that could indicate pancreatitis. There is also inflammatory change in the perinephric space bilaterally as well as around the bladder. Cystitis with pyelonephritis not excluded.   Bladder is essentially entirely decompressed and appears somewhat hyper attenuating. This may reflect concentrated urine. Correlate for any evidence of hematuria however.   Small pleural effusions     Electronically Signed   By: Esperanza Heir M.D.   On: 04/15/2014 18:04       Results for KAIDON, KINKER (MRN [id number]) as of 05/02/2014 09:00  Ref. Range 04/25/2014 06:59 04/26/2014 10:55 04/26/2014 13:49 04/26/2014 15:27 04/27/2014 07:04  Sodium Latest Range: 135-145 mmol/L 147 (H) 142   141  Potassium Latest Range: 3.5-5.1 mmol/L 4.1 3.7   4.0  Chloride Latest Range: 96-112 mmol/L 107 102   101  CO2 Latest Range: 19-32 mmol/L 31 32   31  BUN Latest Range: 6-23 mg/dL 91 (H) 75 (H)   67 (H)  Creatinine Latest Range: 0.50-1.35 mg/dL 4.09 (H) 8.11 (H)   9.14 (H)  Calcium Latest Range: 8.4-10.5 mg/dL 9.7 8.9   8.9  GFR calc non Af Amer Latest Range: >90 mL/min 47 (L) 47 (L)   46 (L)  GFR calc Af Amer Latest Range: >90 mL/min 55 (L) 54 (L)   54 (L)  Glucose Latest Range: 70-99 mg/dL 68 (L) 782 (H)   956 (H)  Anion gap Latest Range: 5-15  9 8   9   WBC Latest Range: 4.0-10.5 K/uL 25.0 (H) 18.0 (H)   14.7 (H)  RBC  Latest Range: 4.22-5.81 MIL/uL 2.88 (L) 2.67 (L)   2.56 (L)  Hemoglobin Latest Range: 13.0-17.0 g/dL 9.3 (L) 8.7 (L)   8.2 (  L)  HCT Latest Range: 39.0-52.0 % 27.8 (L) 25.8 (L)   24.7 (L)  MCV Latest Range: 78.0-100.0 fL 96.5 96.6   96.5  MCH Latest Range: 26.0-34.0 pg 32.3 32.6   32.0  MCHC Latest Range: 30.0-36.0 g/dL 13.033.5 86.533.7   78.433.2  RDW Latest Range: 11.5-15.5 % 13.2 13.5   13.6  Platelets Latest Range: 150-400 K/uL 196 180   175  CULTURE, BLOOD (ROUTINE X 2) No range found   Rpt Rpt

## 2014-05-07 ENCOUNTER — Encounter (HOSPITAL_COMMUNITY)
Admission: RE | Admit: 2014-05-07 | Discharge: 2014-05-07 | Disposition: A | Discharge status: Home / Self Care | Payer: Medicare Other | Source: Skilled Nursing Facility | Attending: Internal Medicine | Admitting: Internal Medicine

## 2014-05-07 LAB — CBC WITH DIFFERENTIAL/PLATELET
Basophils Absolute: 0 10*3/uL (ref 0.0–0.1)
Basophils Relative: 1 % (ref 0–1)
EOS PCT: 4 % (ref 0–5)
Eosinophils Absolute: 0.2 10*3/uL (ref 0.0–0.7)
HCT: 28.9 % — ABNORMAL LOW (ref 39.0–52.0)
Hemoglobin: 9.4 g/dL — ABNORMAL LOW (ref 13.0–17.0)
Lymphocytes Relative: 33 % (ref 12–46)
Lymphs Abs: 2.2 10*3/uL (ref 0.7–4.0)
MCH: 31.6 pg (ref 26.0–34.0)
MCHC: 32.5 g/dL (ref 30.0–36.0)
MCV: 97.3 fL (ref 78.0–100.0)
Monocytes Absolute: 0.5 10*3/uL (ref 0.1–1.0)
Monocytes Relative: 7 % (ref 3–12)
Neutro Abs: 3.7 10*3/uL (ref 1.7–7.7)
Neutrophils Relative %: 55 % (ref 43–77)
PLATELETS: 161 10*3/uL (ref 150–400)
RBC: 2.97 MIL/uL — ABNORMAL LOW (ref 4.22–5.81)
RDW: 15.1 % (ref 11.5–15.5)
WBC: 6.6 10*3/uL (ref 4.0–10.5)

## 2014-05-07 LAB — BASIC METABOLIC PANEL
ANION GAP: 8 (ref 5–15)
BUN: 28 mg/dL — ABNORMAL HIGH (ref 6–23)
CHLORIDE: 98 mmol/L (ref 96–112)
CO2: 30 mmol/L (ref 19–32)
Calcium: 8.6 mg/dL (ref 8.4–10.5)
Creatinine, Ser: 1.33 mg/dL (ref 0.50–1.35)
GFR calc Af Amer: 65 mL/min — ABNORMAL LOW (ref >90)
GFR calc non Af Amer: 56 mL/min — ABNORMAL LOW (ref >90)
Glucose, Bld: 187 mg/dL — ABNORMAL HIGH (ref 70–99)
POTASSIUM: 3.3 mmol/L — AB (ref 3.5–5.1)
Sodium: 136 mmol/L (ref 135–145)

## 2014-05-10 ENCOUNTER — Encounter (HOSPITAL_COMMUNITY)
Admission: RE | Admit: 2014-05-10 | Discharge: 2014-05-10 | Disposition: A | Discharge status: Home / Self Care | Payer: Medicare Other | Source: Skilled Nursing Facility | Attending: Internal Medicine | Admitting: Internal Medicine

## 2014-05-10 LAB — BASIC METABOLIC PANEL
Anion gap: 5 (ref 5–15)
Anion gap: 7 (ref 5–15)
BUN: 27 mg/dL — ABNORMAL HIGH (ref 6–23)
BUN: 27 mg/dL — AB (ref 6–23)
CALCIUM: 8.8 mg/dL (ref 8.4–10.5)
CHLORIDE: 99 mmol/L (ref 96–112)
CO2: 29 mmol/L (ref 19–32)
CO2: 28 mmol/L (ref 19–32)
Calcium: 8.9 mg/dL (ref 8.4–10.5)
Chloride: 103 mmol/L (ref 96–112)
Creatinine, Ser: 1.22 mg/dL (ref 0.50–1.35)
Creatinine, Ser: 1.35 mg/dL (ref 0.50–1.35)
GFR calc Af Amer: 72 mL/min — ABNORMAL LOW (ref >90)
GFR calc Af Amer: 64 mL/min — ABNORMAL LOW (ref >90)
GFR calc non Af Amer: 62 mL/min — ABNORMAL LOW (ref >90)
GFR calc non Af Amer: 55 mL/min — ABNORMAL LOW (ref >90)
GLUCOSE: 166 mg/dL — AB (ref 70–99)
GLUCOSE: 192 mg/dL — AB (ref 70–99)
POTASSIUM: 4 mmol/L (ref 3.5–5.1)
Potassium: 4 mmol/L (ref 3.5–5.1)
Sodium: 137 mmol/L (ref 135–145)
Sodium: 134 mmol/L — ABNORMAL LOW (ref 135–145)

## 2014-05-14 ENCOUNTER — Encounter (HOSPITAL_COMMUNITY)
Admission: RE | Admit: 2014-05-14 | Discharge: 2014-05-14 | Disposition: A | Discharge status: Home / Self Care | Payer: Medicare Other | Source: Skilled Nursing Facility | Attending: Internal Medicine | Admitting: Internal Medicine

## 2014-05-14 ENCOUNTER — Non-Acute Institutional Stay (SKILLED_NURSING_FACILITY): Payer: Medicare Other | Admitting: Internal Medicine

## 2014-05-14 DIAGNOSIS — I481 Persistent atrial fibrillation: Secondary | ICD-10-CM

## 2014-05-14 DIAGNOSIS — I2699 Other pulmonary embolism without acute cor pulmonale: Secondary | ICD-10-CM

## 2014-05-14 DIAGNOSIS — N189 Chronic kidney disease, unspecified: Secondary | ICD-10-CM | POA: Diagnosis not present

## 2014-05-14 DIAGNOSIS — I4819 Other persistent atrial fibrillation: Secondary | ICD-10-CM

## 2014-05-14 DIAGNOSIS — E1122 Type 2 diabetes mellitus with diabetic chronic kidney disease: Secondary | ICD-10-CM | POA: Diagnosis not present

## 2014-05-14 DIAGNOSIS — I420 Dilated cardiomyopathy: Secondary | ICD-10-CM

## 2014-05-14 LAB — BASIC METABOLIC PANEL
Anion gap: 7 (ref 5–15)
BUN: 29 mg/dL — ABNORMAL HIGH (ref 6–23)
CALCIUM: 9.1 mg/dL (ref 8.4–10.5)
CO2: 28 mmol/L (ref 19–32)
Chloride: 102 mmol/L (ref 96–112)
Creatinine, Ser: 1.21 mg/dL (ref 0.50–1.35)
GFR calc Af Amer: 73 mL/min — ABNORMAL LOW (ref >90)
GFR calc non Af Amer: 63 mL/min — ABNORMAL LOW (ref >90)
Glucose, Bld: 181 mg/dL — ABNORMAL HIGH (ref 70–99)
POTASSIUM: 4 mmol/L (ref 3.5–5.1)
SODIUM: 137 mmol/L (ref 135–145)

## 2014-05-14 NOTE — Progress Notes (Signed)
Patient ID: Gabriel Reynolds, male   DOB: [birth year], 59 y.o.   MRN: [id number]  Facility; [facility] Chief complaint; review of medical issues follow-up congestive heart failure. History; this is a patient I admitted to the building last week. He had a difficult stay at the [facility] from 318 through 4/4. Issues during the hospitalization included angioedema, alcohol withdrawal, acute respiratory failure secondary to a angioedema and severe systolic and diastolic dysfunction as well as right ventricular discomfort function, sepsis secondary to strep viridans. He was also discovered to have a right lower extremity DVT and a presumed pulmonary embolism although because of his clinical status at the time he could not undergo definitive testing. He also developed acute renal failure.  The patient's weight is down 10 pounds since his arrival here down to 263 pounds. Nursing reports that he eats very poorly although the patient denies this.  He has atrial fib flutter rate controlled. Currently on Eliquis for this as well as his presumed pulmonary embolism. His ischemic workup was deferred to an outpatient status however his EF was 20-25%. Differential here included alcoholic cardiomyopathy  Lab Results  Component Value Date   CREATININE 1.35 05/10/2014   CREATININE 1.22 05/10/2014   CREATININE 1.33 05/07/2014   Lab Results  Component Value Date   K 4.0 05/10/2014   CBC Latest Ref Rng 05/07/2014 04/27/2014 04/26/2014  WBC 4.0 - 10.5 K/uL 6.6 14.7(H) 18.0(H)  Hemoglobin 13.0 - 17.0 g/dL 4.0(J9.4(L) 8.1(X8.2(L) 9.1(Y8.7(L)  Hematocrit 39.0 - 52.0 % 28.9(L) 24.7(L) 25.8(L)  Platelets 150 - 400 K/uL 161 175 180        Medications Rocephin 2 g IV once a day which finished on 4/6 Eliquis 5 mg twice a day Folic acid 1 mg daily Levemir 10 units once a day Metoprolol extended release 100 mg twice a day Thiamine 100 mg once a day Torsemide 20 mg once a day  CBGs yesterday were; 173/287/284/294 fairly reflective of  recent blood sugar testing.  Review of systems; weight is down 10 pounds to 263 pounds since his arrival here Respiratory; no shortness of breath Cardiac no complaints of chest pain or palpitations Abdomen; no abdominal pain no nausea vomiting diarrhea GU no dysuria  Physical examination HEENT; mucous membranes are normal Respiratory clear entry bilaterally Cardiac clear A. fib no murmurs no S3 JVP is not elevated he appears to be euvolemic Abdomen; no liver no spleen no tenderness no masses Extremities no edema no evidence of a DVT.  Impression/plan #1 severe systolic and diastolic heart failure not exactly of clear etiology. He is supposed to follow-up with cardiology presumably for scheduling of cardiac cath. This appears to be stable on Demadex 20 mg. He is following up with a basic metabolic panel today #2 atrial fibrillation/flutter; he is rate controlled on Eliquis for this; DVT and presumed pulmonary embolism while in hospital. His TSH was not tested #3 strep viridans sepsis. He has finished his antibiotics and I think his PICC line can probably be removed.  #4 type 2 diabetes with chronic renal failure on insulin. He was on insulin prior to his admission to hospital although I am not clear of the dose. Follow-up BUN and creatinine today. Hemoglobin A1c was in the 5 range in March. #5 poor oral intake per the nursing staff; I have discussed this with him. #6 felt to have cirrhosis of the liver secondary to alcohol. I am uncertain what support this man has to maintain abstinence if any #7 he was  hypokalemic last week at 3.3, I think the potassium can probably be discontinued as mentioned lab work is pending today.

## 2014-05-16 ENCOUNTER — Non-Acute Institutional Stay (SKILLED_NURSING_FACILITY): Payer: Medicare Other | Admitting: Internal Medicine

## 2014-05-16 DIAGNOSIS — K7469 Other cirrhosis of liver: Secondary | ICD-10-CM

## 2014-05-16 DIAGNOSIS — I4892 Unspecified atrial flutter: Secondary | ICD-10-CM

## 2014-05-16 DIAGNOSIS — I82409 Acute embolism and thrombosis of unspecified deep veins of unspecified lower extremity: Secondary | ICD-10-CM

## 2014-05-16 DIAGNOSIS — I5041 Acute combined systolic (congestive) and diastolic (congestive) heart failure: Secondary | ICD-10-CM

## 2014-05-16 NOTE — Progress Notes (Signed)
Patient ID: Gabriel Reynolds, male   DOB: Oct 11, 1955, 59 y.o.   MRN: [id number]      This is a routine visit.  Level of care skilled.  Facility [facility] :   Chief complaint; discharge note  HPI--; this is a 59 year old man who has a history of diabetes and hypertension per him no known prior history of heart disease. He presented with generalized edema and facial swelling in this setting of being on an ACE inhibitor. He was noted have an enlarged heart on chest x-ray. His BNP was 617 and an initial troponin of 0.09. The patient was also tachycardic care with a pulse in the 120s. He apparently went into multifocal factorial respiratory failure requiring intubation including alcohol withdrawal/DTs, angioedema, congestive heart failure. The angioedema apparently required fresh frozen plasma. His echocardiogram showed severe biventricular dysfunction with an EF of 25%. He was also discovered to have a right lower extremity DVT and was felt to have a presumed pulmonary embolism which  could not be verified on imaging for other medical issues. He developed strep viridans bacteremia felt to be secondary to either aspiration pneumonia or dental he had atrial flutter which was difficult to control.  He was here essentially for completion of IV antibiotics.  He was also felt to have cirrhosis likely secondary to alcohol. His hepatitis panel was negative.  Is also noted to have retained guidewire in his right arm from a? Failed PICC line attempt on 3/21. This was not removed I think because there were too many other medical issues.  His stay here is been relatively unremarkable-she has lost about 10 pounds-apparently has a poor appetite this would warrant follow up by primary care provider.  In regards to diabetes he now is on Levemir 18 units daily at bedtime blood sugars appear to be in the mid 100s in the morning this also is the case at noon-somewhat more variable later in the day ranging from the  mid 100s to 200's range I see 315 a couple days ago  Past Medical History  Diagnosis Date  . Hypertension   . Diabetes mellitus   . Acute combined systolic and diastolic CHF, NYHA class 4 04/13/2014  . Angioedema 04/28/2014  . Alcohol abuse 04/28/2014  . Atrial flutter 04/28/2014  . Bacteremia due to Streptococcus 04/28/2014  . DVT (deep venous thrombosis)   . Hepatic cirrhosis 04/28/2014  . Streptococcus viridans infection 04/28/2014  . Foreign body (FB) in soft tissue 04/30/2014    Right upper arm soft tissue, failed PICC line placement, retained guide-wire fragment.    Past Surgical History  Procedure Laterality Date  . Hand surgery      Social history; patient states he lives in [place] with his wife and was independent with ADLs and IADLs before admission to hospital. Still driving. He says he is not working secondary to his diabetes [question disability]. States he gets his healthcare through the [place] in [facility]. States he gets his medications through the [place] which is important since he is on a large number of important medications including Levemir at this point.  reports that he has never smoked. He has never used smokeless tobacco. He reports that he drinks about 4.2 oz of alcohol per week. He reports that he does not use illicit drugs. Family history; patient does not relate any positive relevant family history.  Current Outpatient Prescriptions on File Prior to Visit  Medication Sig Dispense Refill  . Amino Acids-Protein Hydrolys (FEEDING SUPPLEMENT, PRO-STAT  SUGAR FREE 64,) LIQD Take 30 mLs by mouth 2 (two) times daily.    Marland Kitchen. apixaban (ELIQUIS) 5 MG TABS tablet Take 2 tablets (10 mg total) by mouth 2 (two) times daily. Last dose 4/4 PM    . apixaban (ELIQUIS) 5 MG TABS tablet Take 1 tablet (5 mg total) by mouth 2 (two) times daily. Start April 5th in the morning.    .       . feeding supplement, GLUCERNA SHAKE, (GLUCERNA SHAKE) LIQD Take 237 mLs by mouth 3 (three) times  daily between meals.    . folic acid (FOLVITE) 1 MG tablet Take 1 tablet (1 mg total) by mouth daily.    . insulin detemir (LEVEMIR) 100 UNIT/ML injection Inject 18 units into the skin daily.    . metoprolol succinate (TOPROL-XL) 100 MG 24 hr tablet Take 1 tablet (100 mg total) by mouth 2 (two) times daily. Take with or immediately following a meal.    . Multiple Vitamin (MULTIVITAMIN WITH MINERALS) TABS tablet Place 1 tablet into feeding tube daily.    Marland Kitchen. thiamine 100 MG tablet Take 1 tablet (100 mg total) by mouth daily.    Marland Kitchen. torsemide (DEMADEX) 20 MG tablet Take 1 tablet (20 mg total) by mouth daily.      Review of systems;  Gen; no complaints of fever or chills.  Skin does not complain of rashes or itching.  Head ears eyes nose mouth and throat does not complain of visual changes or sore throat Respiratory; no shortness of breath, no cough, Cardiac; no exertional chest pain, no palpitations GI; no abnormal pain, no change in bowel habits GU; no dysuria, no voiding difficulties Musculoskeletal; no joint pain Neurologic does not complain of dizziness or headache  Physical examination; Temperature is 97.9 pulse 68 respirations 18 blood pressure 128/72 this appears to be relatively baseline weight is 261.3 In general this is a pleasant elderly male in no distress Skin is warm and dry.    Marland Kitchen. HEENT; oropharynx is clear mucous membranes moist Respiratory; shallow but otherwise clear entry Cardiac; heart sounds sound regular without murmur-he does not appear to have significant lower extremity edema Abdomen; soft nontender with positive bowel sounds Extremities I do not note any deformities he is able to walk with a walker Mental status; he could answer most of my questions although I found him to be slow and vague--this is similar to what Dr. Leanord Hawkingobson has previously seen  Labs.  05/14/2014.  Sodium 137 potassium 4 BUN 29 creatinine 1.21.  05/07/2014.  WBC 6.6 hemoglobin 9.4  platelets 161.   Impression/plan 1; severe biventricular dysfunction.  This appears to be stable although he continues to be a fragile individual-edema appears quite minimal he is on low-dose torsemide as well as potassium supplementation --will have home health updated metabolic panel on Monday, April 25 notify primary care provider of results #2 atrial flutter; currently his pulse rate is is controlled  pulse is ranging from the high 50s to 90 per chart review-- and. #3 DVT and a possible pulmonary embolism on Eliquis. Peers to be stable. #4 type 2 diabetes now on insulin. I suspect he probably has diabetic neuropathy and diabetic PAD--blood sugars as stated above mildly elevated  At times but will not change medication right.   #5 listed as having cirrhosis of the liver presumably alcohol induced and presumably on imaging of the abdomen studies. Apparently his hepatitis studies were negative for. #6extensive alcohol abuse   He did have DTs  and the alcohol may have something to do with his cardiomyopathy, cognitive impairment and undoubtedly his cirrhosis--per nursing staff no evidence of DTs during his stay here he is pleasant and cooperative during exam although again somewhat slow to respond  #7-some element of chronic renal insufficiency this appears to be stable again this will need to be rechecked by home health upon discharge.  #8 weight loss-again this would warrant follow up for primary care provider clinically he appears to be stable  Also will update a CBC.  Patient would benefit from continued PT and OT and home health-with his history of weakness as well as atrial flutter CHF has significant medical issues.  Patient will be going home with his wife.  [id number] note greater than 30 minutes spent on this discharge summary l.  T

## 2014-05-21 DIAGNOSIS — Z5181 Encounter for therapeutic drug level monitoring: Secondary | ICD-10-CM | POA: Diagnosis not present

## 2014-05-21 DIAGNOSIS — E119 Type 2 diabetes mellitus without complications: Secondary | ICD-10-CM | POA: Diagnosis not present

## 2014-05-22 ENCOUNTER — Encounter: Payer: Self-pay | Admitting: Adult Health

## 2014-05-22 ENCOUNTER — Ambulatory Visit (INDEPENDENT_AMBULATORY_CARE_PROVIDER_SITE_OTHER): Payer: Medicare Other | Admitting: Adult Health

## 2014-05-22 VITALS — BP 106/70 | HR 53 | Ht 73.0 in | Wt 249.0 lb

## 2014-05-22 DIAGNOSIS — I483 Typical atrial flutter: Secondary | ICD-10-CM | POA: Diagnosis not present

## 2014-05-22 DIAGNOSIS — I426 Alcoholic cardiomyopathy: Secondary | ICD-10-CM | POA: Diagnosis not present

## 2014-05-22 DIAGNOSIS — Z136 Encounter for screening for cardiovascular disorders: Secondary | ICD-10-CM | POA: Diagnosis not present

## 2014-05-22 DIAGNOSIS — I10 Essential (primary) hypertension: Secondary | ICD-10-CM | POA: Diagnosis not present

## 2014-05-22 MED ORDER — CARVEDILOL 12.5 MG PO TABS: 12.5000 mg | ORAL_TABLET | Freq: Two times a day (BID) | ORAL | Status: DC

## 2014-05-22 NOTE — Progress Notes (Signed)
Cardiology Office Note   Date:  05/22/2014   ID:  Gabriel Reynolds, DOB 1955/08/15, MRN [id number]  PCP:  Pcp Not In System  Cardiologist:   [name], NP   Chief Complaint  Patient presents with  . Atrial Flutter  . Congestive Heart Failure    Mixed      History of Present Illness: Gabriel Reynolds is a 59 y.o. male who presents for ongoing assessment and management of atrial flutter, mixed, CHF, with history of hypertension, and diabetes.  The patient was seen during consultation during a lengthy hospitalization dating 04/13/2014 to 04/30/2014.  The patient was admitted with angioedema related to ACE, he was treated for sepsis, there was a period of time when he was intubated, do to severe angioedema and CHF.  Echocardiogram completed revealed severe systolic and diastolic dysfunction, as well as right ventricular dysfunction.  He was also found to have a right lower extremity DVT and pulmonary embolism.  He was started on apixaban 5 mg twice a day after loading dose.  The patient also developed renal failure and required dobutamine infusion.  The patient has a history of ETOH  abuse with suspected DTs during hospitalization.  He is being seen for close hospital followup due to multiple medical issues.   He comes today doing well, but is depressed because he cannot drive yet. His wife is concerned about the cost of his medications.  They have applied for Medicaid which has not been put into effect yet.  They have some medicine leftover that was given to them during his skilled nursing facility/physical therapy.  Stay post-hospitalization.he has maintained his weight and his wife is very strict on his salt intake.  He is no longer drinking alcohol.  Physical therapy is to take him to his home sometime this week.    Past Medical History  Diagnosis Date  . Hypertension   . Diabetes mellitus   . Acute combined systolic and diastolic CHF, NYHA class 4 04/13/2014  . Angioedema 04/28/2014  .  Alcohol abuse 04/28/2014  . Atrial flutter 04/28/2014  . Bacteremia due to Streptococcus 04/28/2014  . DVT (deep venous thrombosis)   . Hepatic cirrhosis 04/28/2014  . Streptococcus viridans infection 04/28/2014  . Foreign body (FB) in soft tissue 04/30/2014    Right upper arm soft tissue, failed PICC line placement, retained guide-wire fragment.     Past Surgical History  Procedure Laterality Date  . Hand surgery       No current outpatient prescriptions on file.   No current facility-administered medications for this visit.    Allergies:   Ace inhibitors and Other    Social History:  The patient  reports that he has never smoked. He has never used smokeless tobacco. He reports that he drinks about 4.2 oz of alcohol per week. He reports that he does not use illicit drugs.   Family History:  The patient's family history includes Diabetes in his mother.    ROS: .   All other systems are reviewed and negative.Unless otherwise mentioned in H&P above.   PHYSICAL EXAM: VS:  There were no vitals taken for this visit. , BMI There is no weight on file to calculate BMI. GEN: Well nourished, well developed, in no acute distress HEENT: normal Neck: no JVD, carotid bruits, or masses Cardiac: IRRR; no murmurs, rubs, or gallops,no edema  Respiratory:  Clear to auscultation bilaterally, normal work of breathing GI: soft, nontender, nondistended, + BS MS: no deformity or atrophy  Skin: warm and dry, no rash Neuro:  Strength and sensation are intact Psych: euthymic mood, full affect   EKG:  The ekg ordered today demonstrates Atrial flutter rate of 87 bpm.    Recent Labs: 04/13/2014: B Natriuretic Peptide 617.0*; TSH 1.213 04/22/2014: Magnesium 2.0 04/23/2014: ALT 37 05/07/2014: Hemoglobin 9.4*; Platelets 161 05/14/2014: BUN 29*; Creatinine 1.21; Potassium 4.0; Sodium 137    Lipid Panel    Component Value Date/Time   CHOL 145 04/14/2014 0427   TRIG 60 04/14/2014 0427   HDL 43 04/14/2014  0427   CHOLHDL 3.4 04/14/2014 0427   VLDL 12 04/14/2014 0427   LDLCALC 90 04/14/2014 0427      Wt Readings from Last 3 Encounters:  04/30/14 254 lb 8 oz (115.44 kg)  12/02/10 280 lb (127.007 kg)      Other studies Reviewed: Additional studies/ records that were reviewed today include: hospitalization records, echocardiogram, Review of the above records demonstrates: significant systolic dysfunction.   ASSESSMENT AND PLAN:  1. NICM EF of 25%. The patient remains on metoprolol succinate 100 mg twice a day, he is not on an ACE inhibitor due to severe allergic reaction to include angioedema leading to respiratory arrest, he will continue on torsemide, low-sodium diet and daily weights.  I have spent 30  minutes, with the patient and his wife explaining his medications, his current cardiomyopathy, and heart function, status, the need to restrict his sodium and fluids and the need to be medically compliant.we will be changing his metoprolol to coreg 12.5 mg BID as he can get [facility] at [facility], as this is less expensive for him. Blood pressure is low normal for him.  No other changes in his medications   2. Atrial fibrillation: Heart rate is well-controlled currently.  He is given samples of Eliquis 5 mg BID, one-month supply, he is also given a 30 day free coupon.  He denies any bleeding moccasins or melena.  We will continue him on his current dose of metoprolol.I explained to him in detail the reasons for to him on the anticoagulation, he verbalizes understanding.  I have advised him that if he runs out of this medication before his Medicaid began to, he is to call us to see if we can provide some samples.  3. ACE Induced Anasarca with Respiratory Distress: he will and not be placed on ACE inhibitors.  This is been added to his allergy list.  He is breathing well without evidence of any dyspnea.  He continues deconditioned.  4. Diabetes; this will be managed by his primary care physician.  5.  Severe deconditioning: physical therapy will be coming to his home, to assist him with strengthening.  6. Situational Depression: I have offered a short-term course of antidepressants.  He does not wish to use them now.  I discussed with him that he may have some temporary depression associated with his current health status and no ability to go back to his usual energy level.  I have advised him that if this is something he may need to place.  Let us know as this is expected , and will be for short term only.  He verbalizes understanding  7. ETOH abuse:his wife assures me that he is no longer drinking alcohol.  It is not in the house and has not been bothered for him.  I advised him that this is not a lifestyle that he needs return to with his current health status and medication regimen.  He verbalizes understanding  Current medicines are reviewed at length with the patient today.    Labs/ tests ordered today include: BMET.  No orders of the defined types were placed in this encounter.   I have spent 30 minutes with this patient and his wife.  Disposition:   FU with one month Signed, Joni ReiningKathryn Omelia Marquart, NP  05/22/2014 7:05 AM    [facility] [address], [place], [place] [address] Phone: [phone]; Fax: [phone] and

## 2014-05-22 NOTE — Progress Notes (Deleted)
Name: Gabriel Reynolds    DOB: Jan 11, 1956  Age: 59 y.o.  MR#: [id number]       PCP:  Pcp Not In System      Insurance: Payor: MEDICARE / Plan: MEDICARE PART A AND B / Product Type: *No Product type* /   CC:    Chief Complaint  Patient presents with  . Atrial Flutter  . Congestive Heart Failure    Mixed    VS Filed Vitals:   05/22/14 1316  BP: 106/70  Pulse: 53  Height:  (1.854 m)  Weight: 249 lb (112.946 kg)  SpO2: 95%    Weights Current Weight  05/22/14 249 lb (112.946 kg)  04/30/14 254 lb 8 oz (115.44 kg)  12/02/10 280 lb (127.007 kg)    Blood Pressure  BP Readings from Last 3 Encounters:  05/22/14 106/70  04/30/14 104/59  01/11/11 181/83     Admit date:  (Not on file) Last encounter with RMR:  Visit date not found   Allergy Ace inhibitors and Other  No current outpatient prescriptions on file.   No current facility-administered medications for this visit.    Discontinued Meds:    Medications Discontinued During This Encounter  Medication Reason  . Amino Acids-Protein Hydrolys (FEEDING SUPPLEMENT, PRO-STAT SUGAR FREE 64,) LIQD Error  . cefTRIAXone 2 g in dextrose 5 % 50 mL Error  . apixaban (ELIQUIS) 5 MG TABS tablet Error    Patient Active Problem List   Diagnosis Date Noted  . Foreign body (FB) in soft tissue 04/30/2014  . Atrial flutter 04/28/2014  . AKI (acute kidney injury) 04/28/2014  . Streptococcus viridans infection 04/28/2014  . Bacteremia due to Streptococcus 04/28/2014  . Hepatic cirrhosis 04/28/2014  . Alcohol abuse 04/28/2014  . Angioedema 04/28/2014  . DVT (deep venous thrombosis)   . Acute combined systolic and diastolic CHF, NYHA class 4 04/13/2014    LABS    Component Value Date/Time   NA 137 05/14/2014 0800   NA 134* 05/10/2014 1431   NA 137 05/10/2014 0700   K 4.0 05/14/2014 0800   K 4.0 05/10/2014 1431   K 4.0 05/10/2014 0700   CL 102 05/14/2014 0800   CL 99 05/10/2014 1431   CL 103 05/10/2014 0700   CO2 28  05/14/2014 0800   CO2 28 05/10/2014 1431   CO2 29 05/10/2014 0700   GLUCOSE 181* 05/14/2014 0800   GLUCOSE 192* 05/10/2014 1431   GLUCOSE 166* 05/10/2014 0700   BUN 29* 05/14/2014 0800   BUN 27* 05/10/2014 1431   BUN 27* 05/10/2014 0700   CREATININE 1.21 05/14/2014 0800   CREATININE 1.35 05/10/2014 1431   CREATININE 1.22 05/10/2014 0700   CALCIUM 9.1 05/14/2014 0800   CALCIUM 8.9 05/10/2014 1431   CALCIUM 8.8 05/10/2014 0700   GFRNONAA 63* 05/14/2014 0800   GFRNONAA 55* 05/10/2014 1431   GFRNONAA 62* 05/10/2014 0700   GFRAA 73* 05/14/2014 0800   GFRAA 64* 05/10/2014 1431   GFRAA 72* 05/10/2014 0700   CMP     Component Value Date/Time   NA 137 05/14/2014 0800   K 4.0 05/14/2014 0800   CL 102 05/14/2014 0800   CO2 28 05/14/2014 0800   GLUCOSE 181* 05/14/2014 0800   BUN 29* 05/14/2014 0800   CREATININE 1.21 05/14/2014 0800   CALCIUM 9.1 05/14/2014 0800   PROT 6.4 04/23/2014 0600   ALBUMIN 3.1* 04/23/2014 0600   AST 32 04/23/2014 0600   ALT 37 04/23/2014 0600   ALKPHOS  66 04/23/2014 0600   BILITOT 1.8* 04/23/2014 0600   GFRNONAA 63* 05/14/2014 0800   GFRAA 73* 05/14/2014 0800       Component Value Date/Time   WBC 6.6 05/07/2014 0630   WBC 14.7* 04/27/2014 0704   WBC 18.0* 04/26/2014 1055   HGB 9.4* 05/07/2014 0630   HGB 8.2* 04/27/2014 0704   HGB 8.7* 04/26/2014 1055   HCT 28.9* 05/07/2014 0630   HCT 24.7* 04/27/2014 0704   HCT 25.8* 04/26/2014 1055   MCV 97.3 05/07/2014 0630   MCV 96.5 04/27/2014 0704   MCV 96.6 04/26/2014 1055    Lipid Panel     Component Value Date/Time   CHOL 145 04/14/2014 0427   TRIG 60 04/14/2014 0427   HDL 43 04/14/2014 0427   CHOLHDL 3.4 04/14/2014 0427   VLDL 12 04/14/2014 0427   LDLCALC 90 04/14/2014 0427    ABG    Component Value Date/Time   PHART 7.461* 04/22/2014 0502   PCO2ART 41.5 04/22/2014 0502   PO2ART 100.0 04/22/2014 0502   HCO3 29.2* 04/22/2014 0502   TCO2 26.3 04/22/2014 0502   ACIDBASEDEF 1.1 04/18/2014  0346   O2SAT 97.6 04/22/2014 0502     Lab Results  Component Value Date   TSH 1.213 04/13/2014   BNP (last 3 results)  Recent Labs  04/13/14 1110  BNP 617.0*    ProBNP (last 3 results) No results for input(s): PROBNP in the last 8760 hours.  Cardiac Panel (last 3 results) No results for input(s): CKTOTAL, CKMB, TROPONINI, RELINDX in the last 72 hours.  Iron/TIBC/Ferritin/ %Sat No results found for: IRON, TIBC, FERRITIN, IRONPCTSAT   EKG Orders placed or performed during the hospital encounter of 04/13/14  . ED EKG  . ED EKG  . EKG 12-Lead  . EKG 12-Lead  . EKG 12-Lead  . EKG 12-Lead  . EKG 12-Lead  . EKG 12-Lead  . EKG  . EKG 12-Lead  . EKG 12-Lead     Prior Assessment and Plan Problem List as of 05/22/2014      Cardiovascular and Mediastinum   Acute combined systolic and diastolic CHF, NYHA class 4   DVT (deep venous thrombosis)   Atrial flutter     Digestive   Hepatic cirrhosis     Genitourinary   AKI (acute kidney injury)     Other   Streptococcus viridans infection   Bacteremia due to Streptococcus   Alcohol abuse   Angioedema   Foreign body (FB) in soft tissue       Imaging: Dg Chest Port 1 View  04/26/2014   CLINICAL DATA:  Shortness of breath and edema  EXAM: PORTABLE CHEST - 1 VIEW  COMPARISON:  04/22/2014  FINDINGS: Cardiac shadow is mildly enlarged but stable. A left PICC line is again seen in the mid superior vena cava. No focal infiltrate or sizable effusion is seen. No bony abnormality is noted.  IMPRESSION: No acute abnormality seen.   Electronically Signed   By: Alcide Clever M.D.   On: 04/26/2014 09:47   Dg Humerus Right  04/30/2014   CLINICAL DATA:  Right arm bruising and pain. Recent attempted PICC line placement with broken guidewire and possible foreign body.  EXAM: RIGHT HUMERUS - 2+ VIEW  COMPARISON:  None.  FINDINGS: There is no evidence of fracture or other focal bone lesions.  A small linear radiopaque foreign body measuring  approximately 4 mm in length is seen in the anterior and medial soft tissues along the mid  to distal humeral diaphysis.  IMPRESSION: 4 mm linear radiopaque foreign body in anterior and medial soft tissues at the level of the mid to distal humeral diaphysis.  No osseous abnormality.   Electronically Signed   By: Myles RosenthalJohn  Stahl M.D.   On: 04/30/2014 12:19

## 2014-05-22 NOTE — Addendum Note (Signed)
Addended by: Kerney ElbePINNIX, Braeden Dolinski G on: 05/22/2014 02:20 PM   Modules accepted: Orders

## 2014-05-22 NOTE — Patient Instructions (Addendum)
Your physician recommends that you schedule a follow-up appointment in: 1 month with Dr. Koneswaran  YoPurvis Sheffieldur physician recommends that you return for lab work in: [place] [employer]  Start Coreg 12.5 Two times daily  Thank you for choosing [facility]!

## 2014-05-23 ENCOUNTER — Encounter: Payer: Self-pay | Admitting: Internal Medicine

## 2014-05-23 ENCOUNTER — Telehealth: Payer: Self-pay | Admitting: Adult Health

## 2014-05-23 NOTE — Telephone Encounter (Signed)
Please call patient's wife regarding medication concerns / tg

## 2014-05-23 NOTE — Telephone Encounter (Signed)
Spoke with wife. She voiced understanding.

## 2014-05-23 NOTE — Telephone Encounter (Signed)
Patient's wife called again. I asked for more details. She said she cannot get the metoprolol filled due to no insurance coverage and they haven't gotten the prescribed vitamins either.

## 2014-05-25 DIAGNOSIS — I10 Essential (primary) hypertension: Secondary | ICD-10-CM | POA: Diagnosis not present

## 2014-05-25 LAB — BASIC METABOLIC PANEL
BUN: 19 mg/dL (ref 6–23)
CO2: 26 meq/L (ref 19–32)
CREATININE: 1.14 mg/dL (ref 0.50–1.35)
Calcium: 8.5 mg/dL (ref 8.4–10.5)
Chloride: 102 mEq/L (ref 96–112)
Glucose, Bld: 148 mg/dL — ABNORMAL HIGH (ref 70–99)
Potassium: 3.8 mEq/L (ref 3.5–5.3)
Sodium: 137 mEq/L (ref 135–145)

## 2014-06-13 DIAGNOSIS — E1165 Type 2 diabetes mellitus with hyperglycemia: Secondary | ICD-10-CM | POA: Diagnosis not present

## 2014-06-13 DIAGNOSIS — I10 Essential (primary) hypertension: Secondary | ICD-10-CM | POA: Diagnosis not present

## 2014-06-13 DIAGNOSIS — E780 Pure hypercholesterolemia: Secondary | ICD-10-CM | POA: Diagnosis not present

## 2014-06-13 DIAGNOSIS — I5022 Chronic systolic (congestive) heart failure: Secondary | ICD-10-CM | POA: Diagnosis not present

## 2014-06-13 DIAGNOSIS — K7030 Alcoholic cirrhosis of liver without ascites: Secondary | ICD-10-CM | POA: Diagnosis not present

## 2014-06-29 ENCOUNTER — Encounter: Payer: Self-pay | Admitting: Adult Health

## 2014-06-29 ENCOUNTER — Ambulatory Visit (INDEPENDENT_AMBULATORY_CARE_PROVIDER_SITE_OTHER): Payer: Medicare Other | Admitting: Adult Health

## 2014-06-29 VITALS — BP 112/78 | HR 73 | Wt 267.0 lb

## 2014-06-29 DIAGNOSIS — I10 Essential (primary) hypertension: Secondary | ICD-10-CM

## 2014-06-29 DIAGNOSIS — I481 Persistent atrial fibrillation: Secondary | ICD-10-CM | POA: Diagnosis not present

## 2014-06-29 DIAGNOSIS — I426 Alcoholic cardiomyopathy: Secondary | ICD-10-CM | POA: Diagnosis not present

## 2014-06-29 DIAGNOSIS — I4819 Other persistent atrial fibrillation: Secondary | ICD-10-CM

## 2014-06-29 MED ORDER — TORSEMIDE 20 MG PO TABS: 20.0000 mg | ORAL_TABLET | Freq: Every day | ORAL | Status: DC

## 2014-06-29 NOTE — Progress Notes (Deleted)
Name: Gabriel Reynolds    DOB: 1955/06/25  Age: 59 y.o.  MR#: [id number]       PCP:  Avon Gully, MD      Insurance: Payor: MEDICARE / Plan: MEDICARE PART A AND B / Product Type: *No Product type* /   CC:    Chief Complaint  Patient presents with  . Atrial Fibrillation  . Congestive Heart Failure  . Hypertension    VS Filed Vitals:   06/29/14 1321  BP: 112/78  Pulse: 73  Weight: 267 lb (121.11 kg)  SpO2: 96%    Weights Current Weight  06/29/14 267 lb (121.11 kg)  05/22/14 249 lb (112.946 kg)  04/30/14 254 lb 8 oz (115.44 kg)    Blood Pressure  BP Readings from Last 3 Encounters:  06/29/14 112/78  05/22/14 106/70  05/23/14 128/72     Admit date:  (Not on file) Last encounter with RMR:  05/23/2014   Allergy Ace inhibitors and Other  Current Outpatient Prescriptions  Medication Sig Dispense Refill  . apixaban (ELIQUIS) 5 MG TABS tablet Take 1 tablet (5 mg total) by mouth 2 (two) times daily. Start April 5th in the morning.    Marland Kitchen b complex vitamins tablet Take 1 tablet by mouth daily.    . carvedilol (COREG) 12.5 MG tablet Take 1 tablet (12.5 mg total) by mouth 2 (two) times daily with a meal. 30 tablet 1  . folic acid (FOLVITE) 1 MG tablet Take 1 tablet (1 mg total) by mouth daily.    . furosemide (LASIX) 20 MG tablet   3  . insulin detemir (LEVEMIR) 100 UNIT/ML injection Inject 0.05 mLs (5 Units total) into the skin daily.    . potassium chloride (KLOR-CON) 20 MEQ packet Take 20 mEq by mouth daily.     No current facility-administered medications for this visit.    Discontinued Meds:    Medications Discontinued During This Encounter  Medication Reason  . torsemide (DEMADEX) 20 MG tablet Error  . feeding supplement, GLUCERNA SHAKE, (GLUCERNA SHAKE) LIQD Error  . Multiple Vitamin (MULTIVITAMIN WITH MINERALS) TABS tablet Error  . thiamine 100 MG tablet Error    Patient Active Problem List   Diagnosis Date Noted  . Foreign body (FB) in soft tissue 04/30/2014  .  Atrial flutter 04/28/2014  . AKI (acute kidney injury) 04/28/2014  . Streptococcus viridans infection 04/28/2014  . Bacteremia due to Streptococcus 04/28/2014  . Hepatic cirrhosis 04/28/2014  . Alcohol abuse 04/28/2014  . Angioedema 04/28/2014  . DVT (deep venous thrombosis)   . Acute combined systolic and diastolic CHF, NYHA class 4 04/13/2014    LABS    Component Value Date/Time   NA 137 05/25/2014 1022   NA 137 05/14/2014 0800   NA 134* 05/10/2014 1431   K 3.8 05/25/2014 1022   K 4.0 05/14/2014 0800   K 4.0 05/10/2014 1431   CL 102 05/25/2014 1022   CL 102 05/14/2014 0800   CL 99 05/10/2014 1431   CO2 26 05/25/2014 1022   CO2 28 05/14/2014 0800   CO2 28 05/10/2014 1431   GLUCOSE 148* 05/25/2014 1022   GLUCOSE 181* 05/14/2014 0800   GLUCOSE 192* 05/10/2014 1431   BUN 19 05/25/2014 1022   BUN 29* 05/14/2014 0800   BUN 27* 05/10/2014 1431   CREATININE 1.14 05/25/2014 1022   CREATININE 1.21 05/14/2014 0800   CREATININE 1.35 05/10/2014 1431   CREATININE 1.22 05/10/2014 0700   CALCIUM 8.5 05/25/2014 1022   CALCIUM 9.1  05/14/2014 0800   CALCIUM 8.9 05/10/2014 1431   GFRNONAA 63* 05/14/2014 0800   GFRNONAA 55* 05/10/2014 1431   GFRNONAA 62* 05/10/2014 0700   GFRAA 73* 05/14/2014 0800   GFRAA 64* 05/10/2014 1431   GFRAA 72* 05/10/2014 0700   CMP     Component Value Date/Time   NA 137 05/25/2014 1022   K 3.8 05/25/2014 1022   CL 102 05/25/2014 1022   CO2 26 05/25/2014 1022   GLUCOSE 148* 05/25/2014 1022   BUN 19 05/25/2014 1022   CREATININE 1.14 05/25/2014 1022   CREATININE 1.21 05/14/2014 0800   CALCIUM 8.5 05/25/2014 1022   PROT 6.4 04/23/2014 0600   ALBUMIN 3.1* 04/23/2014 0600   AST 32 04/23/2014 0600   ALT 37 04/23/2014 0600   ALKPHOS 66 04/23/2014 0600   BILITOT 1.8* 04/23/2014 0600   GFRNONAA 63* 05/14/2014 0800   GFRAA 73* 05/14/2014 0800       Component Value Date/Time   WBC 6.6 05/07/2014 0630   WBC 14.7* 04/27/2014 0704   WBC 18.0*  04/26/2014 1055   HGB 9.4* 05/07/2014 0630   HGB 8.2* 04/27/2014 0704   HGB 8.7* 04/26/2014 1055   HCT 28.9* 05/07/2014 0630   HCT 24.7* 04/27/2014 0704   HCT 25.8* 04/26/2014 1055   MCV 97.3 05/07/2014 0630   MCV 96.5 04/27/2014 0704   MCV 96.6 04/26/2014 1055    Lipid Panel     Component Value Date/Time   CHOL 145 04/14/2014 0427   TRIG 60 04/14/2014 0427   HDL 43 04/14/2014 0427   CHOLHDL 3.4 04/14/2014 0427   VLDL 12 04/14/2014 0427   LDLCALC 90 04/14/2014 0427    ABG    Component Value Date/Time   PHART 7.461* 04/22/2014 0502   PCO2ART 41.5 04/22/2014 0502   PO2ART 100.0 04/22/2014 0502   HCO3 29.2* 04/22/2014 0502   TCO2 26.3 04/22/2014 0502   ACIDBASEDEF 1.1 04/18/2014 0346   O2SAT 97.6 04/22/2014 0502     Lab Results  Component Value Date   TSH 1.213 04/13/2014   BNP (last 3 results)  Recent Labs  04/13/14 1110  BNP 617.0*    ProBNP (last 3 results) No results for input(s): PROBNP in the last 8760 hours.  Cardiac Panel (last 3 results) No results for input(s): CKTOTAL, CKMB, TROPONINI, RELINDX in the last 72 hours.  Iron/TIBC/Ferritin/ %Sat No results found for: IRON, TIBC, FERRITIN, IRONPCTSAT   EKG Orders placed or performed in visit on 05/22/14  . EKG 12-Lead     Prior Assessment and Plan Problem List as of 06/29/2014      Cardiovascular and Mediastinum   Acute combined systolic and diastolic CHF, NYHA class 4   DVT (deep venous thrombosis)   Atrial flutter     Digestive   Hepatic cirrhosis     Genitourinary   AKI (acute kidney injury)     Other   Streptococcus viridans infection   Bacteremia due to Streptococcus   Alcohol abuse   Angioedema   Foreign body (FB) in soft tissue       Imaging: No results found.

## 2014-06-29 NOTE — Patient Instructions (Addendum)
Your physician recommends that you schedule a follow-up appointment in: 1 month with Joni ReiningKathryn Lawrence, NP.   Your physician has recommended you make the following change in your medication:   STOP Taking Lasix  Start Taking Torsemide 20 mg Daily   Your physician recommends that you return for lab work on Tue. ( BMET)   Thank you for choosing [facility]!

## 2014-06-29 NOTE — Progress Notes (Signed)
Cardiology Office Note   Date:  06/29/2014   ID:  Gabriel Reynolds, DOB Jul 26, 1955, MRN [id number]  PCP:  Avon Gully, MD  Cardiologist:  Arlington Calix, NP   Chief Complaint  Patient presents with  . Atrial Fibrillation  . Congestive Heart Failure  . Hypertension      History of Present Illness: Gabriel Reynolds is a 58 y.o. male who presents for ongoing assessment and management of atrial flutter, mixed CHF, NICM EF of 25%, likely ETOH related,  hypertension, with hx of diabetes. He had recent hospitalization for angioedema caused by ACE inhibitor.He was last seen on 05/22/2014 and was stable.Follow up BMET was completed.  Na: 137; Potassium 3.8; Creatinine 1.14. Glucose 148.    He comes today having gained 11 lbs. He is drinking beer again," but not as much." He is eating peanut butter crackers. He says that he eats most at home but will eat out sometimes. His wife comes with him with his wt chart. His PCP, Dr. Felecia Shelling, has changed his torsemide to lasix, thinking he was taking both medications. He denies worseinng dyspnea, or chest pain. No bleeding with use of apixaban..   Past Medical History  Diagnosis Date  . Hypertension   . Diabetes mellitus   . Acute combined systolic and diastolic CHF, NYHA class 4 04/13/2014  . Angioedema 04/28/2014  . Alcohol abuse 04/28/2014  . Atrial flutter 04/28/2014  . Bacteremia due to Streptococcus 04/28/2014  . DVT (deep venous thrombosis)   . Hepatic cirrhosis 04/28/2014  . Streptococcus viridans infection 04/28/2014  . Foreign body (FB) in soft tissue 04/30/2014    Right upper arm soft tissue, failed PICC line placement, retained guide-wire fragment.     Past Surgical History  Procedure Laterality Date  . Hand surgery       Current Outpatient Prescriptions  Medication Sig Dispense Refill  . apixaban (ELIQUIS) 5 MG TABS tablet Take 1 tablet (5 mg total) by mouth 2 (two) times daily. Start April 5th in the morning.    Marland Kitchen b complex vitamins tablet  Take 1 tablet by mouth daily.    . carvedilol (COREG) 12.5 MG tablet Take 1 tablet (12.5 mg total) by mouth 2 (two) times daily with a meal. 30 tablet 1  . folic acid (FOLVITE) 1 MG tablet Take 1 tablet (1 mg total) by mouth daily.    . furosemide (LASIX) 20 MG tablet   3  . insulin detemir (LEVEMIR) 100 UNIT/ML injection Inject 0.05 mLs (5 Units total) into the skin daily.    . potassium chloride (KLOR-CON) 20 MEQ packet Take 20 mEq by mouth daily.    Marland Kitchen torsemide (DEMADEX) 20 MG tablet Take 1 tablet (20 mg total) by mouth daily. 30 tablet 6   No current facility-administered medications for this visit.    Allergies:   Ace inhibitors and Other    Social History:  The patient  reports that he has never smoked. He has never used smokeless tobacco. He reports that he drinks about 4.2 oz of alcohol per week. He reports that he does not use illicit drugs.   Family History:  The patient's family history includes Diabetes in his mother.    ROS: .   All other systems are reviewed and negative.Unless otherwise mentioned in H&P above.   PHYSICAL EXAM: VS:  BP 112/78 mmHg  Pulse 73  Wt 267 lb (121.11 kg)  SpO2 96% , BMI Body mass index is 35.23 kg/(m^2). GEN: Well nourished, well  developed, in no acute distress HEENT: normal Neck: no JVD, carotid bruits, or masses Cardiac: IRRR 1/6 systolic murmur; no murmurs, rubs, or gallops,no edema  Respiratory:  clear to auscultation bilaterally, normal work of breathing GI: soft, nontender, nondistended, + BS MS: no deformity or atrophy Skin: warm and dry, no rash Neuro:  Strength and sensation are intact Psych: euthymic mood, full affect   Recent Labs: 04/13/2014: B Natriuretic Peptide 617.0*; TSH 1.213 04/22/2014: Magnesium 2.0 04/23/2014: ALT 37 05/07/2014: Hemoglobin 9.4*; Platelets 161 05/25/2014: BUN 19; Creatinine 1.14; Potassium 3.8; Sodium 137    Lipid Panel    Component Value Date/Time   CHOL 145 04/14/2014 0427   TRIG 60 04/14/2014  0427   HDL 43 04/14/2014 0427   CHOLHDL 3.4 04/14/2014 0427   VLDL 12 04/14/2014 0427   LDLCALC 90 04/14/2014 0427      Wt Readings from Last 3 Encounters:  06/29/14 267 lb (121.11 kg)  05/22/14 249 lb (112.946 kg)  04/30/14 254 lb 8 oz (115.44 kg)      Other studies Reviewed: Additional studies/ records that were reviewed today include: None Review of the above records demonstrates: N/A   ASSESSMENT AND PLAN:  1.  NICM: EF of 25%. He has gained 11 lbs since being seen last. He was changed to lasix from torsemide by PCP. Will stop lasix and start back torsemide 20 mg daily. I will repeat a BMET in one week. I have counseled him on low sodium diet, and ETOH. He verbalizes understanding. I am concerned that he will be decompensated soon if he continues to be non-adherent with medication regimen. Will see him again in one month. Will need to adjust medications even further on next visit. Perhaps increase BB.   2. Hypertension: Currently well controlled. No ACE inhibitors due to angioedema reaction.  3. Atrial flutter: Heart rate is well controlled. Continue apixaban.    Current medicines are reviewed at length with the patient today.    Labs/ tests ordered today include: BMET  Orders Placed This Encounter  Procedures  . Basic Metabolic Panel (BMET)     Disposition:   FU with  One month  Signed, Joni ReiningKathryn Jeannie Mallinger, NP  06/29/2014 2:36 PM    [facility] [address], [place], [place] [address] Phone: [phone]; Fax: [phone]) [phone] As she is.

## 2014-07-03 DIAGNOSIS — I10 Essential (primary) hypertension: Secondary | ICD-10-CM | POA: Diagnosis not present

## 2014-07-04 LAB — BASIC METABOLIC PANEL
BUN: 18 mg/dL (ref 6–23)
CO2: 28 mEq/L (ref 19–32)
Calcium: 8.9 mg/dL (ref 8.4–10.5)
Chloride: 103 mEq/L (ref 96–112)
Creat: 1.31 mg/dL (ref 0.50–1.35)
Glucose, Bld: 141 mg/dL — ABNORMAL HIGH (ref 70–99)
Potassium: 3.7 mEq/L (ref 3.5–5.3)
SODIUM: 141 meq/L (ref 135–145)

## 2014-07-07 ENCOUNTER — Telehealth: Payer: Self-pay | Admitting: Cardiology

## 2014-07-07 ENCOUNTER — Other Ambulatory Visit: Payer: Self-pay | Admitting: Cardiology

## 2014-07-07 MED ORDER — CARVEDILOL 12.5 MG PO TABS: 12.5000 mg | ORAL_TABLET | Freq: Two times a day (BID) | ORAL | Status: DC

## 2014-07-07 NOTE — Telephone Encounter (Signed)
Refilled Coreg  Eivan Gallina PA-C 07/07/2014 9:09 AM

## 2014-07-20 ENCOUNTER — Other Ambulatory Visit: Payer: Self-pay | Admitting: Internal Medicine

## 2014-07-21 ENCOUNTER — Other Ambulatory Visit: Payer: Self-pay | Admitting: Internal Medicine

## 2014-07-24 ENCOUNTER — Other Ambulatory Visit (HOSPITAL_COMMUNITY): Payer: Self-pay | Admitting: Internal Medicine

## 2014-07-24 DIAGNOSIS — E1165 Type 2 diabetes mellitus with hyperglycemia: Secondary | ICD-10-CM | POA: Diagnosis not present

## 2014-07-24 DIAGNOSIS — I482 Chronic atrial fibrillation, unspecified: Secondary | ICD-10-CM

## 2014-07-24 DIAGNOSIS — I739 Peripheral vascular disease, unspecified: Secondary | ICD-10-CM | POA: Diagnosis not present

## 2014-07-24 DIAGNOSIS — I4891 Unspecified atrial fibrillation: Secondary | ICD-10-CM | POA: Diagnosis not present

## 2014-07-24 DIAGNOSIS — I10 Essential (primary) hypertension: Secondary | ICD-10-CM | POA: Diagnosis not present

## 2014-07-26 ENCOUNTER — Telehealth: Payer: Self-pay | Admitting: Adult Health

## 2014-07-26 ENCOUNTER — Ambulatory Visit (HOSPITAL_COMMUNITY)
Admission: RE | Admit: 2014-07-26 | Discharge: 2014-07-26 | Disposition: A | Discharge status: Home / Self Care | Payer: Medicare Other | Source: Ambulatory Visit | Attending: Internal Medicine | Admitting: Internal Medicine

## 2014-07-26 DIAGNOSIS — I482 Chronic atrial fibrillation, unspecified: Secondary | ICD-10-CM

## 2014-07-26 DIAGNOSIS — R29898 Other symptoms and signs involving the musculoskeletal system: Secondary | ICD-10-CM | POA: Diagnosis not present

## 2014-07-26 DIAGNOSIS — I4891 Unspecified atrial fibrillation: Secondary | ICD-10-CM | POA: Diagnosis not present

## 2014-07-26 DIAGNOSIS — R531 Weakness: Secondary | ICD-10-CM | POA: Diagnosis not present

## 2014-07-26 MED ORDER — APIXABAN 5 MG PO TABS: 5.0000 mg | ORAL_TABLET | Freq: Two times a day (BID) | ORAL | Status: DC

## 2014-07-26 NOTE — Telephone Encounter (Signed)
Pt needs a refill on his Eliquis --they have called the pharmacy said they can't give him refills

## 2014-07-26 NOTE — Telephone Encounter (Signed)
Refill completed.

## 2014-07-27 ENCOUNTER — Telehealth: Payer: Self-pay | Admitting: Adult Health

## 2014-07-31 ENCOUNTER — Other Ambulatory Visit (HOSPITAL_COMMUNITY)
Admission: RE | Admit: 2014-07-31 | Discharge: 2014-07-31 | Disposition: A | Discharge status: Home / Self Care | Payer: Medicare Other | Source: Ambulatory Visit | Attending: Adult Health | Admitting: Adult Health

## 2014-07-31 ENCOUNTER — Encounter: Payer: Self-pay | Admitting: Adult Health

## 2014-07-31 ENCOUNTER — Ambulatory Visit (INDEPENDENT_AMBULATORY_CARE_PROVIDER_SITE_OTHER): Payer: Medicare Other | Admitting: Adult Health

## 2014-07-31 VITALS — BP 120/80 | HR 76 | Ht 73.0 in | Wt 263.0 lb

## 2014-07-31 DIAGNOSIS — R200 Anesthesia of skin: Secondary | ICD-10-CM | POA: Diagnosis not present

## 2014-07-31 DIAGNOSIS — Z79899 Other long term (current) drug therapy: Secondary | ICD-10-CM

## 2014-07-31 DIAGNOSIS — I429 Cardiomyopathy, unspecified: Secondary | ICD-10-CM | POA: Diagnosis not present

## 2014-07-31 DIAGNOSIS — I426 Alcoholic cardiomyopathy: Secondary | ICD-10-CM

## 2014-07-31 DIAGNOSIS — R202 Paresthesia of skin: Secondary | ICD-10-CM

## 2014-07-31 DIAGNOSIS — I5022 Chronic systolic (congestive) heart failure: Secondary | ICD-10-CM | POA: Diagnosis not present

## 2014-07-31 LAB — CBC WITH DIFFERENTIAL/PLATELET
Basophils Absolute: 0 10*3/uL (ref 0.0–0.1)
Basophils Relative: 0 % (ref 0–1)
EOS ABS: 0.2 10*3/uL (ref 0.0–0.7)
EOS PCT: 3 % (ref 0–5)
HCT: 40.9 % (ref 39.0–52.0)
HEMOGLOBIN: 14 g/dL (ref 13.0–17.0)
Lymphocytes Relative: 39 % (ref 12–46)
Lymphs Abs: 2.6 10*3/uL (ref 0.7–4.0)
MCH: 29.2 pg (ref 26.0–34.0)
MCHC: 34.2 g/dL (ref 30.0–36.0)
MCV: 85.4 fL (ref 78.0–100.0)
MONOS PCT: 7 % (ref 3–12)
Monocytes Absolute: 0.4 10*3/uL (ref 0.1–1.0)
Neutro Abs: 3.5 10*3/uL (ref 1.7–7.7)
Neutrophils Relative %: 51 % (ref 43–77)
PLATELETS: 136 10*3/uL — AB (ref 150–400)
RBC: 4.79 MIL/uL (ref 4.22–5.81)
RDW: 14.5 % (ref 11.5–15.5)
WBC: 6.7 10*3/uL (ref 4.0–10.5)

## 2014-07-31 LAB — BASIC METABOLIC PANEL
ANION GAP: 9 (ref 5–15)
BUN: 26 mg/dL — ABNORMAL HIGH (ref 6–20)
CHLORIDE: 101 mmol/L (ref 101–111)
CO2: 28 mmol/L (ref 22–32)
Calcium: 8.9 mg/dL (ref 8.9–10.3)
Creatinine, Ser: 1.44 mg/dL — ABNORMAL HIGH (ref 0.61–1.24)
GFR, EST AFRICAN AMERICAN: 59 mL/min — AB (ref >60)
GFR, EST NON AFRICAN AMERICAN: 51 mL/min — AB (ref >60)
Glucose, Bld: 269 mg/dL — ABNORMAL HIGH (ref 65–99)
POTASSIUM: 3.8 mmol/L (ref 3.5–5.1)
SODIUM: 138 mmol/L (ref 135–145)

## 2014-07-31 NOTE — Patient Instructions (Signed)
Your physician recommends that you schedule a follow-up appointment in: 1 month with Gabriel PhoK. Lawrence, NP.  Your physician recommends that you continue on your current medications as directed. Please refer to the Current Medication list given to you today.  Your physician recommends that you return for lab work in: CBC, BMET  Please have CT of head done.   Thank you for choosing [facility]!

## 2014-07-31 NOTE — Progress Notes (Deleted)
Name: Gabriel Reynolds    DOB: [birth year]  Age: 59 y.o.  MR#: [id number]       PCP:  Avon Gully, MD      Insurance: Payor: MEDICARE / Plan: MEDICARE PART A AND B / Product Type: *No Product type* /   CC:    Chief Complaint  Patient presents with  . Atrial Fibrillation    VS Filed Vitals:   07/31/14 1350  BP: 120/80  Pulse: 76  Height: 6\' 1"  (1.854 m)  Weight: 263 lb (119.296 kg)  SpO2: 97%    Weights Current Weight  07/31/14 263 lb (119.296 kg)  06/29/14 267 lb (121.11 kg)  05/22/14 249 lb (112.946 kg)    Blood Pressure  BP Readings from Last 3 Encounters:  07/31/14 120/80  06/29/14 112/78  05/22/14 106/70     Admit date:  (Not on file) Last encounter with RMR:  07/27/2014   Allergy Ace inhibitors and Other  Current Outpatient Prescriptions  Medication Sig Dispense Refill  . apixaban (ELIQUIS) 5 MG TABS tablet Take 1 tablet (5 mg total) by mouth 2 (two) times daily. 60 tablet 6  . b complex vitamins tablet Take 1 tablet by mouth daily.    . carvedilol (COREG) 12.5 MG tablet Take 1 tablet (12.5 mg total) by mouth 2 (two) times daily with a meal. 60 tablet 10  . folic acid (FOLVITE) 1 MG tablet Take 1 tablet (1 mg total) by mouth daily.    . furosemide (LASIX) 20 MG tablet   3  . insulin detemir (LEVEMIR) 100 UNIT/ML injection Inject 0.05 mLs (5 Units total) into the skin daily.    . potassium chloride (KLOR-CON) 20 MEQ packet Take 20 mEq by mouth daily.    Marland Kitchen torsemide (DEMADEX) 20 MG tablet Take 1 tablet (20 mg total) by mouth daily. 30 tablet 6   No current facility-administered medications for this visit.    Discontinued Meds:   There are no discontinued medications.  Patient Active Problem List   Diagnosis Date Noted  . Foreign body (FB) in soft tissue 04/30/2014  . Atrial flutter 04/28/2014  . AKI (acute kidney injury) 04/28/2014  . Streptococcus viridans infection 04/28/2014  . Bacteremia due to Streptococcus 04/28/2014  . Hepatic cirrhosis 04/28/2014  .  Alcohol abuse 04/28/2014  . Angioedema 04/28/2014  . DVT (deep venous thrombosis)   . Acute combined systolic and diastolic CHF, NYHA class 4 04/13/2014    LABS    Component Value Date/Time   NA 141 07/03/2014 0848   NA 137 05/25/2014 1022   NA 137 05/14/2014 0800   K 3.7 07/03/2014 0848   K 3.8 05/25/2014 1022   K 4.0 05/14/2014 0800   CL 103 07/03/2014 0848   CL 102 05/25/2014 1022   CL 102 05/14/2014 0800   CO2 28 07/03/2014 0848   CO2 26 05/25/2014 1022   CO2 28 05/14/2014 0800   GLUCOSE 141* 07/03/2014 0848   GLUCOSE 148* 05/25/2014 1022   GLUCOSE 181* 05/14/2014 0800   BUN 18 07/03/2014 0848   BUN 19 05/25/2014 1022   BUN 29* 05/14/2014 0800   CREATININE 1.31 07/03/2014 0848   CREATININE 1.14 05/25/2014 1022   CREATININE 1.21 05/14/2014 0800   CREATININE 1.35 05/10/2014 1431   CREATININE 1.22 05/10/2014 0700   CALCIUM 8.9 07/03/2014 0848   CALCIUM 8.5 05/25/2014 1022   CALCIUM 9.1 05/14/2014 0800   GFRNONAA 63* 05/14/2014 0800   GFRNONAA 55* 05/10/2014 1431   GFRNONAA 62* 05/10/2014  0700   GFRAA 73* 05/14/2014 0800   GFRAA 64* 05/10/2014 1431   GFRAA 72* 05/10/2014 0700   CMP     Component Value Date/Time   NA 141 07/03/2014 0848   K 3.7 07/03/2014 0848   CL 103 07/03/2014 0848   CO2 28 07/03/2014 0848   GLUCOSE 141* 07/03/2014 0848   BUN 18 07/03/2014 0848   CREATININE 1.31 07/03/2014 0848   CREATININE 1.21 05/14/2014 0800   CALCIUM 8.9 07/03/2014 0848   PROT 6.4 04/23/2014 0600   ALBUMIN 3.1* 04/23/2014 0600   AST 32 04/23/2014 0600   ALT 37 04/23/2014 0600   ALKPHOS 66 04/23/2014 0600   BILITOT 1.8* 04/23/2014 0600   GFRNONAA 63* 05/14/2014 0800   GFRAA 73* 05/14/2014 0800       Component Value Date/Time   WBC 6.6 05/07/2014 0630   WBC 14.7* 04/27/2014 0704   WBC 18.0* 04/26/2014 1055   HGB 9.4* 05/07/2014 0630   HGB 8.2* 04/27/2014 0704   HGB 8.7* 04/26/2014 1055   HCT 28.9* 05/07/2014 0630   HCT 24.7* 04/27/2014 0704   HCT 25.8*  04/26/2014 1055   MCV 97.3 05/07/2014 0630   MCV 96.5 04/27/2014 0704   MCV 96.6 04/26/2014 1055    Lipid Panel     Component Value Date/Time   CHOL 145 04/14/2014 0427   TRIG 60 04/14/2014 0427   HDL 43 04/14/2014 0427   CHOLHDL 3.4 04/14/2014 0427   VLDL 12 04/14/2014 0427   LDLCALC 90 04/14/2014 0427    ABG    Component Value Date/Time   PHART 7.461* 04/22/2014 0502   PCO2ART 41.5 04/22/2014 0502   PO2ART 100.0 04/22/2014 0502   HCO3 29.2* 04/22/2014 0502   TCO2 26.3 04/22/2014 0502   ACIDBASEDEF 1.1 04/18/2014 0346   O2SAT 97.6 04/22/2014 0502     Lab Results  Component Value Date   TSH 1.213 04/13/2014   BNP (last 3 results)  Recent Labs  04/13/14 1110  BNP 617.0*    ProBNP (last 3 results) No results for input(s): PROBNP in the last 8760 hours.  Cardiac Panel (last 3 results) No results for input(s): CKTOTAL, CKMB, TROPONINI, RELINDX in the last 72 hours.  Iron/TIBC/Ferritin/ %Sat No results found for: IRON, TIBC, FERRITIN, IRONPCTSAT   EKG Orders placed or performed in visit on 05/22/14  . EKG 12-Lead     Prior Assessment and Plan Problem List as of 07/31/2014    Acute combined systolic and diastolic CHF, NYHA class 4   DVT (deep venous thrombosis)   Atrial flutter   AKI (acute kidney injury)   Streptococcus viridans infection   Bacteremia due to Streptococcus   Hepatic cirrhosis   Alcohol abuse   Angioedema   Foreign body (FB) in soft tissue       Imaging: [place] Arterial Seg Single  07/26/2014   CLINICAL DATA:  59 year old male with chronic atrial fibrillation and leg weakness/ giving out after walking approximately 1/2 block  EXAM: NONINVASIVE PHYSIOLOGIC VASCULAR STUDY OF BILATERAL LOWER EXTREMITIES  TECHNIQUE: Evaluation of both lower extremities were performed at rest, including calculation of ankle-brachial indices with single level Doppler, pressure and pulse volume recording.  COMPARISON:  Prior CT abdomen/ pelvis 04/15/2014; duplex  venous ultrasound 04/16/2014  FINDINGS: Right ABI:  1.39  Left ABI:  1.0  Right Lower Extremity:  Normal triphasic arterial waveforms.  Left Lower Extremity:  Normal triphasic arterial waveforms.  IMPRESSION: Normal bilateral resting ankle-brachial indices and arterial waveforms. No evidence of  clinically significant occlusive peripheral arterial disease.  Signed,  Sterling Big, MD  [facility]  [facility]   Electronically Signed   By: Malachy Moan M.D.   On: 07/26/2014 15:50

## 2014-07-31 NOTE — Progress Notes (Signed)
Cardiology Office Note   Date:  07/31/2014   ID:  [name], DOB Jun 20, 1955, MRN [id number]  PCP:  Avon Gully, MD  Cardiologist:  Arlington Calix, NP   Chief Complaint  Patient presents with  . Atrial Fibrillation      History of Present Illness: Gabriel Reynolds is a 59 y.o. male who presents for ongoing assessment and management of atrial flutter, mixed CHF, NICM with EF of 25%, ETOH abuse, hypertension, with hx of diabetes. He was hospitalized in for ACE induced angioedema. He was last seen 06/29/2014 and was found to have started drinking again and not adhering to low sodium diet.  Follow labs were completed Na 141, Potassium 4.1, Creatinine 1.31. He complained of leg pain. ABI were completed.which were found to be normal.   He comes today complaining of numbness on left side of his face, left side of his tongue, and hand. He denies trouble swallowing, vision disturbances or weakness. He is able to move his tongue. He has not had issues with chewing or biting his tongue. He states it has been going on for a week or more. He has been medically complaint. He states he still drinks but not as much. He is uncertain about his diabetes control. .   Past Medical History  Diagnosis Date  . Hypertension   . Diabetes mellitus   . Acute combined systolic and diastolic CHF, NYHA class 4 04/13/2014  . Angioedema 04/28/2014  . Alcohol abuse 04/28/2014  . Atrial flutter 04/28/2014  . Bacteremia due to Streptococcus 04/28/2014  . DVT (deep venous thrombosis)   . Hepatic cirrhosis 04/28/2014  . Streptococcus viridans infection 04/28/2014  . Foreign body (FB) in soft tissue 04/30/2014    Right upper arm soft tissue, failed PICC line placement, retained guide-wire fragment.     Past Surgical History  Procedure Laterality Date  . Hand surgery       Current Outpatient Prescriptions  Medication Sig Dispense Refill  . apixaban (ELIQUIS) 5 MG TABS tablet Take 1 tablet (5 mg total) by mouth 2 (two)  times daily. 60 tablet 6  . b complex vitamins tablet Take 1 tablet by mouth daily.    . carvedilol (COREG) 12.5 MG tablet Take 1 tablet (12.5 mg total) by mouth 2 (two) times daily with a meal. 60 tablet 10  . folic acid (FOLVITE) 1 MG tablet Take 1 tablet (1 mg total) by mouth daily.    . furosemide (LASIX) 20 MG tablet   3  . insulin detemir (LEVEMIR) 100 UNIT/ML injection Inject 0.05 mLs (5 Units total) into the skin daily.    . potassium chloride (KLOR-CON) 20 MEQ packet Take 20 mEq by mouth daily.    Marland Kitchen torsemide (DEMADEX) 20 MG tablet Take 1 tablet (20 mg total) by mouth daily. 30 tablet 6   No current facility-administered medications for this visit.    Allergies:   Ace inhibitors and Other    Social History:  The patient  reports that he has never smoked. He has never used smokeless tobacco. He reports that he drinks about 4.2 oz of alcohol per week. He reports that he does not use illicit drugs.   Family History:  The patient's family history includes Diabetes in his mother.    ROS: .   All other systems are reviewed and negative.Unless otherwise mentioned in  H&P above.   PHYSICAL EXAM: VS:  BP 120/80 mmHg  Pulse 76  Ht  (1.854 m)  Wt  263 lb (119.296 kg)  BMI 34.71 kg/m2  SpO2 97% , BMI Body mass index is 34.71 kg/(m^2). GEN: Well nourished, well developed, in no acute distress HEENT: normal Neck: no JVD, carotid bruits, or masses Cardiac: RRR; no murmurs, rubs, or gallops,no edema  Respiratory:  clear to auscultation bilaterally, normal work of breathing GI: soft, nontender, nondistended, + BS MS: no deformity or atrophy Skin: warm and dry, no rash Neuro:  Strength and sensation are intact. He has not weakness in his face, can smile and move his tongue. No focal weakness is noted.  Psych: euthymic mood, full affect   Recent Labs: 04/13/2014: B Natriuretic Peptide 617.0*; TSH 1.213 04/22/2014: Magnesium 2.0 04/23/2014: ALT 37 05/07/2014: Hemoglobin 9.4*;  Platelets 161 07/03/2014: BUN 18; Creat 1.31; Potassium 3.7; Sodium 141    Lipid Panel    Component Value Date/Time   CHOL 145 04/14/2014 0427   TRIG 60 04/14/2014 0427   HDL 43 04/14/2014 0427   CHOLHDL 3.4 04/14/2014 0427   VLDL 12 04/14/2014 0427   LDLCALC 90 04/14/2014 0427      Wt Readings from Last 3 Encounters:  07/31/14 263 lb (119.296 kg)  06/29/14 267 lb (121.11 kg)  05/22/14 249 lb (112.946 kg)      Other studies Reviewed: Additional studies/ records that were reviewed today include: None Review of the above records demonstrates: N/A   ASSESSMENT AND PLAN:  1. NICM:  No evidence of fluid overload or edema. He will continue current medications. I will check a BMET and CBC. He is only on torsemide, not lasix. Continue BB,potassium.   2. Facial Numbness: Complains of feeling numbness in his left jaw, left side of tongue, left side of lower lips. He denies other focal numbness with the exception of left hamd and forearm. Doubt TIA or CVA but because he is on Eliquis I will check for bleeding. CT of the brain will be ordered.  3. Diabetes: Not sure that this is well controlled. Facial numbness can be diabetic neuralgia as well. Checking CT. Should see PCP for diabetic management.  Current medicines are reviewed at length with the patient today.    Labs/ tests ordered today include: CBC,BMET, CT of the head  Disposition:   FU with  1 month.  Signed, Joni ReiningKathryn Leighla Chestnutt, NP  07/31/2014 2:07 PM    [facility] [address], [place], [place] [address] Phone: [phone]; Fax: [phone]

## 2014-08-03 ENCOUNTER — Ambulatory Visit (HOSPITAL_COMMUNITY)
Admission: RE | Admit: 2014-08-03 | Discharge: 2014-08-03 | Disposition: A | Discharge status: Home / Self Care | Payer: Medicare Other | Source: Ambulatory Visit | Attending: Adult Health | Admitting: Adult Health

## 2014-08-03 DIAGNOSIS — R200 Anesthesia of skin: Secondary | ICD-10-CM | POA: Diagnosis not present

## 2014-08-03 DIAGNOSIS — R202 Paresthesia of skin: Secondary | ICD-10-CM | POA: Diagnosis not present

## 2014-09-03 ENCOUNTER — Encounter: Payer: Medicare Other | Admitting: Adult Health

## 2014-09-03 NOTE — Progress Notes (Signed)
Cardiology Office Note   Date:  09/03/2014   ID:  JESHAWN MELUCCI, DOB 09/28/1955, MRN [id number]  PCP:  Avon Gully, MD  Cardiologist:  Arlington Calix, NP   NO Show

## 2014-10-23 DIAGNOSIS — K7030 Alcoholic cirrhosis of liver without ascites: Secondary | ICD-10-CM | POA: Diagnosis not present

## 2014-10-23 DIAGNOSIS — E1165 Type 2 diabetes mellitus with hyperglycemia: Secondary | ICD-10-CM | POA: Diagnosis not present

## 2014-10-23 DIAGNOSIS — I5022 Chronic systolic (congestive) heart failure: Secondary | ICD-10-CM | POA: Diagnosis not present

## 2014-10-23 DIAGNOSIS — I10 Essential (primary) hypertension: Secondary | ICD-10-CM | POA: Diagnosis not present

## 2014-12-18 ENCOUNTER — Other Ambulatory Visit: Payer: Self-pay | Admitting: Adult Health

## 2015-01-22 DIAGNOSIS — I5022 Chronic systolic (congestive) heart failure: Secondary | ICD-10-CM | POA: Diagnosis not present

## 2015-01-22 DIAGNOSIS — E1165 Type 2 diabetes mellitus with hyperglycemia: Secondary | ICD-10-CM | POA: Diagnosis not present

## 2015-01-22 DIAGNOSIS — J00 Acute nasopharyngitis [common cold]: Secondary | ICD-10-CM | POA: Diagnosis not present

## 2015-01-22 DIAGNOSIS — I4891 Unspecified atrial fibrillation: Secondary | ICD-10-CM | POA: Diagnosis not present

## 2015-01-22 DIAGNOSIS — K7030 Alcoholic cirrhosis of liver without ascites: Secondary | ICD-10-CM | POA: Diagnosis not present

## 2015-03-02 ENCOUNTER — Other Ambulatory Visit: Payer: Self-pay | Admitting: Adult Health

## 2015-03-19 ENCOUNTER — Encounter: Payer: Self-pay | Admitting: Adult Health

## 2015-03-19 ENCOUNTER — Ambulatory Visit (INDEPENDENT_AMBULATORY_CARE_PROVIDER_SITE_OTHER): Payer: Medicare Other | Admitting: Adult Health

## 2015-03-19 VITALS — BP 112/80 | HR 87 | Ht 73.0 in | Wt 280.0 lb

## 2015-03-19 DIAGNOSIS — Z794 Long term (current) use of insulin: Secondary | ICD-10-CM

## 2015-03-19 DIAGNOSIS — Z131 Encounter for screening for diabetes mellitus: Secondary | ICD-10-CM

## 2015-03-19 DIAGNOSIS — E0800 Diabetes mellitus due to underlying condition with hyperosmolarity without nonketotic hyperglycemic-hyperosmolar coma (NKHHC): Secondary | ICD-10-CM | POA: Diagnosis not present

## 2015-03-19 DIAGNOSIS — I519 Heart disease, unspecified: Secondary | ICD-10-CM | POA: Diagnosis not present

## 2015-03-19 DIAGNOSIS — I4892 Unspecified atrial flutter: Secondary | ICD-10-CM

## 2015-03-19 MED ORDER — CARVEDILOL 12.5 MG PO TABS: 12.5000 mg | ORAL_TABLET | Freq: Two times a day (BID) | ORAL | Status: DC

## 2015-03-19 MED ORDER — TORSEMIDE 20 MG PO TABS: 20.0000 mg | ORAL_TABLET | Freq: Every day | ORAL | Status: DC

## 2015-03-19 MED ORDER — APIXABAN 5 MG PO TABS: 5.0000 mg | ORAL_TABLET | Freq: Two times a day (BID) | ORAL | Status: DC

## 2015-03-19 NOTE — Progress Notes (Signed)
Cardiology Office Note   Date:  03/19/2015   ID:  Gabriel Reynolds, DOB 1955/11/05, MRN [id number]  PCP:  Avon Gully, MD  Cardiologist:  Arlington Calix, NP   No chief complaint on file.     History of Present Illness: Gabriel Reynolds is a 60 y.o. male who presents for ongoing assessment and management of atrial flutter, mixed, CHF, nonischemic cardiomyopathy, with an EF of 25%, history of EtOH abuse, hypertension, and diabetes.  His last seen in the office in July 2016, he denies facial numbness and shooting on his tongue.  He was referred to neurology.  Otherwise, he was medically compliant.  He continued to drink alcohol although not in high quantities.  He comes today without any complaints.  He is medically compliant.  He has had some sinus congestion and cough.  He is being followed by Dr. Felecia Shelling for his diabetes.  He has not had recent labs.  He denies any bleeding or bruising.  Past Medical History  Diagnosis Date  . Hypertension   . Diabetes mellitus   . Acute combined systolic and diastolic CHF, NYHA class 4 (HCC) 04/13/2014  . Angioedema 04/28/2014  . Alcohol abuse 04/28/2014  . Atrial flutter (HCC) 04/28/2014  . Bacteremia due to Streptococcus 04/28/2014  . DVT (deep venous thrombosis) (HCC)   . Hepatic cirrhosis (HCC) 04/28/2014  . Streptococcus viridans infection 04/28/2014  . Foreign body (FB) in soft tissue 04/30/2014    Right upper arm soft tissue, failed PICC line placement, retained guide-wire fragment.     Past Surgical History  Procedure Laterality Date  . Hand surgery       Current Outpatient Prescriptions  Medication Sig Dispense Refill  . apixaban (ELIQUIS) 5 MG TABS tablet Take 1 tablet (5 mg total) by mouth 2 (two) times daily. 60 tablet 6  . b complex vitamins tablet Take 1 tablet by mouth daily.    . carvedilol (COREG) 12.5 MG tablet Take 1 tablet (12.5 mg total) by mouth 2 (two) times daily with a meal. 60 tablet 6  . folic acid (FOLVITE) 1 MG tablet  Take 1 tablet (1 mg total) by mouth daily.    . insulin detemir (LEVEMIR) 100 UNIT/ML injection Inject 0.05 mLs (5 Units total) into the skin daily.    . potassium chloride (KLOR-CON) 20 MEQ packet Take 20 mEq by mouth daily.    Marland Kitchen torsemide (DEMADEX) 20 MG tablet Take 1 tablet (20 mg total) by mouth daily. 30 tablet 6   No current facility-administered medications for this visit.    Allergies:   Ace inhibitors and Other    Social History:  The patient  reports that he has never smoked. He has never used smokeless tobacco. He reports that he drinks about 4.2 oz of alcohol per week. He reports that he does not use illicit drugs.   Family History:  The patient's family history includes Diabetes in his mother.    ROS: All other systems are reviewed and negative. Unless otherwise mentioned in H&P    PHYSICAL EXAM: VS:  BP 112/80 mmHg  Pulse 87  Ht  (1.854 m)  Wt 280 lb (127.007 kg)  BMI 36.95 kg/m2  SpO2 96% , BMI Body mass index is 36.95 kg/(m^2). GEN: Well nourished, well developed, in no acute distress HEENT: normal Neck: no JVD, carotid bruits, or masses Cardiac:RRR; no murmurs, rubs, or gallops,no edema  Respiratory: Clear to auscultation bilaterally, normal work of breathing. No upper airway congestion. GI:  soft, nontender, nondistended, + BS MS: no deformity or atrophy Skin: warm and dry, no rash Neuro:  Strength and sensation are intact Psych: euthymic mood, full affect  Recent Labs: 04/13/2014: B Natriuretic Peptide 617.0*; TSH 1.213 04/22/2014: Magnesium 2.0 04/23/2014: ALT 37 07/31/2014: BUN 26*; Creatinine, Ser 1.44*; Hemoglobin 14.0; Platelets 136*; Potassium 3.8; Sodium 138    Lipid Panel    Component Value Date/Time   CHOL 145 04/14/2014 0427   TRIG 60 04/14/2014 0427   HDL 43 04/14/2014 0427   CHOLHDL 3.4 04/14/2014 0427   VLDL 12 04/14/2014 0427   LDLCALC 90 04/14/2014 0427      Wt Readings from Last 3 Encounters:  03/19/15 280 lb (127.007 kg)   07/31/14 263 lb (119.296 kg)  06/29/14 267 lb (121.11 kg)      ASSESSMENT AND PLAN:  1.  Chronic systolic heart failure, with nonischemic cardiomyopathy: he does not appear to be fluid overloaded, lung sounds are clear despite his complaints of cough and congestion.  I will repeat his echocardiogram to evaluate further for his systolic function and hopeful improvement on his medication regimen.  Will also repeat a CT not to evaluate liver functions along with kidney function.  He will followup with Dr. Wyline Mood in 6 months.  2. Atrial flutter: heart rate is regular today.  He will continue on rate control with carvedilol, and continue ELIQUIS 5 mg twice a day.  I will check a CBC.  3. Diabetes: he states that his blood glucose monitor is broken.  I advised that he replaced the batteries are replaced.  The machine in order to have a consistent tracking up his blood glucose at home.  He is to followup with Dr. Felecia Shelling for ongoing treatment.  I will check a hemoglobin A1c with the labs, sending all  results to Dr. Felecia Shelling, as well.  Current medicines are reviewed at length with the patient today.    Labs/ tests ordered today include: CMET, hemoglobin A1c, CBC, and echo  Orders Placed This Encounter  Procedures  . CBC  . Comprehensive metabolic panel  . HgB A1c  . Echocardiogram     Disposition:   FU with in 6 months, with Dr. Wyline Mood   Signed, [name], NP  03/19/2015 1:42 PM    [facility] [address] [place], [place] [address] Phone: [phone]; Fax: [phone]

## 2015-03-19 NOTE — Progress Notes (Signed)
Name: Gabriel Reynolds    DOB: 08/09/1955  Age: 60 y.o.  MR#: [id number]       PCP:  Avon Gully, MD      Insurance: Payor: MEDICARE / Plan: MEDICARE PART A AND B / Product Type: *No Product type* /   CC:   No chief complaint on file.   VS Filed Vitals:   03/19/15 1318  BP: 112/80  Pulse: 87  Height:  (1.854 m)  Weight: 280 lb (127.007 kg)  SpO2: 96%    Weights Current Weight  03/19/15 280 lb (127.007 kg)  07/31/14 263 lb (119.296 kg)  06/29/14 267 lb (121.11 kg)    Blood Pressure  BP Readings from Last 3 Encounters:  03/19/15 112/80  07/31/14 120/80  06/29/14 112/78     Admit date:  (Not on file) Last encounter with RMR:  03/02/2015   Allergy Ace inhibitors and Other  Current Outpatient Prescriptions  Medication Sig Dispense Refill  . b complex vitamins tablet Take 1 tablet by mouth daily.    . carvedilol (COREG) 12.5 MG tablet Take 1 tablet (12.5 mg total) by mouth 2 (two) times daily with a meal. 60 tablet 10  . ELIQUIS 5 MG TABS tablet TAKE 1 TABLET(5 MG) BY MOUTH TWICE DAILY 60 tablet 0  . folic acid (FOLVITE) 1 MG tablet Take 1 tablet (1 mg total) by mouth daily.    . insulin detemir (LEVEMIR) 100 UNIT/ML injection Inject 0.05 mLs (5 Units total) into the skin daily.    . potassium chloride (KLOR-CON) 20 MEQ packet Take 20 mEq by mouth daily.    Marland Kitchen torsemide (DEMADEX) 20 MG tablet Take 1 tablet (20 mg total) by mouth daily. 30 tablet 6   No current facility-administered medications for this visit.    Discontinued Meds:   There are no discontinued medications.  Patient Active Problem List   Diagnosis Date Noted  . Foreign body (FB) in soft tissue 04/30/2014  . Atrial flutter (HCC) 04/28/2014  . AKI (acute kidney injury) (HCC) 04/28/2014  . Streptococcus viridans infection 04/28/2014  . Bacteremia due to Streptococcus 04/28/2014  . Hepatic cirrhosis (HCC) 04/28/2014  . Alcohol abuse 04/28/2014  . Angioedema 04/28/2014  . DVT (deep venous thrombosis)  (HCC)   . Acute combined systolic and diastolic CHF, NYHA class 4 (HCC) 04/13/2014    LABS    Component Value Date/Time   NA 138 07/31/2014 1440   NA 141 07/03/2014 0848   NA 137 05/25/2014 1022   K 3.8 07/31/2014 1440   K 3.7 07/03/2014 0848   K 3.8 05/25/2014 1022   CL 101 07/31/2014 1440   CL 103 07/03/2014 0848   CL 102 05/25/2014 1022   CO2 28 07/31/2014 1440   CO2 28 07/03/2014 0848   CO2 26 05/25/2014 1022   GLUCOSE 269* 07/31/2014 1440   GLUCOSE 141* 07/03/2014 0848   GLUCOSE 148* 05/25/2014 1022   BUN 26* 07/31/2014 1440   BUN 18 07/03/2014 0848   BUN 19 05/25/2014 1022   CREATININE 1.44* 07/31/2014 1440   CREATININE 1.31 07/03/2014 0848   CREATININE 1.14 05/25/2014 1022   CREATININE 1.21 05/14/2014 0800   CREATININE 1.35 05/10/2014 1431   CALCIUM 8.9 07/31/2014 1440   CALCIUM 8.9 07/03/2014 0848   CALCIUM 8.5 05/25/2014 1022   GFRNONAA 51* 07/31/2014 1440   GFRNONAA 63* 05/14/2014 0800   GFRNONAA 55* 05/10/2014 1431   GFRAA 59* 07/31/2014 1440   GFRAA 73* 05/14/2014 0800   GFRAA 64*  05/10/2014 1431   CMP     Component Value Date/Time   NA 138 07/31/2014 1440   K 3.8 07/31/2014 1440   CL 101 07/31/2014 1440   CO2 28 07/31/2014 1440   GLUCOSE 269* 07/31/2014 1440   BUN 26* 07/31/2014 1440   CREATININE 1.44* 07/31/2014 1440   CREATININE 1.31 07/03/2014 0848   CALCIUM 8.9 07/31/2014 1440   PROT 6.4 04/23/2014 0600   ALBUMIN 3.1* 04/23/2014 0600   AST 32 04/23/2014 0600   ALT 37 04/23/2014 0600   ALKPHOS 66 04/23/2014 0600   BILITOT 1.8* 04/23/2014 0600   GFRNONAA 51* 07/31/2014 1440   GFRAA 59* 07/31/2014 1440       Component Value Date/Time   WBC 6.7 07/31/2014 1440   WBC 6.6 05/07/2014 0630   WBC 14.7* 04/27/2014 0704   HGB 14.0 07/31/2014 1440   HGB 9.4* 05/07/2014 0630   HGB 8.2* 04/27/2014 0704   HCT 40.9 07/31/2014 1440   HCT 28.9* 05/07/2014 0630   HCT 24.7* 04/27/2014 0704   MCV 85.4 07/31/2014 1440   MCV 97.3 05/07/2014 0630    MCV 96.5 04/27/2014 0704    Lipid Panel     Component Value Date/Time   CHOL 145 04/14/2014 0427   TRIG 60 04/14/2014 0427   HDL 43 04/14/2014 0427   CHOLHDL 3.4 04/14/2014 0427   VLDL 12 04/14/2014 0427   LDLCALC 90 04/14/2014 0427    ABG    Component Value Date/Time   PHART 7.461* 04/22/2014 0502   PCO2ART 41.5 04/22/2014 0502   PO2ART 100.0 04/22/2014 0502   HCO3 29.2* 04/22/2014 0502   TCO2 26.3 04/22/2014 0502   ACIDBASEDEF 1.1 04/18/2014 0346   O2SAT 97.6 04/22/2014 0502     Lab Results  Component Value Date   TSH 1.213 04/13/2014   BNP (last 3 results)  Recent Labs  04/13/14 1110  BNP 617.0*    ProBNP (last 3 results) No results for input(s): PROBNP in the last 8760 hours.  Cardiac Panel (last 3 results) No results for input(s): CKTOTAL, CKMB, TROPONINI, RELINDX in the last 72 hours.  Iron/TIBC/Ferritin/ %Sat No results found for: IRON, TIBC, FERRITIN, IRONPCTSAT   EKG Orders placed or performed in visit on 05/22/14  . EKG 12-Lead     Prior Assessment and Plan Problem List as of 03/19/2015      Cardiovascular and Mediastinum   Acute combined systolic and diastolic CHF, NYHA class 4 (HCC)   DVT (deep venous thrombosis) (HCC)   Atrial flutter (HCC)     Digestive   Hepatic cirrhosis (HCC)     Genitourinary   AKI (acute kidney injury) (HCC)     Other   Streptococcus viridans infection   Bacteremia due to Streptococcus   Alcohol abuse   Angioedema   Foreign body (FB) in soft tissue       Imaging: No results found.

## 2015-03-19 NOTE — Patient Instructions (Signed)
Your physician wants you to follow-up in: 6 months with Dr Lurena Joiner will receive a reminder letter in the mail two months in advance. If you don't receive a letter, please call our office to schedule the follow-up appointment.    Your physician recommends that you continue on your current medications as directed. Please refer to the Current Medication list given to you today.     Your physician has requested that you have an echocardiogram. Echocardiography is a painless test that uses sound waves to create images of your heart. It provides your doctor with information about the size and shape of your heart and how well your heart's chambers and valves are working. This procedure takes approximately one hour. There are no restrictions for this procedure.   Get lab work now    Thank you for [facility] !

## 2015-03-20 LAB — HEMOGLOBIN A1C
HEMOGLOBIN A1C: 8.4 % — AB (ref <5.7)
MEAN PLASMA GLUCOSE: 194 mg/dL — AB (ref <117)

## 2015-03-20 LAB — CBC
HCT: 40.8 % (ref 39.0–52.0)
HEMOGLOBIN: 13.8 g/dL (ref 13.0–17.0)
MCH: 30.3 pg (ref 26.0–34.0)
MCHC: 33.8 g/dL (ref 30.0–36.0)
MCV: 89.5 fL (ref 78.0–100.0)
MPV: 10.6 fL (ref 8.6–12.4)
Platelets: 158 10*3/uL (ref 150–400)
RBC: 4.56 MIL/uL (ref 4.22–5.81)
RDW: 13.6 % (ref 11.5–15.5)
WBC: 7.1 10*3/uL (ref 4.0–10.5)

## 2015-03-20 LAB — COMPREHENSIVE METABOLIC PANEL
ALBUMIN: 3.9 g/dL (ref 3.6–5.1)
ALT: 43 U/L (ref 9–46)
AST: 72 U/L — ABNORMAL HIGH (ref 10–35)
Alkaline Phosphatase: 98 U/L (ref 40–115)
BUN: 19 mg/dL (ref 7–25)
CALCIUM: 9.5 mg/dL (ref 8.6–10.3)
CHLORIDE: 102 mmol/L (ref 98–110)
CO2: 27 mmol/L (ref 20–31)
Creat: 1.54 mg/dL — ABNORMAL HIGH (ref 0.70–1.25)
Glucose, Bld: 216 mg/dL — ABNORMAL HIGH (ref 65–99)
POTASSIUM: 4.5 mmol/L (ref 3.5–5.3)
Sodium: 137 mmol/L (ref 135–146)
TOTAL PROTEIN: 7 g/dL (ref 6.1–8.1)
Total Bilirubin: 1.1 mg/dL (ref 0.2–1.2)

## 2015-03-21 ENCOUNTER — Telehealth: Payer: Self-pay | Admitting: *Deleted

## 2015-03-21 ENCOUNTER — Ambulatory Visit (HOSPITAL_COMMUNITY)
Admission: RE | Admit: 2015-03-21 | Discharge: 2015-03-21 | Disposition: A | Discharge status: Home / Self Care | Payer: Medicare Other | Source: Ambulatory Visit | Attending: Adult Health | Admitting: Adult Health

## 2015-03-21 DIAGNOSIS — I5020 Unspecified systolic (congestive) heart failure: Secondary | ICD-10-CM | POA: Insufficient documentation

## 2015-03-21 DIAGNOSIS — I081 Rheumatic disorders of both mitral and tricuspid valves: Secondary | ICD-10-CM | POA: Diagnosis not present

## 2015-03-21 DIAGNOSIS — I4892 Unspecified atrial flutter: Secondary | ICD-10-CM | POA: Insufficient documentation

## 2015-03-21 DIAGNOSIS — I519 Heart disease, unspecified: Secondary | ICD-10-CM | POA: Diagnosis not present

## 2015-03-21 NOTE — Telephone Encounter (Signed)
Called patient with test results. Phone has fast busy tone.

## 2015-03-21 NOTE — Telephone Encounter (Signed)
-----   Message from Jodelle Gross, NP sent at 03/21/2015  4:08 PM EST ----- Only slight improvement in EF from 20-25% to 30-35%. Increase carvedilol to 25 mg BID. May need to consider ICD when we seen him next once we optimize his medications.

## 2015-03-22 ENCOUNTER — Telehealth: Payer: Self-pay | Admitting: *Deleted

## 2015-03-22 MED ORDER — CARVEDILOL 25 MG PO TABS: 25.0000 mg | ORAL_TABLET | Freq: Two times a day (BID) | ORAL | Status: DC

## 2015-03-22 NOTE — Telephone Encounter (Signed)
-----   Message from Kathryn M Lawrence, NP sent at 03/21/2015  4:08 PM EST ----- Only slight improvement in EF from 20-25% to 30-35%. Increase carvedilol to 25 mg BID. May need to consider ICD when we seen him next once we optimize his medications. 

## 2015-03-22 NOTE — Telephone Encounter (Signed)
Called patient with test results. No answer. Left message to call back.  

## 2015-04-15 DIAGNOSIS — K7030 Alcoholic cirrhosis of liver without ascites: Secondary | ICD-10-CM | POA: Diagnosis not present

## 2015-04-15 DIAGNOSIS — I4891 Unspecified atrial fibrillation: Secondary | ICD-10-CM | POA: Diagnosis not present

## 2015-04-15 DIAGNOSIS — I10 Essential (primary) hypertension: Secondary | ICD-10-CM | POA: Diagnosis not present

## 2015-04-15 DIAGNOSIS — E1165 Type 2 diabetes mellitus with hyperglycemia: Secondary | ICD-10-CM | POA: Diagnosis not present

## 2015-04-15 DIAGNOSIS — I5022 Chronic systolic (congestive) heart failure: Secondary | ICD-10-CM | POA: Diagnosis not present

## 2015-07-15 DIAGNOSIS — I4891 Unspecified atrial fibrillation: Secondary | ICD-10-CM | POA: Diagnosis not present

## 2015-07-15 DIAGNOSIS — E1165 Type 2 diabetes mellitus with hyperglycemia: Secondary | ICD-10-CM | POA: Diagnosis not present

## 2015-07-15 DIAGNOSIS — K7030 Alcoholic cirrhosis of liver without ascites: Secondary | ICD-10-CM | POA: Diagnosis not present

## 2015-07-15 DIAGNOSIS — I10 Essential (primary) hypertension: Secondary | ICD-10-CM | POA: Diagnosis not present

## 2015-07-15 DIAGNOSIS — I5022 Chronic systolic (congestive) heart failure: Secondary | ICD-10-CM | POA: Diagnosis not present

## 2015-07-15 DIAGNOSIS — Z0000 Encounter for general adult medical examination without abnormal findings: Secondary | ICD-10-CM | POA: Diagnosis not present

## 2015-09-16 ENCOUNTER — Other Ambulatory Visit: Payer: Self-pay | Admitting: Cardiology

## 2015-09-16 MED ORDER — APIXABAN 5 MG PO TABS: 5.0000 mg | ORAL_TABLET | Freq: Two times a day (BID) | ORAL | 1 refills | Status: DC

## 2015-09-16 NOTE — Telephone Encounter (Signed)
Refilled eliquis 

## 2015-10-01 ENCOUNTER — Encounter: Payer: Self-pay | Admitting: Cardiology

## 2015-10-01 ENCOUNTER — Ambulatory Visit (INDEPENDENT_AMBULATORY_CARE_PROVIDER_SITE_OTHER): Payer: Medicare Other | Admitting: Cardiology

## 2015-10-01 VITALS — BP 122/78 | HR 76 | Ht 73.0 in | Wt 261.0 lb

## 2015-10-01 DIAGNOSIS — I4892 Unspecified atrial flutter: Secondary | ICD-10-CM

## 2015-10-01 DIAGNOSIS — F1010 Alcohol abuse, uncomplicated: Secondary | ICD-10-CM

## 2015-10-01 DIAGNOSIS — I5042 Chronic combined systolic (congestive) and diastolic (congestive) heart failure: Secondary | ICD-10-CM | POA: Diagnosis not present

## 2015-10-01 NOTE — Patient Instructions (Addendum)
Medication Instructions:  Your physician recommends that you continue on your current medications as directed. Please refer to the Current Medication list given to you today.   Labwork: NONE  Testing/Procedures: Your physician has requested that you have an echocardiogram. Echocardiography is a painless test that uses sound waves to create images of your heart. It provides your doctor with information about the size and shape of your heart and how well your heart's chambers and valves are working. This procedure takes approximately one hour. There are no restrictions for this procedure.   Follow-Up: Your physician recommends that you schedule a follow-up appointment in: 3 WEEKS WITH DR. BRANCH    Any Other Special Instructions Will Be Listed Below (If Applicable).     If you need a refill on your cardiac medications before your next appointment, please call your pharmacy.

## 2015-10-01 NOTE — Progress Notes (Signed)
Clinical Summary Gabriel Reynolds is a 60 y.o.male seen today for follow up of the following medical problems.   1. Chronic combined systolic/diastolic HF - 02/2015 echo LVEF 30-35%, grade II diastoilc dysfunction. Mildly dilated aortic root.  - he has not had an ischemic evaluation. Question of potentially EtOH or tachycardia mediated cardiomyopathy.  - history of angioedema on ACE-I - no SOB or DOE. No LE edema. Limiting sodium intake. Avoiding NSAIDs. Does not check daily weights.  - compliant with meds.   2. Aflutter - denies any palpitations - no bleeding on apixiban  3. EtOH abuse - he reports drinking 1-2 beers per day. His family member who is present seems to suggest he is drinking more.   Past Medical History:  Diagnosis Date  . Acute combined systolic and diastolic CHF, NYHA class 4 (HCC) 04/13/2014  . Alcohol abuse 04/28/2014  . Angioedema 04/28/2014  . Atrial flutter (HCC) 04/28/2014  . Bacteremia due to Streptococcus 04/28/2014  . Diabetes mellitus   . DVT (deep venous thrombosis) (HCC)   . Foreign body (FB) in soft tissue 04/30/2014   Right upper arm soft tissue, failed PICC line placement, retained guide-wire fragment.   . Hepatic cirrhosis (HCC) 04/28/2014  . Hypertension   . Streptococcus viridans infection 04/28/2014     Allergies  Allergen Reactions  . Ace Inhibitors Swelling  . Other Hives    wool     Current Outpatient Prescriptions  Medication Sig Dispense Refill  . apixaban (ELIQUIS) 5 MG TABS tablet Take 1 tablet (5 mg total) by mouth 2 (two) times daily. 180 tablet 1  . b complex vitamins tablet Take 1 tablet by mouth daily.    . carvedilol (COREG) 25 MG tablet Take 1 tablet (25 mg total) by mouth 2 (two) times daily. 180 tablet 3  . folic acid (FOLVITE) 1 MG tablet Take 1 tablet (1 mg total) by mouth daily.    . insulin detemir (LEVEMIR) 100 UNIT/ML injection Inject 0.05 mLs (5 Units total) into the skin daily.    . potassium chloride (KLOR-CON) 20 MEQ  packet Take 20 mEq by mouth daily.    Marland Kitchen torsemide (DEMADEX) 20 MG tablet Take 1 tablet (20 mg total) by mouth daily. 30 tablet 6   No current facility-administered medications for this visit.      Past Surgical History:  Procedure Laterality Date  . HAND SURGERY       Allergies  Allergen Reactions  . Ace Inhibitors Swelling  . Other Hives    wool      Family History  Problem Relation Age of Onset  . Diabetes Mother      Social History Gabriel Reynolds reports that he has never smoked. He has never used smokeless tobacco. Gabriel Reynolds reports that he drinks about 4.2 oz of alcohol per week .   Review of Systems CONSTITUTIONAL: No weight loss, fever, chills, weakness or fatigue.  HEENT: Eyes: No visual loss, blurred vision, double vision or yellow sclerae.No hearing loss, sneezing, congestion, runny nose or sore throat.  SKIN: No rash or itching.  CARDIOVASCULAR: per HPI RESPIRATORY: No shortness of breath, cough or sputum.  GASTROINTESTINAL: No anorexia, nausea, vomiting or diarrhea. No abdominal pain or blood.  GENITOURINARY: No burning on urination, no polyuria NEUROLOGICAL: No headache, dizziness, syncope, paralysis, ataxia, numbness or tingling in the extremities. No change in bowel or bladder control.  MUSCULOSKELETAL: No muscle, back pain, joint pain or stiffness.  LYMPHATICS: No enlarged nodes. No  history of splenectomy.  PSYCHIATRIC: No history of depression or anxiety.  ENDOCRINOLOGIC: No reports of sweating, cold or heat intolerance. No polyuria or polydipsia.  Marland Kitchen.   Physical Examination Vitals:   10/01/15 1056  BP: 122/78  Pulse: 76   Vitals:   10/01/15 1056  Weight: 261 lb (118.4 kg)  Height: 6\' 1"  (1.854 m)    Gen: resting comfortably, no acute distress HEENT: no scleral icterus, pupils equal round and reactive, no palptable cervical adenopathy,  CV: RRR, no m/r/g, no jvd Resp: Clear to auscultation bilaterally GI: abdomen is soft, non-tender,  non-distended, normal bowel sounds, no hepatosplenomegaly MSK: extremities are warm, no edema.  Skin: warm, no rash Neuro:  no focal deficits Psych: appropriate affect   Diagnostic Studies  02/2015 echo Study Conclusions  - Left ventricle: The cavity size was normal. Wall thickness was   increased in a pattern of moderate LVH. Systolic function was   moderately to severely reduced. The estimated ejection fraction   was in the range of 30% to 35%. Diffuse hypokinesis. Features are   consistent with a pseudonormal left ventricular filling pattern,   with concomitant abnormal relaxation and increased filling   pressure (grade 2 diastolic dysfunction). Doppler parameters are   consistent with high ventricular filling pressure. - Aorta: Mild aortic root dilatation. Aortic root dimension: 43 mm   (ED). - Left atrium: The atrium was mildly dilated.   Assessment and Plan  1. Chronic combined systolic/diastolic  HF - 02/2015 echo LVEF 30-35%, NYHA II-III. He has not had an ischemic evaluation, initally suspected possible EtOH or tachycardia medicated CM - ACE-I allergy due to angioedema, not on ARB due to case reported cross reactivity.  - repeat limited echo, if persistent LV dysfunction will need LHC/RHC. Would consider hydral/nitrates as well as aldactone.  2. Aflutter - no current symptoms, in NSR in clinic today - continue beta blocker. CHADS2Vasc score is 3, continue eliquis  3. EtOH abuse - counseled extensively on potential association with his heart failure, advised to quite.      F/u 3 week. Pending echo results consider LHC/RHC   Antoine PocheJonathan F. Yazir Koerber, M.D.

## 2015-10-11 ENCOUNTER — Ambulatory Visit (HOSPITAL_COMMUNITY): Admission: RE | Admit: 2015-10-11 | Payer: Medicare Other | Source: Ambulatory Visit

## 2015-10-22 ENCOUNTER — Other Ambulatory Visit (HOSPITAL_COMMUNITY)
Admission: RE | Admit: 2015-10-22 | Discharge: 2015-10-22 | Disposition: A | Discharge status: Home / Self Care | Payer: Medicare Other | Source: Ambulatory Visit | Attending: Adult Health | Admitting: Adult Health

## 2015-10-22 ENCOUNTER — Ambulatory Visit (INDEPENDENT_AMBULATORY_CARE_PROVIDER_SITE_OTHER): Payer: Medicare Other | Admitting: Adult Health

## 2015-10-22 ENCOUNTER — Encounter (HOSPITAL_COMMUNITY): Payer: Self-pay | Admitting: Emergency Medicine

## 2015-10-22 ENCOUNTER — Other Ambulatory Visit: Payer: Self-pay

## 2015-10-22 ENCOUNTER — Emergency Department (HOSPITAL_COMMUNITY)
Admission: EM | Admit: 2015-10-22 | Discharge: 2015-10-22 | Disposition: A | Discharge status: Home / Self Care | Payer: Medicare Other | Attending: Emergency Medicine | Admitting: Emergency Medicine

## 2015-10-22 ENCOUNTER — Encounter: Payer: Self-pay | Admitting: Adult Health

## 2015-10-22 VITALS — BP 130/78 | HR 73 | Ht 73.0 in | Wt 255.0 lb

## 2015-10-22 DIAGNOSIS — I130 Hypertensive heart and chronic kidney disease with heart failure and stage 1 through stage 4 chronic kidney disease, or unspecified chronic kidney disease: Secondary | ICD-10-CM | POA: Insufficient documentation

## 2015-10-22 DIAGNOSIS — I10 Essential (primary) hypertension: Secondary | ICD-10-CM | POA: Diagnosis not present

## 2015-10-22 DIAGNOSIS — I5041 Acute combined systolic (congestive) and diastolic (congestive) heart failure: Secondary | ICD-10-CM

## 2015-10-22 DIAGNOSIS — Z9114 Patient's other noncompliance with medication regimen: Secondary | ICD-10-CM | POA: Insufficient documentation

## 2015-10-22 DIAGNOSIS — E1165 Type 2 diabetes mellitus with hyperglycemia: Secondary | ICD-10-CM | POA: Diagnosis not present

## 2015-10-22 DIAGNOSIS — I4892 Unspecified atrial flutter: Secondary | ICD-10-CM | POA: Diagnosis not present

## 2015-10-22 DIAGNOSIS — E1122 Type 2 diabetes mellitus with diabetic chronic kidney disease: Secondary | ICD-10-CM | POA: Diagnosis not present

## 2015-10-22 DIAGNOSIS — Z794 Long term (current) use of insulin: Secondary | ICD-10-CM | POA: Diagnosis not present

## 2015-10-22 DIAGNOSIS — N189 Chronic kidney disease, unspecified: Secondary | ICD-10-CM | POA: Insufficient documentation

## 2015-10-22 DIAGNOSIS — Z79899 Other long term (current) drug therapy: Secondary | ICD-10-CM | POA: Diagnosis not present

## 2015-10-22 DIAGNOSIS — R739 Hyperglycemia, unspecified: Secondary | ICD-10-CM

## 2015-10-22 DIAGNOSIS — I129 Hypertensive chronic kidney disease with stage 1 through stage 4 chronic kidney disease, or unspecified chronic kidney disease: Secondary | ICD-10-CM | POA: Diagnosis not present

## 2015-10-22 LAB — URINALYSIS, ROUTINE W REFLEX MICROSCOPIC
Bilirubin Urine: NEGATIVE
Glucose, UA: 500 mg/dL — AB
Ketones, ur: NEGATIVE mg/dL
LEUKOCYTES UA: NEGATIVE
NITRITE: NEGATIVE
PROTEIN: NEGATIVE mg/dL
pH: 5.5 (ref 5.0–8.0)

## 2015-10-22 LAB — CBG MONITORING, ED
GLUCOSE-CAPILLARY: 479 mg/dL — AB (ref 65–99)
GLUCOSE-CAPILLARY: 446 mg/dL — AB (ref 65–99)
Glucose-Capillary: 544 mg/dL (ref 65–99)
Glucose-Capillary: 362 mg/dL — ABNORMAL HIGH (ref 65–99)

## 2015-10-22 LAB — BASIC METABOLIC PANEL
ANION GAP: 11 (ref 5–15)
ANION GAP: 13 (ref 5–15)
BUN: 40 mg/dL — ABNORMAL HIGH (ref 6–20)
BUN: 40 mg/dL — ABNORMAL HIGH (ref 6–20)
CALCIUM: 9.5 mg/dL (ref 8.9–10.3)
CHLORIDE: 87 mmol/L — AB (ref 101–111)
CO2: 24 mmol/L (ref 22–32)
CO2: 21 mmol/L — AB (ref 22–32)
Calcium: 9.5 mg/dL (ref 8.9–10.3)
Chloride: 88 mmol/L — ABNORMAL LOW (ref 101–111)
Creatinine, Ser: 2.28 mg/dL — ABNORMAL HIGH (ref 0.61–1.24)
Creatinine, Ser: 2.26 mg/dL — ABNORMAL HIGH (ref 0.61–1.24)
GFR, EST AFRICAN AMERICAN: 34 mL/min — AB (ref >60)
GFR, EST AFRICAN AMERICAN: 34 mL/min — AB (ref >60)
GFR, EST NON AFRICAN AMERICAN: 29 mL/min — AB (ref >60)
GFR, EST NON AFRICAN AMERICAN: 29 mL/min — AB (ref >60)
Glucose, Bld: 692 mg/dL (ref 65–99)
Glucose, Bld: 661 mg/dL (ref 65–99)
POTASSIUM: 4.8 mmol/L (ref 3.5–5.1)
Potassium: 4.5 mmol/L (ref 3.5–5.1)
SODIUM: 122 mmol/L — AB (ref 135–145)
SODIUM: 122 mmol/L — AB (ref 135–145)

## 2015-10-22 LAB — URINE MICROSCOPIC-ADD ON
Bacteria, UA: NONE SEEN
RBC / HPF: NONE SEEN RBC/hpf (ref 0–5)
WBC, UA: NONE SEEN WBC/hpf (ref 0–5)

## 2015-10-22 LAB — CBC
HCT: 37.3 % — ABNORMAL LOW (ref 39.0–52.0)
HEMOGLOBIN: 13.1 g/dL (ref 13.0–17.0)
MCH: 30.5 pg (ref 26.0–34.0)
MCHC: 35.1 g/dL (ref 30.0–36.0)
MCV: 86.9 fL (ref 78.0–100.0)
Platelets: 147 10*3/uL — ABNORMAL LOW (ref 150–400)
RBC: 4.29 MIL/uL (ref 4.22–5.81)
RDW: 12.3 % (ref 11.5–15.5)
WBC: 7.3 10*3/uL (ref 4.0–10.5)

## 2015-10-22 LAB — CBC WITH DIFFERENTIAL/PLATELET
BASOS ABS: 0 10*3/uL (ref 0.0–0.1)
Basophils Relative: 0 %
Eosinophils Absolute: 0.3 10*3/uL (ref 0.0–0.7)
Eosinophils Relative: 4 %
HCT: 38.4 % — ABNORMAL LOW (ref 39.0–52.0)
HEMOGLOBIN: 13.2 g/dL (ref 13.0–17.0)
LYMPHS ABS: 2.5 10*3/uL (ref 0.7–4.0)
LYMPHS PCT: 38 %
MCH: 30.1 pg (ref 26.0–34.0)
MCHC: 34.4 g/dL (ref 30.0–36.0)
MCV: 87.7 fL (ref 78.0–100.0)
Monocytes Absolute: 0.4 10*3/uL (ref 0.1–1.0)
Monocytes Relative: 5 %
NEUTROS PCT: 53 %
Neutro Abs: 3.5 10*3/uL (ref 1.7–7.7)
PLATELETS: 142 10*3/uL — AB (ref 150–400)
RBC: 4.38 MIL/uL (ref 4.22–5.81)
RDW: 12.3 % (ref 11.5–15.5)
WBC: 6.8 10*3/uL (ref 4.0–10.5)

## 2015-10-22 LAB — TROPONIN I: TROPONIN I: 0.03 ng/mL — AB (ref <0.03)

## 2015-10-22 MED ORDER — INSULIN ASPART 100 UNIT/ML ~~LOC~~ SOLN
10.0000 [IU] | Freq: Once | SUBCUTANEOUS | Status: AC
  Administered 2015-10-22: 10 [IU] via INTRAVENOUS
  Filled 2015-10-22: qty 1

## 2015-10-22 MED ORDER — SODIUM CHLORIDE 0.9 % IV BOLUS (SEPSIS)
1000.0000 mL | Freq: Once | INTRAVENOUS | Status: AC
  Administered 2015-10-22: 1000 mL via INTRAVENOUS

## 2015-10-22 MED ORDER — INSULIN ASPART 100 UNIT/ML ~~LOC~~ SOLN
20.0000 [IU] | Freq: Once | SUBCUTANEOUS | Status: AC
  Administered 2015-10-22: 20 [IU] via INTRAVENOUS
  Filled 2015-10-22: qty 1

## 2015-10-22 NOTE — Patient Instructions (Signed)
Your physician recommends that you schedule a follow-up appointment in: 3 Months Dr. Wyline MoodBranch  Your physician recommends that you continue on your current medications as directed. Please refer to the Current Medication list given to you today.  Your physician recommends that you return for lab work in: Today  Your physician has requested that you have an echocardiogram. Echocardiography is a painless test that uses sound waves to create images of your heart. It provides your doctor with information about the size and shape of your heart and how well your heart's chambers and valves are working. This procedure takes approximately one hour. There are no restrictions for this procedure.   If you need a refill on your cardiac medications before your next appointment, please call your pharmacy.  Thank you for choosing [facility]!

## 2015-10-22 NOTE — ED Triage Notes (Signed)
Pt had blood drawn today and was told to come to ED for evaluation.  Blood sugar read 600 according to pt. Pt denies any symptoms or pain.

## 2015-10-22 NOTE — ED Notes (Signed)
CRITICAL VALUE ALERT  Critical value received:  Glucose 661                                       Troponin 0.03   Date of notification:  10/22/2015  Time of notification:  1819  Critical value read back: Yes  Nurse who received alert:  Rory PercyS Quintasia Theroux RN  MD notified (1st page):  Particia NearingHaviland  Time of first page:  1820  MD notified (2nd page):  Time of second page:  Responding MD:  Particia NearingHaviland  Time MD responded:  [phone]

## 2015-10-22 NOTE — Discharge Instructions (Signed)
Take your medications as directed

## 2015-10-22 NOTE — Progress Notes (Signed)
Cardiology Office Note   Date:  10/22/2015   ID:  Gabriel Reynolds, DOB July 22, 1955, MRN [id number]  PCP:  Avon Gully, MD  Cardiologist:Branch/   [name], NP   Chief Complaint  Patient presents with  . Congestive Heart Failure  . Hypertension      History of Present Illness: Gabriel Reynolds is a 60 y.o. male who presents for ongoing assessment and management of chronic combined systolic and not systolic heart failure, most recent echo revealing LVEF of 30-35% with grade 2 diastolic dysfunction. Questionable tachycardia mediated cardiomyopathy. Patient is unable take ACE inhibitors due to angioedema. Other history includes atrial flutter on Eliquis. Also alcohol abuse.  On last office visit with Dr. Wyline Mood on 10/01/2015, echocardiogram was repeated to evaluate LV dysfunction and need for left heart cath/right heart cath. He was continued on other medications. He was counseled on EtOH abuse.  He denies any symptoms of rapid heart rate, palpitations, near syncope, chest pain, or edema. He states he is medically compliant. He is tolerating his medicine without problem.  He states he has not had any alcohol in greater than a month.  Past Medical History:  Diagnosis Date  . Acute combined systolic and diastolic CHF, NYHA class 4 (HCC) 04/13/2014  . Alcohol abuse 04/28/2014  . Angioedema 04/28/2014  . Atrial flutter (HCC) 04/28/2014  . Bacteremia due to Streptococcus 04/28/2014  . Diabetes mellitus   . DVT (deep venous thrombosis) (HCC)   . Foreign body (FB) in soft tissue 04/30/2014   Right upper arm soft tissue, failed PICC line placement, retained guide-wire fragment.   . Hepatic cirrhosis (HCC) 04/28/2014  . Hypertension   . Streptococcus viridans infection 04/28/2014    Past Surgical History:  Procedure Laterality Date  . HAND SURGERY       Current Outpatient Prescriptions  Medication Sig Dispense Refill  . apixaban (ELIQUIS) 5 MG TABS tablet Take 1 tablet (5 mg total) by mouth 2  (two) times daily. 180 tablet 1  . b complex vitamins tablet Take 1 tablet by mouth daily.    . carvedilol (COREG) 25 MG tablet Take 1 tablet (25 mg total) by mouth 2 (two) times daily. 180 tablet 3  . folic acid (FOLVITE) 1 MG tablet Take 1 tablet (1 mg total) by mouth daily.    . insulin detemir (LEVEMIR) 100 UNIT/ML injection Inject 0.05 mLs (5 Units total) into the skin daily.    . potassium chloride (KLOR-CON) 20 MEQ packet Take 20 mEq by mouth daily.    Marland Kitchen torsemide (DEMADEX) 20 MG tablet Take 1 tablet (20 mg total) by mouth daily. 30 tablet 6   No current facility-administered medications for this visit.     Allergies:   Ace inhibitors and Other    Social History:  The patient  reports that he has never smoked. He has never used smokeless tobacco. He reports that he drinks about 4.2 oz of alcohol per week . He reports that he does not use drugs.   Family History:  The patient's family history includes Diabetes in his mother.    ROS: All other systems are reviewed and negative. Unless otherwise mentioned in H&P    PHYSICAL EXAM: VS:  BP 130/78   Pulse 73   Ht 6\' 1"  (1.854 m)   Wt 255 lb (115.7 kg)   SpO2 95%   BMI 33.64 kg/m  , BMI Body mass index is 33.64 kg/m. GEN: Well nourished, well developed, in no acute distress  HEENT:  normal Eyes red and bloodshot. Neck: no JVD, carotid bruits, or masses Cardiac: RRR;Distant heart sounds no murmurs, rubs, or gallops,no edema  Respiratory:  clear to auscultation bilaterally, normal work of breathing GI: soft, nontender, nondistended, + BS, frequent burping MS: no deformity or atrophy  Skin: warm and dry, no rash Neuro:  Strength and sensation are intact Psych: euthymic mood, full affect   Recent Labs: 03/19/2015: ALT 43; BUN 19; Creat 1.54; Hemoglobin 13.8; Platelets 158; Potassium 4.5; Sodium 137    Lipid Panel    Component Value Date/Time   CHOL 145 04/14/2014 0427   TRIG 60 04/14/2014 0427   HDL 43 04/14/2014 0427    CHOLHDL 3.4 04/14/2014 0427   VLDL 12 04/14/2014 0427   LDLCALC 90 04/14/2014 0427      Wt Readings from Last 3 Encounters:  10/22/15 255 lb (115.7 kg)  10/01/15 261 lb (118.4 kg)  03/19/15 280 lb (127 kg)     ASSESSMENT AND PLAN:  1. Cardiomyopathy: EF of 30-35% by recent echo in February 2017. Will order repeat echo, continue carvedilol 25 mg twice a day. He is intolerant to ACE inhibitor secondary to angioedema. He will continue Demadex 20 mg daily. I will order a BMET and a CBC.  2. Hypertension: Blood pressure is moderately controlled. Repeating echocardiogram to evaluate for LV function after being on carvedilol. [facility] We'll make that decision after echo was completed.  3. History of paroxysmal atrial fibrillation/flutter: Remains on ELIQUIS 5 mg twice a day. No complaints of bleeding or excessive bruising.  4. History of whole abuse: He states he has not had any alcohol in approximately one month.     Current medicines are reviewed at length with the patient today.    Labs/ tests ordered today include:   Orders Placed This Encounter  Procedures  . Basic Metabolic Panel (BMET)  . CBC with Differential  . ECHOCARDIOGRAM COMPLETE     Disposition:   FU with  3 months  Signed, Joni ReiningKathryn Shafter Jupin, NP  10/22/2015 2:44 PM    [facility] [address], [place], [place] [address] Phone: [phone]; Fax: [phone]

## 2015-10-22 NOTE — Progress Notes (Signed)
Name: Gabriel Reynolds    DOB: March 13, 1955  Age: 60 y.o.  MR#: [id number]       PCP:  Avon GullyFANTA,TESFAYE, MD      Insurance: Payor: MEDICARE / Plan: MEDICARE PART A AND B / Product Type: *No Product type* /   CC:   No chief complaint on file.   VS Vitals:   10/22/15 1410  Pulse: 73  SpO2: 95%  Weight: 255 lb (115.7 kg)  Height: 6\' 1"  (1.854 m)    Weights Current Weight  10/22/15 255 lb (115.7 kg)  10/01/15 261 lb (118.4 kg)  03/19/15 280 lb (127 kg)    Blood Pressure  BP Readings from Last 3 Encounters:  10/01/15 122/78  03/19/15 112/80  07/31/14 120/80     Admit date:  (Not on file) Last encounter with RMR:  Visit date not found   Allergy Ace inhibitors and Other  Current Outpatient Prescriptions  Medication Sig Dispense Refill  . apixaban (ELIQUIS) 5 MG TABS tablet Take 1 tablet (5 mg total) by mouth 2 (two) times daily. 180 tablet 1  . b complex vitamins tablet Take 1 tablet by mouth daily.    . carvedilol (COREG) 25 MG tablet Take 1 tablet (25 mg total) by mouth 2 (two) times daily. 180 tablet 3  . folic acid (FOLVITE) 1 MG tablet Take 1 tablet (1 mg total) by mouth daily.    . insulin detemir (LEVEMIR) 100 UNIT/ML injection Inject 0.05 mLs (5 Units total) into the skin daily.    . potassium chloride (KLOR-CON) 20 MEQ packet Take 20 mEq by mouth daily.    Marland Kitchen. torsemide (DEMADEX) 20 MG tablet Take 1 tablet (20 mg total) by mouth daily. 30 tablet 6   No current facility-administered medications for this visit.     Discontinued Meds:   There are no discontinued medications.  Patient Active Problem List   Diagnosis Date Noted  . Foreign body (FB) in soft tissue 04/30/2014  . Atrial flutter (HCC) 04/28/2014  . AKI (acute kidney injury) (HCC) 04/28/2014  . Streptococcus viridans infection 04/28/2014  . Bacteremia due to Streptococcus 04/28/2014  . Hepatic cirrhosis (HCC) 04/28/2014  . Alcohol abuse 04/28/2014  . Angioedema 04/28/2014  . DVT (deep venous thrombosis) (HCC)    . Acute combined systolic and diastolic CHF, NYHA class 4 (HCC) 04/13/2014    LABS    Component Value Date/Time   NA 137 03/19/2015 1358   NA 138 07/31/2014 1440   NA 141 07/03/2014 0848   K 4.5 03/19/2015 1358   K 3.8 07/31/2014 1440   K 3.7 07/03/2014 0848   CL 102 03/19/2015 1358   CL 101 07/31/2014 1440   CL 103 07/03/2014 0848   CO2 27 03/19/2015 1358   CO2 28 07/31/2014 1440   CO2 28 07/03/2014 0848   GLUCOSE 216 (H) 03/19/2015 1358   GLUCOSE 269 (H) 07/31/2014 1440   GLUCOSE 141 (H) 07/03/2014 0848   BUN 19 03/19/2015 1358   BUN 26 (H) 07/31/2014 1440   BUN 18 07/03/2014 0848   CREATININE 1.54 (H) 03/19/2015 1358   CREATININE 1.44 (H) 07/31/2014 1440   CREATININE 1.31 07/03/2014 0848   CREATININE 1.14 05/25/2014 1022   CALCIUM 9.5 03/19/2015 1358   CALCIUM 8.9 07/31/2014 1440   CALCIUM 8.9 07/03/2014 0848   GFRNONAA 51 (L) 07/31/2014 1440   GFRNONAA 63 (L) 05/14/2014 0800   GFRNONAA 55 (L) 05/10/2014 1431   GFRAA 59 (L) 07/31/2014 1440   GFRAA 73 (  L) 05/14/2014 0800   GFRAA 64 (L) 05/10/2014 1431   CMP     Component Value Date/Time   NA 137 03/19/2015 1358   K 4.5 03/19/2015 1358   CL 102 03/19/2015 1358   CO2 27 03/19/2015 1358   GLUCOSE 216 (H) 03/19/2015 1358   BUN 19 03/19/2015 1358   CREATININE 1.54 (H) 03/19/2015 1358   CALCIUM 9.5 03/19/2015 1358   PROT 7.0 03/19/2015 1358   ALBUMIN 3.9 03/19/2015 1358   AST 72 (H) 03/19/2015 1358   ALT 43 03/19/2015 1358   ALKPHOS 98 03/19/2015 1358   BILITOT 1.1 03/19/2015 1358   GFRNONAA 51 (L) 07/31/2014 1440   GFRAA 59 (L) 07/31/2014 1440       Component Value Date/Time   WBC 7.1 03/19/2015 1358   WBC 6.7 07/31/2014 1440   WBC 6.6 05/07/2014 0630   HGB 13.8 03/19/2015 1358   HGB 14.0 07/31/2014 1440   HGB 9.4 (L) 05/07/2014 0630   HCT 40.8 03/19/2015 1358   HCT 40.9 07/31/2014 1440   HCT 28.9 (L) 05/07/2014 0630   MCV 89.5 03/19/2015 1358   MCV 85.4 07/31/2014 1440   MCV 97.3 05/07/2014 0630     Lipid Panel     Component Value Date/Time   CHOL 145 04/14/2014 0427   TRIG 60 04/14/2014 0427   HDL 43 04/14/2014 0427   CHOLHDL 3.4 04/14/2014 0427   VLDL 12 04/14/2014 0427   LDLCALC 90 04/14/2014 0427    ABG    Component Value Date/Time   PHART 7.461 (H) 04/22/2014 0502   PCO2ART 41.5 04/22/2014 0502   PO2ART 100.0 04/22/2014 0502   HCO3 29.2 (H) 04/22/2014 0502   TCO2 26.3 04/22/2014 0502   ACIDBASEDEF 1.1 04/18/2014 0346   O2SAT 97.6 04/22/2014 0502     Lab Results  Component Value Date   TSH 1.213 04/13/2014   BNP (last 3 results) No results for input(s): BNP in the last 8760 hours.  ProBNP (last 3 results) No results for input(s): PROBNP in the last 8760 hours.  Cardiac Panel (last 3 results) No results for input(s): CKTOTAL, CKMB, TROPONINI, RELINDX in the last 72 hours.  Iron/TIBC/Ferritin/ %Sat No results found for: IRON, TIBC, FERRITIN, IRONPCTSAT   EKG Orders placed or performed in visit on 10/01/15  . EKG 12-Lead     Prior Assessment and Plan Problem List as of 10/22/2015 Reviewed: 10/02/2015 12:19 PM by Dina Rich, MD     Cardiovascular and Mediastinum   Acute combined systolic and diastolic CHF, NYHA class 4 (HCC)   DVT (deep venous thrombosis) (HCC)   Atrial flutter (HCC)     Digestive   Hepatic cirrhosis (HCC)     Genitourinary   AKI (acute kidney injury) (HCC)     Other   Streptococcus viridans infection   Bacteremia due to Streptococcus   Alcohol abuse   Angioedema   Foreign body (FB) in soft tissue       Imaging: No results found.

## 2015-10-22 NOTE — ED Provider Notes (Addendum)
[facility]-EMERGENCY DEPT Provider Note   CSN: [id number] Arrival date & time: 10/22/15  1630     History   Chief Complaint Chief Complaint  Patient presents with  . Hyperglycemia    HPI Gabriel SeminoleRalph E Reynolds is a 60 y.o. male.  Pt presents to the ED today after going to his doctor's appt today in which blood was drawn.  The pt was called with an elevated blood sugar and told to come to the ED.  The pt has no complaints.  The pt does take Levemir, but admits to not taking it every day.  Pt thinks he took it last night.      Past Medical History:  Diagnosis Date  . Acute combined systolic and diastolic CHF, NYHA class 4 (HCC) 04/13/2014  . Alcohol abuse 04/28/2014  . Angioedema 04/28/2014  . Atrial flutter (HCC) 04/28/2014  . Bacteremia due to Streptococcus 04/28/2014  . Diabetes mellitus   . DVT (deep venous thrombosis) (HCC)   . Foreign body (FB) in soft tissue 04/30/2014   Right upper arm soft tissue, failed PICC line placement, retained guide-wire fragment.   . Hepatic cirrhosis (HCC) 04/28/2014  . Hypertension   . Streptococcus viridans infection 04/28/2014    Patient Active Problem List   Diagnosis Date Noted  . Foreign body (FB) in soft tissue 04/30/2014  . Atrial flutter (HCC) 04/28/2014  . AKI (acute kidney injury) (HCC) 04/28/2014  . Streptococcus viridans infection 04/28/2014  . Bacteremia due to Streptococcus 04/28/2014  . Hepatic cirrhosis (HCC) 04/28/2014  . Alcohol abuse 04/28/2014  . Angioedema 04/28/2014  . DVT (deep venous thrombosis) (HCC)   . Acute combined systolic and diastolic CHF, NYHA class 4 (HCC) 04/13/2014    Past Surgical History:  Procedure Laterality Date  . HAND SURGERY         Home Medications    Prior to Admission medications   Medication Sig Start Date End Date Taking? Authorizing Provider  apixaban (ELIQUIS) 5 MG TABS tablet Take 1 tablet (5 mg total) by mouth 2 (two) times daily. 09/16/15  Yes Antoine PocheJonathan F Branch, MD  b complex vitamins tablet  Take 1 tablet by mouth daily.   Yes Historical Provider, MD  carvedilol (COREG) 25 MG tablet Take 1 tablet (25 mg total) by mouth 2 (two) times daily. 03/22/15  Yes Jodelle GrossKathryn M Lawrence, NP  folic acid (FOLVITE) 1 MG tablet Take 1 tablet (1 mg total) by mouth daily. 04/30/14  Yes Standley Brookinganiel P Goodrich, MD  insulin detemir (LEVEMIR) 100 UNIT/ML injection Inject 0.05 mLs (5 Units total) into the skin daily. Patient taking differently: Inject 50 Units into the skin daily.  04/30/14  Yes Standley Brookinganiel P Goodrich, MD  potassium chloride (KLOR-CON) 20 MEQ packet Take 20 mEq by mouth daily.   Yes Historical Provider, MD  torsemide (DEMADEX) 20 MG tablet Take 1 tablet (20 mg total) by mouth daily. 03/19/15  Yes Jodelle GrossKathryn M Lawrence, NP    Family History Family History  Problem Relation Age of Onset  . Diabetes Mother     Social History Social History  Substance Use Topics  . Smoking status: Never Smoker  . Smokeless tobacco: Never Used  . Alcohol use 4.2 oz/week    7 Cans of beer per week     Allergies   Ace inhibitors and Other   Review of Systems Review of Systems  Endocrine:       Elevated blood sugar  All other systems reviewed and are negative.    Physical  Exam Updated Vital Signs BP 132/88   Pulse 65   Temp 98.6 F (37 C) (Oral)   Resp 15   Ht 6\' 1"  (1.854 m)   Wt 255 lb (115.7 kg)   SpO2 98%   BMI 33.64 kg/m   Physical Exam  Constitutional: He is oriented to person, place, and time. He appears well-developed and well-nourished.  HENT:  Head: Normocephalic and atraumatic.  Right Ear: External ear normal.  Left Ear: External ear normal.  Nose: Nose normal.  Mouth/Throat: Mucous membranes are dry.  Eyes: Conjunctivae and EOM are normal. Pupils are equal, round, and reactive to light.  Neck: Normal range of motion. Neck supple.  Cardiovascular: Normal rate, regular rhythm, normal heart sounds and intact distal pulses.   Pulmonary/Chest: Effort normal and breath sounds normal.    Abdominal: Soft. Bowel sounds are normal.  Musculoskeletal: Normal range of motion.  Neurological: He is alert and oriented to person, place, and time.  Skin: Skin is warm and dry.  Psychiatric: He has a normal mood and affect. His behavior is normal. Judgment and thought content normal.  Nursing note and vitals reviewed.    ED Treatments / Results  Labs (all labs ordered are listed, but only abnormal results are displayed) Labs Reviewed  BASIC METABOLIC PANEL - Abnormal; Notable for the following:       Result Value   Sodium 122 (*)    Chloride 88 (*)    CO2 21 (*)    Glucose, Bld 661 (*)    BUN 40 (*)    Creatinine, Ser 2.26 (*)    GFR calc non Af Amer 29 (*)    GFR calc Af Amer 34 (*)    All other components within normal limits  CBC - Abnormal; Notable for the following:    HCT 37.3 (*)    Platelets 147 (*)    All other components within normal limits  URINALYSIS, ROUTINE W REFLEX MICROSCOPIC (NOT AT [facility]) - Abnormal; Notable for the following:    Specific Gravity, Urine <1.005 (*)    Glucose, UA 500 (*)    Hgb urine dipstick TRACE (*)    All other components within normal limits  TROPONIN I - Abnormal; Notable for the following:    Troponin I 0.03 (*)    All other components within normal limits  URINE MICROSCOPIC-ADD ON - Abnormal; Notable for the following:    Squamous Epithelial / LPF 0-5 (*)    All other components within normal limits  CBG MONITORING, ED - Abnormal; Notable for the following:    Glucose-Capillary >600 (*)    All other components within normal limits  CBG MONITORING, ED - Abnormal; Notable for the following:    Glucose-Capillary 544 (*)    All other components within normal limits  CBG MONITORING, ED - Abnormal; Notable for the following:    Glucose-Capillary 479 (*)    All other components within normal limits  CBG MONITORING, ED - Abnormal; Notable for the following:    Glucose-Capillary 446 (*)    All other components within normal  limits  CBG MONITORING, ED - Abnormal; Notable for the following:    Glucose-Capillary 362 (*)    All other components within normal limits    EKG  EKG Interpretation None       Radiology No results found.  Procedures Procedures (including critical care time)  Medications Ordered in ED Medications  sodium chloride 0.9 % bolus 1,000 mL (0 mLs Intravenous Stopped 10/22/15  1833)  insulin aspart (novoLOG) injection 10 Units (10 Units Intravenous Given 10/22/15 1740)  insulin aspart (novoLOG) injection 10 Units (10 Units Intravenous Given 10/22/15 1833)  insulin aspart (novoLOG) injection 10 Units (10 Units Intravenous Given 10/22/15 1926)  insulin aspart (novoLOG) injection 20 Units (20 Units Intravenous Given 10/22/15 2028)     Initial Impression / Assessment and Plan / ED Course  I have reviewed the triage vital signs and the nursing notes.  Pertinent labs & imaging results that were available during my care of the patient were reviewed by me and considered in my medical decision making (see chart for details).  Clinical Course   The pt was given 50 units of novolog insulin and 1L NS and his BS is finally in the 300s.  I told him to take his regular meds when he gets home.  He is instructed to f/u with his PCP to repeat his BMP and Hgb A1c.  Pt knows to take his meds as directed.  Troponin likely slightly elevated due to elevation in BUN/Cr.  Pt does not have any chest pain.  Final Clinical Impressions(s) / ED Diagnoses   Final diagnoses:  Hyperglycemia  Noncompliance with medication regimen  CRI (chronic renal insufficiency), unspecified stage    New Prescriptions New Prescriptions   No medications on file     Jacalyn Lefevre, MD 10/22/15 2103    Jacalyn Lefevre, MD 10/22/15 2103

## 2015-10-22 NOTE — ED Notes (Signed)
Pt states he had blood drawn today and was told to come to ED due to blood sugar reading above 600. Denies dysuria, polyuria, blurred vision, CP, SOB, N/V/D.

## 2015-10-24 NOTE — Telephone Encounter (Signed)
Error

## 2015-10-29 ENCOUNTER — Ambulatory Visit (HOSPITAL_COMMUNITY)
Admission: RE | Admit: 2015-10-29 | Discharge: 2015-10-29 | Disposition: A | Discharge status: Home / Self Care | Payer: Medicare Other | Source: Ambulatory Visit | Attending: Adult Health | Admitting: Adult Health

## 2015-10-29 DIAGNOSIS — F1010 Alcohol abuse, uncomplicated: Secondary | ICD-10-CM | POA: Diagnosis not present

## 2015-10-29 DIAGNOSIS — I4892 Unspecified atrial flutter: Secondary | ICD-10-CM | POA: Insufficient documentation

## 2015-10-29 DIAGNOSIS — Z86718 Personal history of other venous thrombosis and embolism: Secondary | ICD-10-CM | POA: Diagnosis not present

## 2015-10-29 DIAGNOSIS — I5041 Acute combined systolic (congestive) and diastolic (congestive) heart failure: Secondary | ICD-10-CM | POA: Diagnosis not present

## 2015-10-29 NOTE — Progress Notes (Signed)
*  PRELIMINARY RESULTS* Echocardiogram 2D Echocardiogram has been performed.  Stacey DrainWhite, Buren Havey J 10/29/2015, 2:55 PM

## 2015-10-30 ENCOUNTER — Telehealth: Payer: Self-pay | Admitting: *Deleted

## 2015-10-30 NOTE — Telephone Encounter (Signed)
-----   Message from Jodelle GrossKathryn M Lawrence, NP sent at 10/29/2015  4:49 PM EDT ----- Significant improvement in LV systolic function from 30-35% to normal at 60-65%. He is doing much better on medication regimen. Continue this. No changes.

## 2015-10-30 NOTE — Telephone Encounter (Signed)
Called patient with test results. No answer. Left message to call back.  

## 2015-12-23 ENCOUNTER — Other Ambulatory Visit: Payer: Self-pay | Admitting: Adult Health

## 2015-12-31 DIAGNOSIS — K7030 Alcoholic cirrhosis of liver without ascites: Secondary | ICD-10-CM | POA: Diagnosis not present

## 2015-12-31 DIAGNOSIS — I5022 Chronic systolic (congestive) heart failure: Secondary | ICD-10-CM | POA: Diagnosis not present

## 2015-12-31 DIAGNOSIS — I4891 Unspecified atrial fibrillation: Secondary | ICD-10-CM | POA: Diagnosis not present

## 2015-12-31 DIAGNOSIS — E119 Type 2 diabetes mellitus without complications: Secondary | ICD-10-CM | POA: Diagnosis not present

## 2016-01-20 ENCOUNTER — Other Ambulatory Visit: Payer: Self-pay | Admitting: Adult Health

## 2016-01-21 ENCOUNTER — Ambulatory Visit: Payer: Medicare Other | Admitting: Cardiology

## 2016-02-11 ENCOUNTER — Other Ambulatory Visit (HOSPITAL_COMMUNITY)
Admission: RE | Admit: 2016-02-11 | Discharge: 2016-02-11 | Disposition: A | Discharge status: Home / Self Care | Payer: Medicare Other | Source: Ambulatory Visit | Attending: Cardiology | Admitting: Cardiology

## 2016-02-11 ENCOUNTER — Encounter: Payer: Self-pay | Admitting: Cardiology

## 2016-02-11 ENCOUNTER — Ambulatory Visit (INDEPENDENT_AMBULATORY_CARE_PROVIDER_SITE_OTHER): Payer: Medicare Other | Admitting: Cardiology

## 2016-02-11 VITALS — BP 126/76 | HR 81 | Ht 73.0 in | Wt 281.0 lb

## 2016-02-11 DIAGNOSIS — E785 Hyperlipidemia, unspecified: Secondary | ICD-10-CM | POA: Insufficient documentation

## 2016-02-11 DIAGNOSIS — R002 Palpitations: Secondary | ICD-10-CM | POA: Insufficient documentation

## 2016-02-11 DIAGNOSIS — I5042 Chronic combined systolic (congestive) and diastolic (congestive) heart failure: Secondary | ICD-10-CM | POA: Diagnosis not present

## 2016-02-11 DIAGNOSIS — D649 Anemia, unspecified: Secondary | ICD-10-CM | POA: Insufficient documentation

## 2016-02-11 DIAGNOSIS — Z79899 Other long term (current) drug therapy: Secondary | ICD-10-CM | POA: Diagnosis not present

## 2016-02-11 LAB — COMPREHENSIVE METABOLIC PANEL
ALT: 19 U/L (ref 17–63)
AST: 26 U/L (ref 15–41)
Albumin: 3.9 g/dL (ref 3.5–5.0)
Alkaline Phosphatase: 78 U/L (ref 38–126)
Anion gap: 4 — ABNORMAL LOW (ref 5–15)
BILIRUBIN TOTAL: 0.9 mg/dL (ref 0.3–1.2)
BUN: 21 mg/dL — ABNORMAL HIGH (ref 6–20)
CO2: 30 mmol/L (ref 22–32)
CREATININE: 1.49 mg/dL — AB (ref 0.61–1.24)
Calcium: 9 mg/dL (ref 8.9–10.3)
Chloride: 104 mmol/L (ref 101–111)
GFR, EST AFRICAN AMERICAN: 56 mL/min — AB (ref >60)
GFR, EST NON AFRICAN AMERICAN: 49 mL/min — AB (ref >60)
Glucose, Bld: 103 mg/dL — ABNORMAL HIGH (ref 65–99)
Potassium: 3.7 mmol/L (ref 3.5–5.1)
Sodium: 138 mmol/L (ref 135–145)
TOTAL PROTEIN: 7.5 g/dL (ref 6.5–8.1)

## 2016-02-11 LAB — CBC WITH DIFFERENTIAL/PLATELET
Basophils Absolute: 0 10*3/uL (ref 0.0–0.1)
Basophils Relative: 0 %
EOS PCT: 4 %
Eosinophils Absolute: 0.3 10*3/uL (ref 0.0–0.7)
HEMATOCRIT: 39.3 % (ref 39.0–52.0)
Hemoglobin: 13.5 g/dL (ref 13.0–17.0)
LYMPHS ABS: 2.3 10*3/uL (ref 0.7–4.0)
LYMPHS PCT: 35 %
MCH: 30.3 pg (ref 26.0–34.0)
MCHC: 34.4 g/dL (ref 30.0–36.0)
MCV: 88.3 fL (ref 78.0–100.0)
MONO ABS: 0.5 10*3/uL (ref 0.1–1.0)
Monocytes Relative: 7 %
Neutro Abs: 3.7 10*3/uL (ref 1.7–7.7)
Neutrophils Relative %: 54 %
PLATELETS: 125 10*3/uL — AB (ref 150–400)
RBC: 4.45 MIL/uL (ref 4.22–5.81)
RDW: 13.1 % (ref 11.5–15.5)
WBC: 6.8 10*3/uL (ref 4.0–10.5)

## 2016-02-11 LAB — LIPID PANEL
CHOL/HDL RATIO: 3.3 ratio
CHOLESTEROL: 194 mg/dL (ref 0–200)
HDL: 59 mg/dL (ref >40)
LDL Cholesterol: 112 mg/dL — ABNORMAL HIGH (ref 0–99)
Triglycerides: 113 mg/dL (ref <150)
VLDL: 23 mg/dL (ref 0–40)

## 2016-02-11 LAB — MAGNESIUM: MAGNESIUM: 1.6 mg/dL — AB (ref 1.7–2.4)

## 2016-02-11 LAB — TSH: TSH: 1.986 u[IU]/mL (ref 0.350–4.500)

## 2016-02-11 NOTE — Patient Instructions (Signed)
Medication Instructions:  Your physician recommends that you continue on your current medications as directed. Please refer to the Current Medication list given to you today.   Labwork: ASAP   Testing/Procedures: NONE  Follow-Up: Your physician wants you to follow-up in: 6 MONTHS.  You will receive a reminder letter in the mail two months in advance. If you don't receive a letter, please call our office to schedule the follow-up appointment.   Any Other Special Instructions Will Be Listed Below (If Applicable).     If you need a refill on your cardiac medications before your next appointment, please call your pharmacy.   

## 2016-02-11 NOTE — Progress Notes (Signed)
Clinical Summary Gabriel Reynolds is a 61 y.o.male seen today for follow up of the following medical problems.   1. Chronic combined systolic/diastolic HF - 02/2015 echo LVEF 30-35%, grade II diastoilc dysfunction. Mildly dilated aortic root.  - he did not have an ischemic evaluation. Question of potentially EtOH or tachycardia mediated cardiomyopathy.  - history of angioedema on ACE-I - repeat echo 10/2015 LVEF has normalized at 60-65%.  - Denies any SOB. No recent LE edema. Signifcant weight gain but reports poor diet - compliant with meds  2. Aflutter - denies any palpitations since last visit - no bleeding on apixiban    Past Medical History:  Diagnosis Date  . Acute combined systolic and diastolic CHF, NYHA class 4 (HCC) 04/13/2014  . Alcohol abuse 04/28/2014  . Angioedema 04/28/2014  . Atrial flutter (HCC) 04/28/2014  . Bacteremia due to Streptococcus 04/28/2014  . Diabetes mellitus   . DVT (deep venous thrombosis) (HCC)   . Foreign body (FB) in soft tissue 04/30/2014   Right upper arm soft tissue, failed PICC line placement, retained guide-wire fragment.   . Hepatic cirrhosis (HCC) 04/28/2014  . Hypertension   . Streptococcus viridans infection 04/28/2014     Allergies  Allergen Reactions  . Ace Inhibitors Swelling  . Other Hives    wool     Current Outpatient Prescriptions  Medication Sig Dispense Refill  . apixaban (ELIQUIS) 5 MG TABS tablet Take 1 tablet (5 mg total) by mouth 2 (two) times daily. 180 tablet 1  . b complex vitamins tablet Take 1 tablet by mouth daily.    . carvedilol (COREG) 25 MG tablet Take 1 tablet (25 mg total) by mouth 2 (two) times daily. 180 tablet 3  . folic acid (FOLVITE) 1 MG tablet Take 1 tablet (1 mg total) by mouth daily.    . insulin detemir (LEVEMIR) 100 UNIT/ML injection Inject 0.05 mLs (5 Units total) into the skin daily. (Patient taking differently: Inject 50 Units into the skin daily. )    . potassium chloride (KLOR-CON) 20 MEQ packet  Take 20 mEq by mouth daily.    Marland Kitchen torsemide (DEMADEX) 20 MG tablet TAKE 1 TABLET(20 MG) BY MOUTH DAILY 30 tablet 6   No current facility-administered medications for this visit.      Past Surgical History:  Procedure Laterality Date  . HAND SURGERY       Allergies  Allergen Reactions  . Ace Inhibitors Swelling  . Other Hives    wool      Family History  Problem Relation Age of Onset  . Diabetes Mother      Social History Gabriel Reynolds reports that he has never smoked. He has never used smokeless tobacco. Gabriel Reynolds reports that he drinks about 4.2 oz of alcohol per week .   Review of Systems CONSTITUTIONAL: No weight loss, fever, chills, weakness or fatigue.  HEENT: Eyes: No visual loss, blurred vision, double vision or yellow sclerae.No hearing loss, sneezing, congestion, runny nose or sore throat.  SKIN: No rash or itching.  CARDIOVASCULAR: per HPI RESPIRATORY: No shortness of breath, cough or sputum.  GASTROINTESTINAL: No anorexia, nausea, vomiting or diarrhea. No abdominal pain or blood.  GENITOURINARY: No burning on urination, no polyuria NEUROLOGICAL: No headache, dizziness, syncope, paralysis, ataxia, numbness or tingling in the extremities. No change in bowel or bladder control.  MUSCULOSKELETAL: No muscle, back pain, joint pain or stiffness.  LYMPHATICS: No enlarged nodes. No history of splenectomy.  PSYCHIATRIC: No history  of depression or anxiety.  ENDOCRINOLOGIC: No reports of sweating, cold or heat intolerance. No polyuria or polydipsia.  Marland Kitchen.   Physical Examination Vitals:   02/11/16 1413  BP: 126/76  Pulse: 81   Vitals:   02/11/16 1413  Weight: 281 lb (127.5 kg)  Height: 6\' 1"  (1.854 m)    Gen: resting comfortably, no acute distress HEENT: no scleral icterus, pupils equal round and reactive, no palptable cervical adenopathy,  CV: RRR, no m/r/g, no jvd Resp: Clear to auscultation bilaterally GI: abdomen is soft, non-tender, non-distended, normal  bowel sounds, no hepatosplenomegaly MSK: extremities are warm, no edema.  Skin: warm, no rash Neuro:  no focal deficits Psych: appropriate affect   Diagnostic Studies 02/2015 echo Study Conclusions  - Left ventricle: The cavity size was normal. Wall thickness was increased in a pattern of moderate LVH. Systolic function was moderately to severely reduced. The estimated ejection fraction was in the range of 30% to 35%. Diffuse hypokinesis. Features are consistent with a pseudonormal left ventricular filling pattern, with concomitant abnormal relaxation and increased filling pressure (grade 2 diastolic dysfunction). Doppler parameters are consistent with high ventricular filling pressure. - Aorta: Mild aortic root dilatation. Aortic root dimension: 43 mm (ED). - Left atrium: The atrium was mildly dilated.  10/2015 echo Study Conclusions  - Left ventricle: The cavity size was normal. Wall thickness was   increased in a pattern of moderate LVH. Systolic function was   normal. The estimated ejection fraction was in the range of 55%   to 60%. Wall motion was normal; there were no regional wall   motion abnormalities. Doppler parameters are consistent with   abnormal left ventricular relaxation (grade 1 diastolic   dysfunction). - Aortic valve: Valve area (VTI): 2.56 cm^2. Valve area (Vmax):   2.46 cm^2. Valve area (Vmean): 2.05 cm^2. - Left atrium: The atrium was moderately dilated. - Atrial septum: No defect or patent foramen ovale was identified. - Systemic veins: IVC is small, suggesting low RA pressure and   hypovolemia. - Technically adequate study.  Assessment and Plan  1. Chronic combined systolic/diastolic  HF - 02/2015 echo LVEF 30-35%, NYHA II-III. He has not had an ischemic evaluation, initally suspected possible EtOH or tachycardia medicated CM. Recent echo shows LVEF has normalized.  - no recent symptoms, we will continue current meds  2.  Aflutter - no current symptoms - CHADS2Vasc score is 3, continue eliquis    F/u 6 months. Order annual labs      Antoine PocheJonathan F. Chaunce Winkels, M.D

## 2016-02-12 LAB — HEMOGLOBIN A1C
Hgb A1c MFr Bld: 8.6 % — ABNORMAL HIGH (ref 4.8–5.6)
MEAN PLASMA GLUCOSE: 200 mg/dL

## 2016-04-28 DIAGNOSIS — E1165 Type 2 diabetes mellitus with hyperglycemia: Secondary | ICD-10-CM | POA: Diagnosis not present

## 2016-04-28 DIAGNOSIS — I10 Essential (primary) hypertension: Secondary | ICD-10-CM | POA: Diagnosis not present

## 2016-04-28 DIAGNOSIS — I5022 Chronic systolic (congestive) heart failure: Secondary | ICD-10-CM | POA: Diagnosis not present

## 2016-05-28 ENCOUNTER — Other Ambulatory Visit: Payer: Self-pay | Admitting: Cardiology

## 2016-06-05 ENCOUNTER — Other Ambulatory Visit: Payer: Self-pay | Admitting: Adult Health

## 2016-08-04 DIAGNOSIS — I10 Essential (primary) hypertension: Secondary | ICD-10-CM | POA: Diagnosis not present

## 2016-08-04 DIAGNOSIS — K7030 Alcoholic cirrhosis of liver without ascites: Secondary | ICD-10-CM | POA: Diagnosis not present

## 2016-08-04 DIAGNOSIS — E119 Type 2 diabetes mellitus without complications: Secondary | ICD-10-CM | POA: Diagnosis not present

## 2016-08-04 DIAGNOSIS — I5022 Chronic systolic (congestive) heart failure: Secondary | ICD-10-CM | POA: Diagnosis not present

## 2016-08-07 DIAGNOSIS — E119 Type 2 diabetes mellitus without complications: Secondary | ICD-10-CM | POA: Diagnosis not present

## 2016-08-07 DIAGNOSIS — I10 Essential (primary) hypertension: Secondary | ICD-10-CM | POA: Diagnosis not present

## 2016-08-07 DIAGNOSIS — I5022 Chronic systolic (congestive) heart failure: Secondary | ICD-10-CM | POA: Diagnosis not present

## 2016-08-07 DIAGNOSIS — K7030 Alcoholic cirrhosis of liver without ascites: Secondary | ICD-10-CM | POA: Diagnosis not present

## 2016-08-13 ENCOUNTER — Other Ambulatory Visit: Payer: Self-pay | Admitting: Adult Health

## 2016-08-13 MED ORDER — TORSEMIDE 20 MG PO TABS: ORAL_TABLET | ORAL | 2 refills | Status: DC

## 2016-09-03 ENCOUNTER — Other Ambulatory Visit: Payer: Self-pay | Admitting: Adult Health

## 2016-10-02 IMAGING — US US EXTREM LOW VENOUS BILAT
1 series · 12 of 24 positions shown · non-contrast
Comparison: None.

CLINICAL DATA: Bilateral leg and foot swelling for 2 weeks.
Evaluate for DVT. The patient is currently on the ventilator.



[Series 1: us extrem low venous bilat · 0.10mm/px · 12 of 111 slices shown]
[im 5/111]
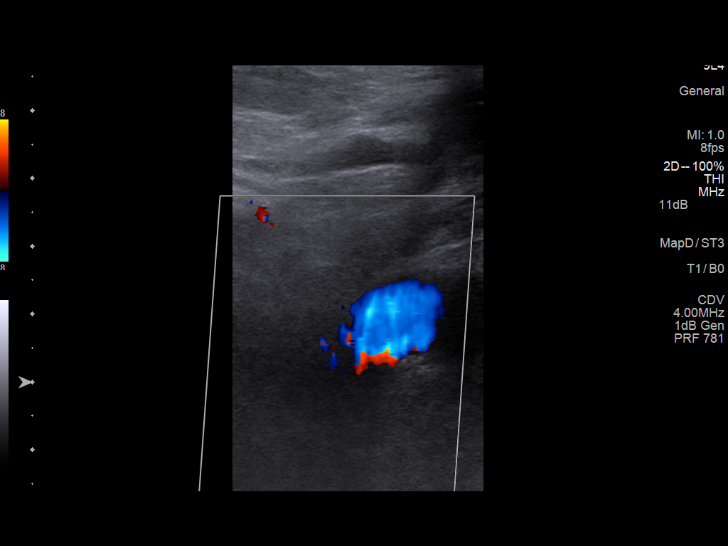
[im 15/111]
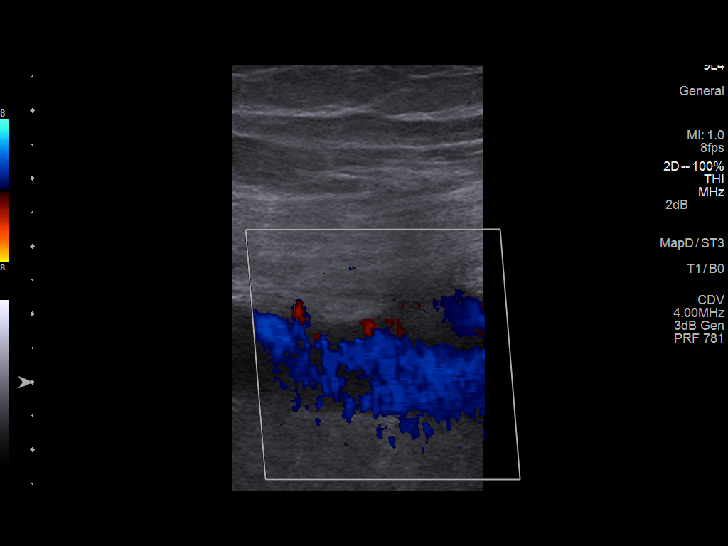
[im 24/111]
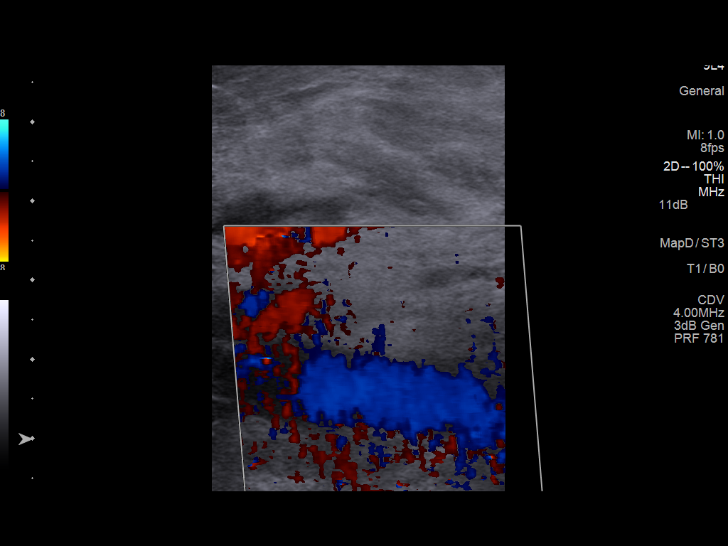
[im 34/111]
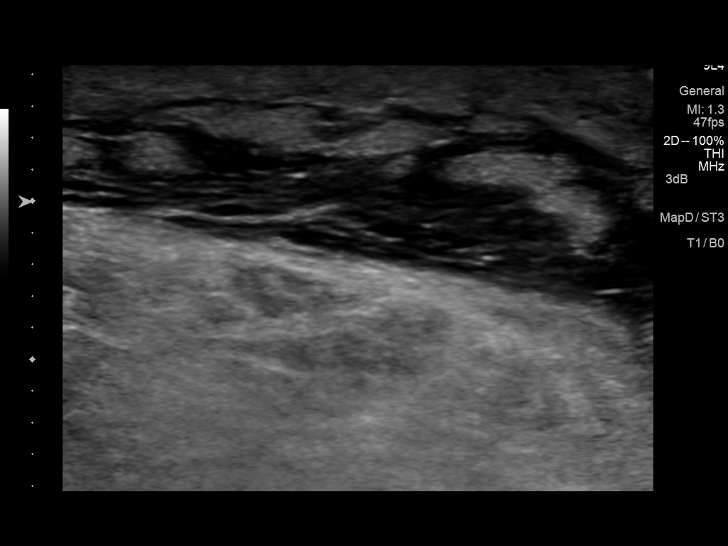
[im 44/111]
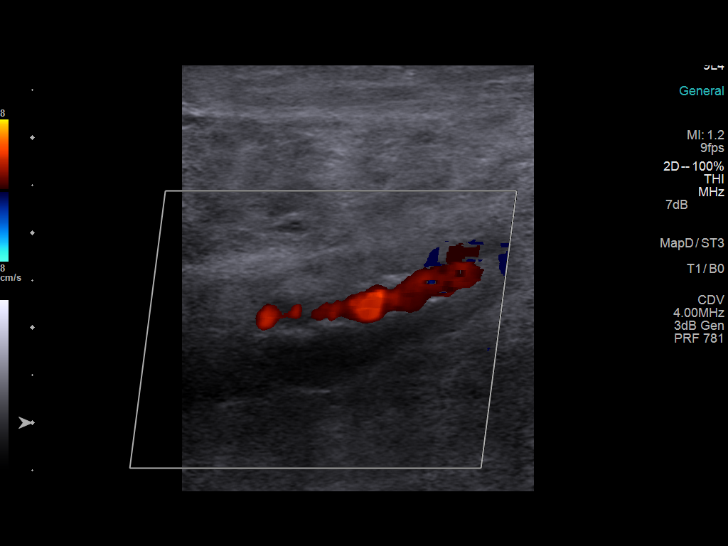
[im 53/111]
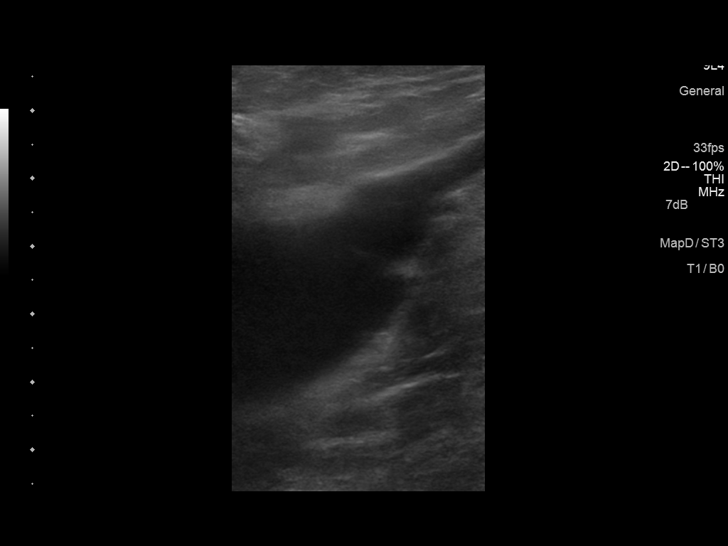
[im 63/111]
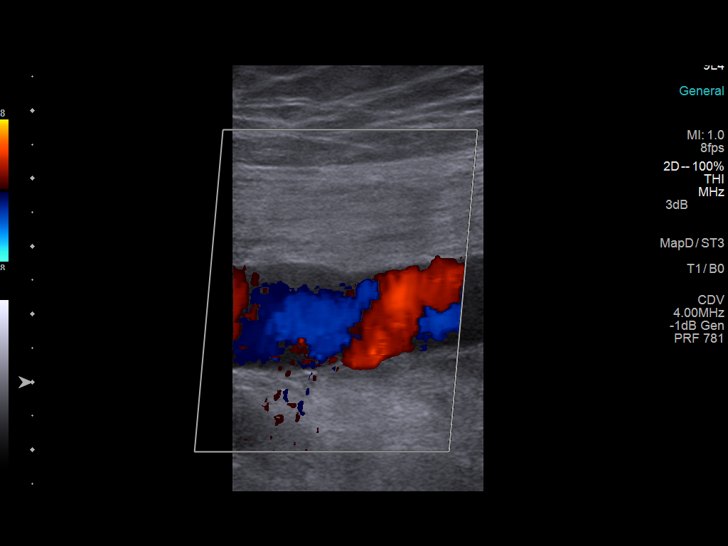
[im 72/111]
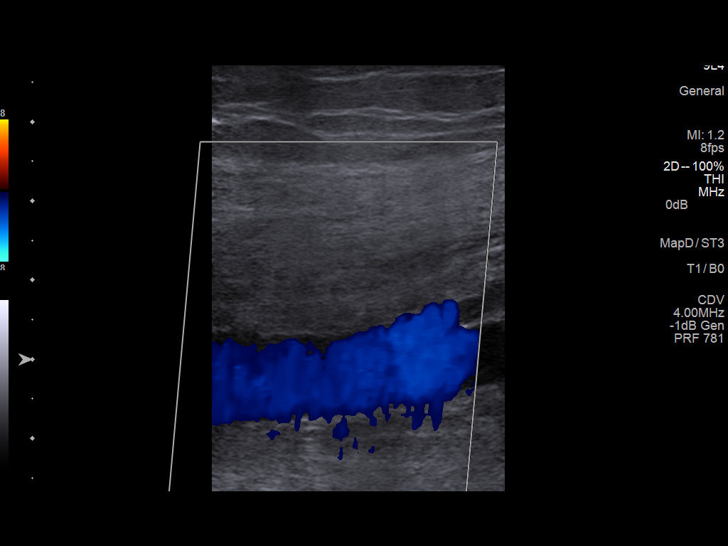
[im 82/111]
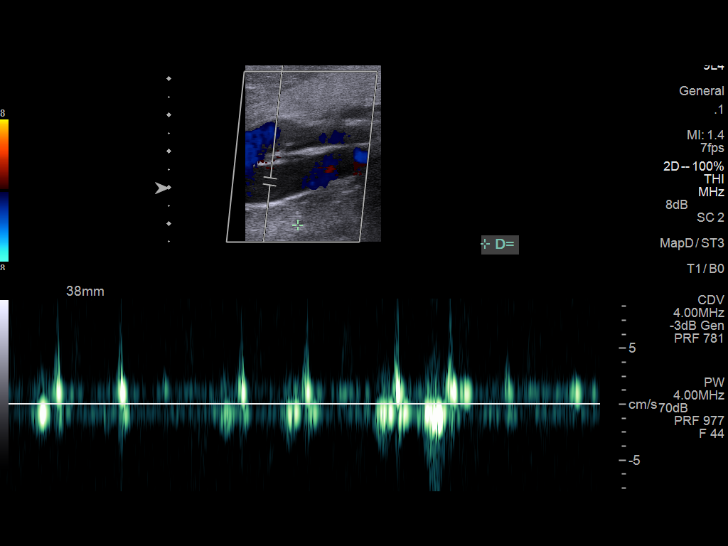
[im 91/111]
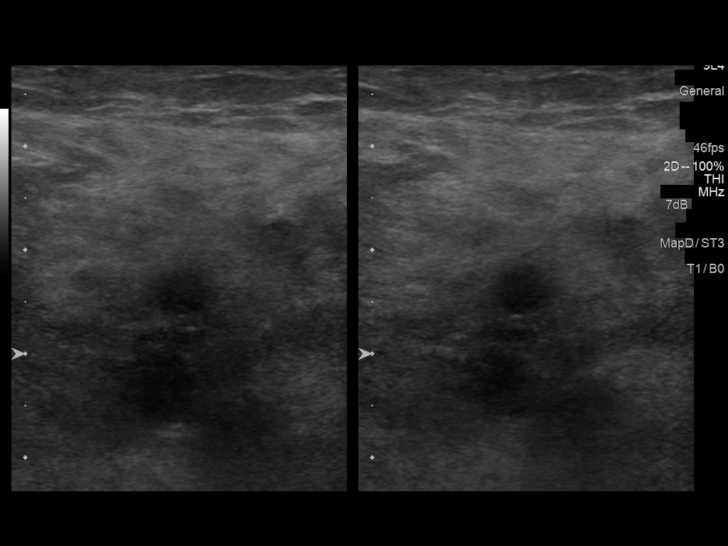
[im 101/111]
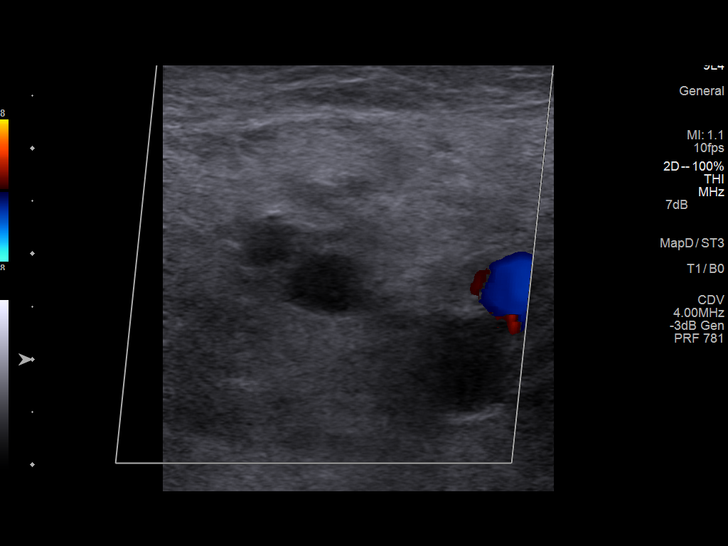
[im 111/111]
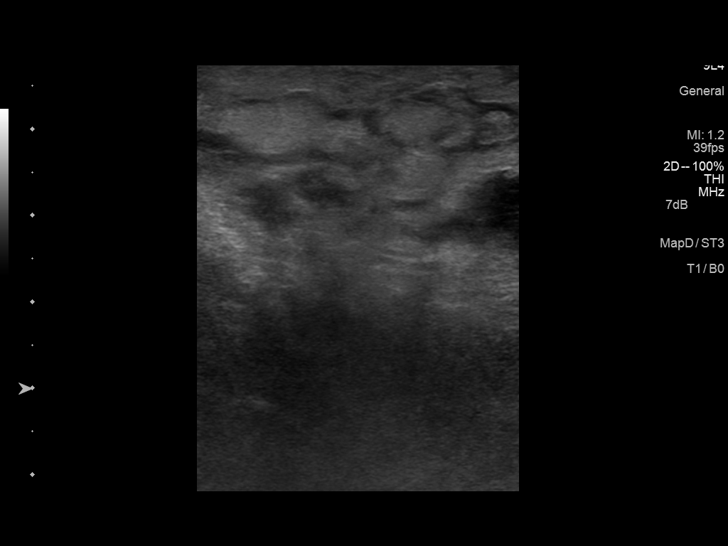

[12 of 24 positions shown; findings below may reference images not displayed]

FINDINGS: RIGHT LOWER EXTREMITY

Common Femoral Vein: No evidence of thrombus. Normal
compressibility.

Saphenofemoral Junction: No evidence of thrombus. Normal
compressibility.

Profunda Femoral Vein: No evidence of thrombus. Normal
compressibility.

Femoral Vein: No evidence of thrombus.  Normal compressibility.

Popliteal Vein: No evidence of thrombus.  Normal compressibility.

Calf Veins: There is hypoechoic expansile occlusive thrombus within
the posterior tibial vein (representative images 38 through 42).

Superficial Great Saphenous Vein: No evidence of thrombus. Normal
compressibility and flow on color Doppler imaging.

Venous Reflux:  None.

Other Findings: A minimal on subcutaneous edema is noted at the
level of the calf and lower leg.

LEFT LOWER EXTREMITY

Common Femoral Vein: No evidence of thrombus. Normal
compressibility, respiratory phasicity and response to augmentation.

Saphenofemoral Junction: No evidence of thrombus. Normal
compressibility.

Profunda Femoral Vein: No evidence of thrombus. Normal
compressibility.

Femoral Vein: No evidence of thrombus.  Normal compressibility.

Popliteal Vein: No evidence of thrombus. Slow flow is demonstrated
within the left popliteal vein (image 81) however the vessel remains
normally compressible at this location (representative image 84).

Calf Veins: There is hypoechoic nonocclusive thrombus within 1 of
the paired left posterior tibial veins (image 99 through 101).

Superficial Great Saphenous Vein: No evidence of thrombus. Normal
compressibility and flow on color Doppler imaging.

Venous Reflux:  None.

Other Findings: There is age-indeterminate occlusive superficial
thrombophlebitis involving the lesser saphenous vein (Images 105
through portal 7). A minimal amount of subcutaneous edema is noted
at the level of the left calf and ankle.
IMPRESSION: 1. Examination positive for DVT involving the right posterior tibial
vein and one of the paired left posterior tibial veins, however
there is no extension of this distal DVT to involve the more
proximal deep venous systems of either lower extremity.
2. Examination is positive for age-indeterminate occlusive
superficial thrombophlebitis involving the left lesser saphenous
vein.

## 2016-10-05 IMAGING — CR DG CHEST 1V PORT
1 series · 2 of 2 positions shown · non-contrast
Comparison: 04/18/2014 at 4009 hour

CLINICAL DATA: Difficult intubation, endotracheal tube placement

EXAM:
PORTABLE CHEST - 1 VIEW

[Series 1: portable · 0.17mm/px · 2 of 2 slices shown]
[im 1/2]
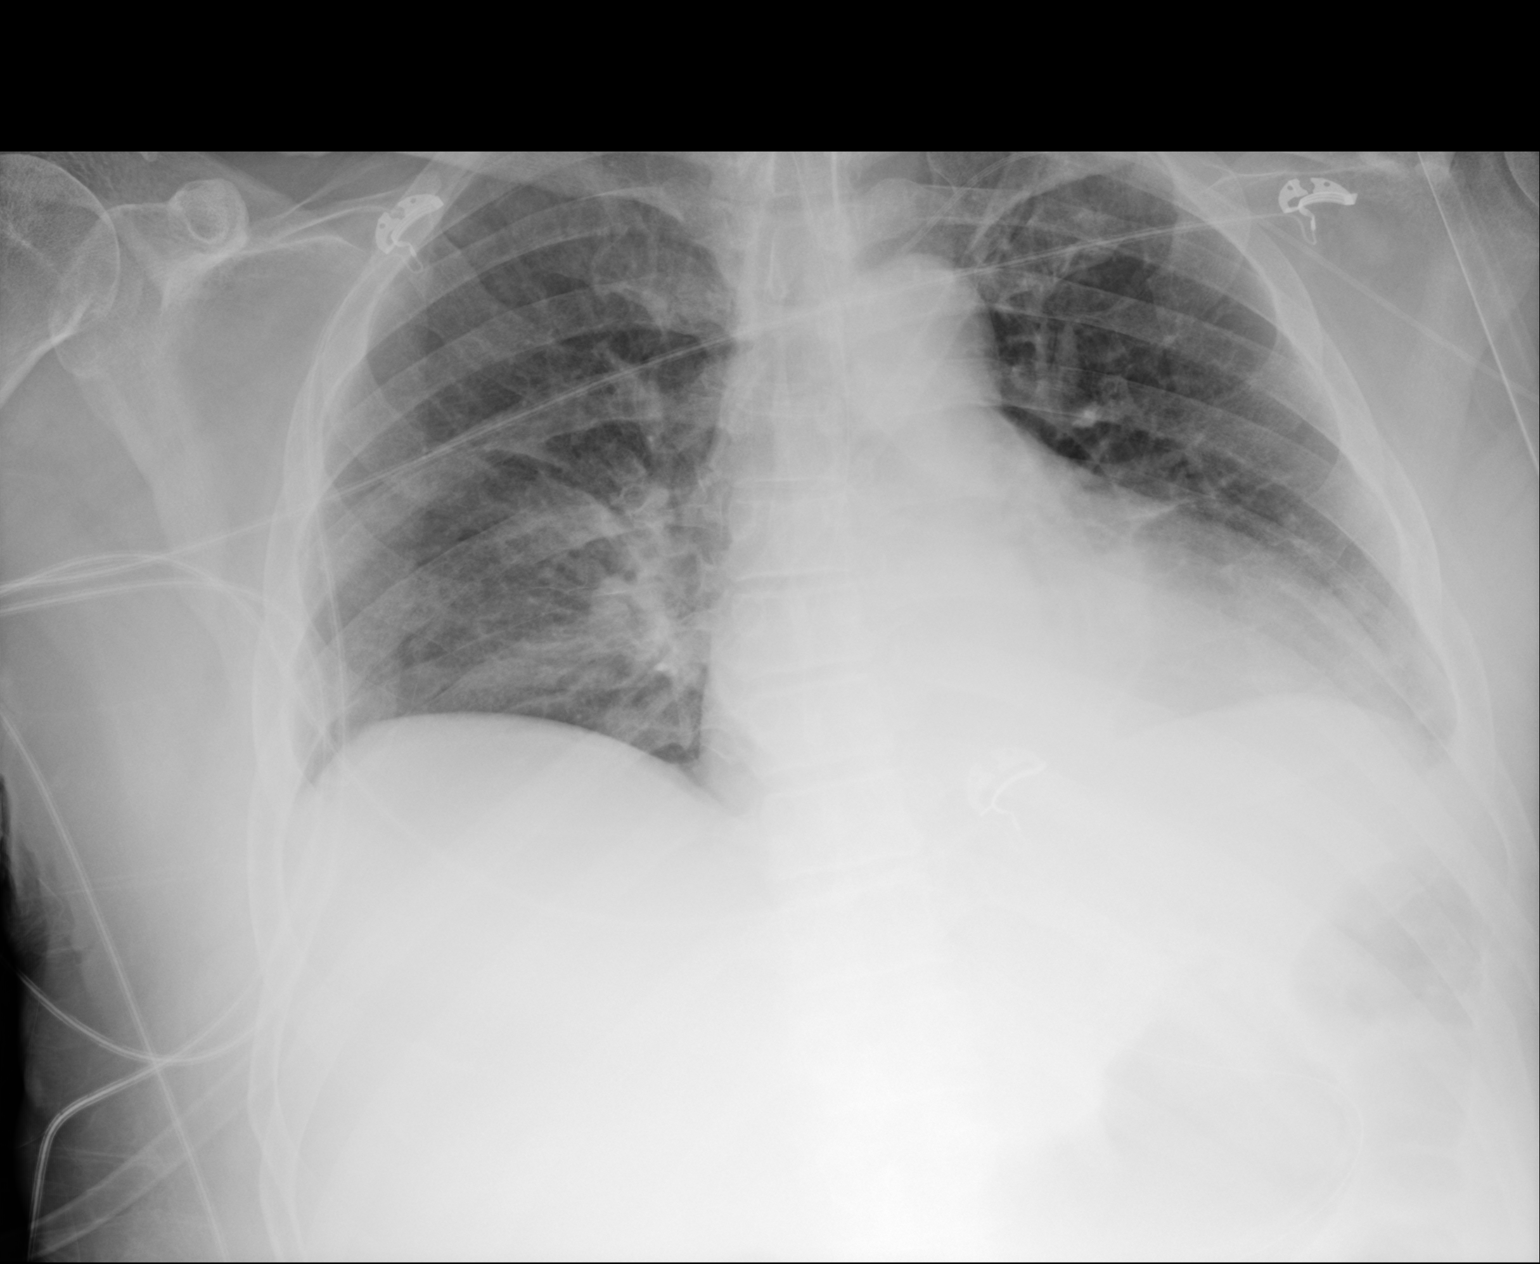
[im 2/2]
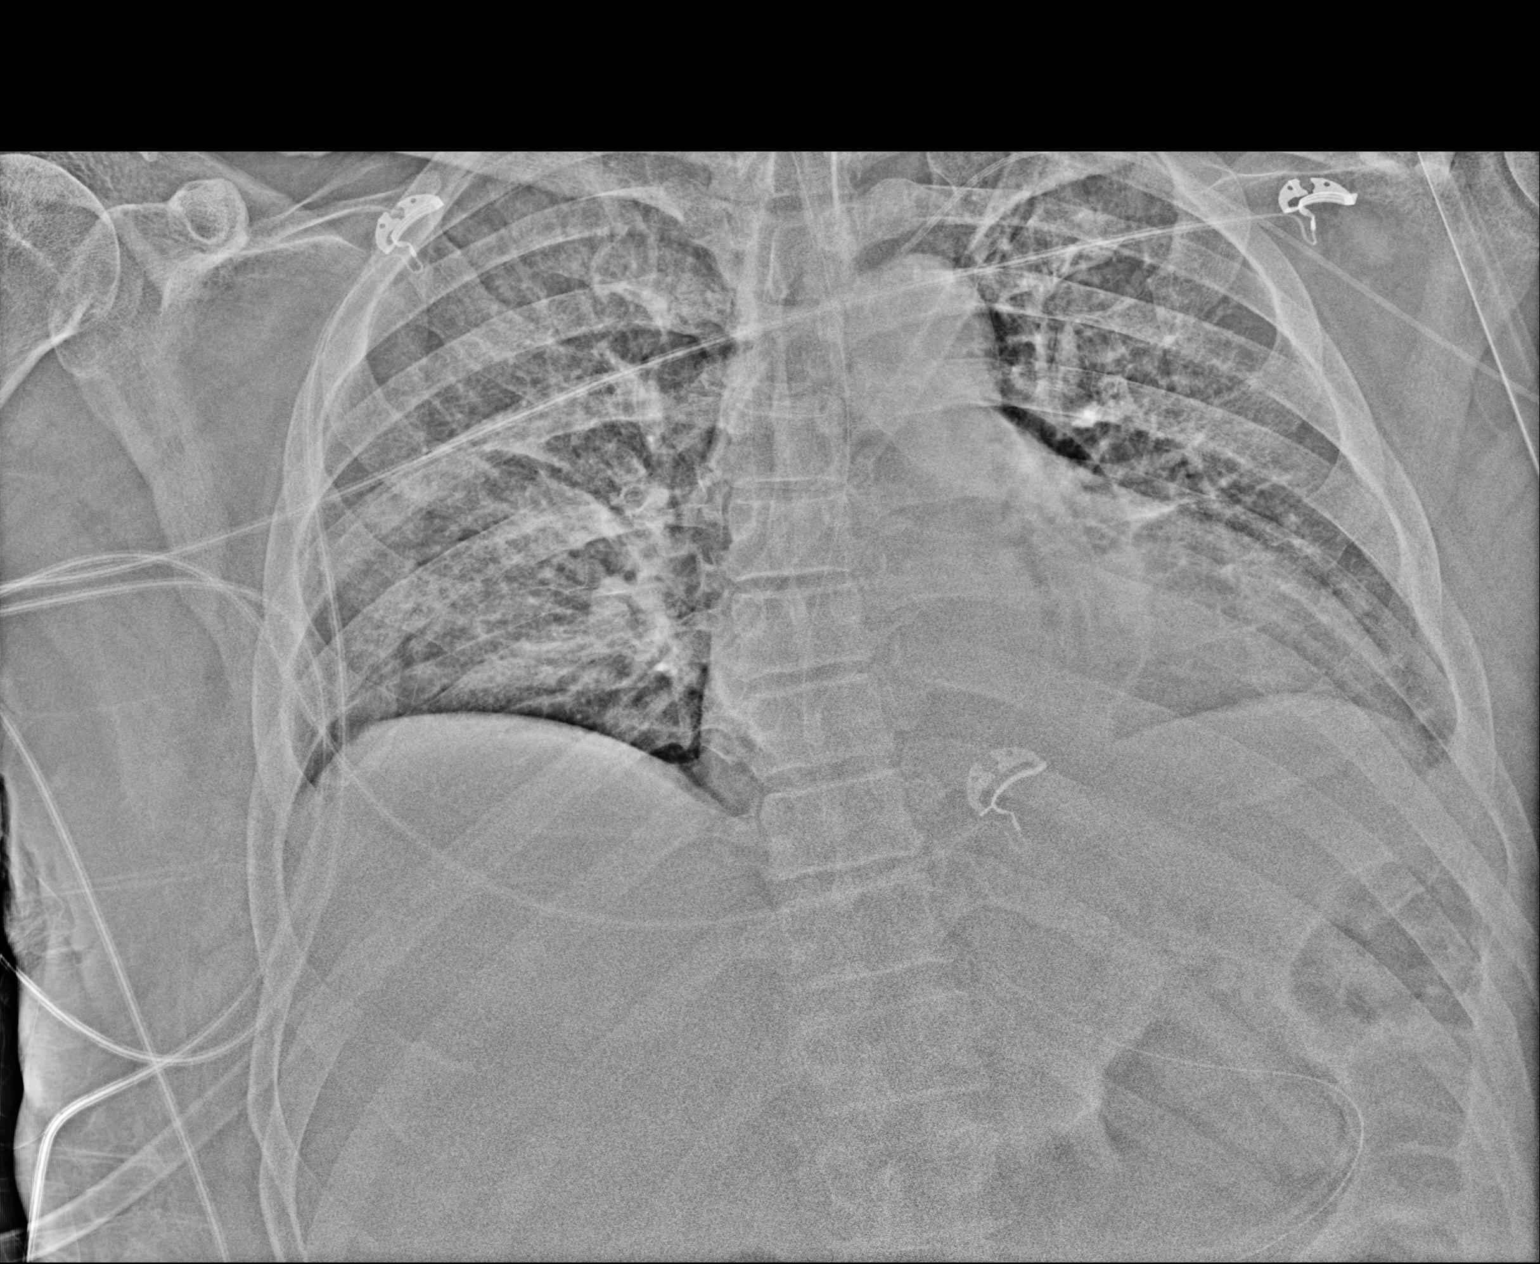

[2 of 2 positions shown; findings below may reference images not displayed]

FINDINGS: The endotracheal tube has been advanced and is now 8 cm from the
carina. Enteric tube and left upper extremity PICC remain in place.
Cardiomegaly, mild vascular congestion, and bibasilar atelectasis
are unchanged.
IMPRESSION: Endotracheal tube has been advanced and is now 8 cm from the carina.

## 2016-10-08 IMAGING — CR DG CHEST 1V PORT
1 series · 1 of 1 positions shown · non-contrast
Comparison: 04/19/2014

CLINICAL DATA: Shortness of breath, extubated

EXAM:
PORTABLE CHEST - 1 VIEW

[portable]
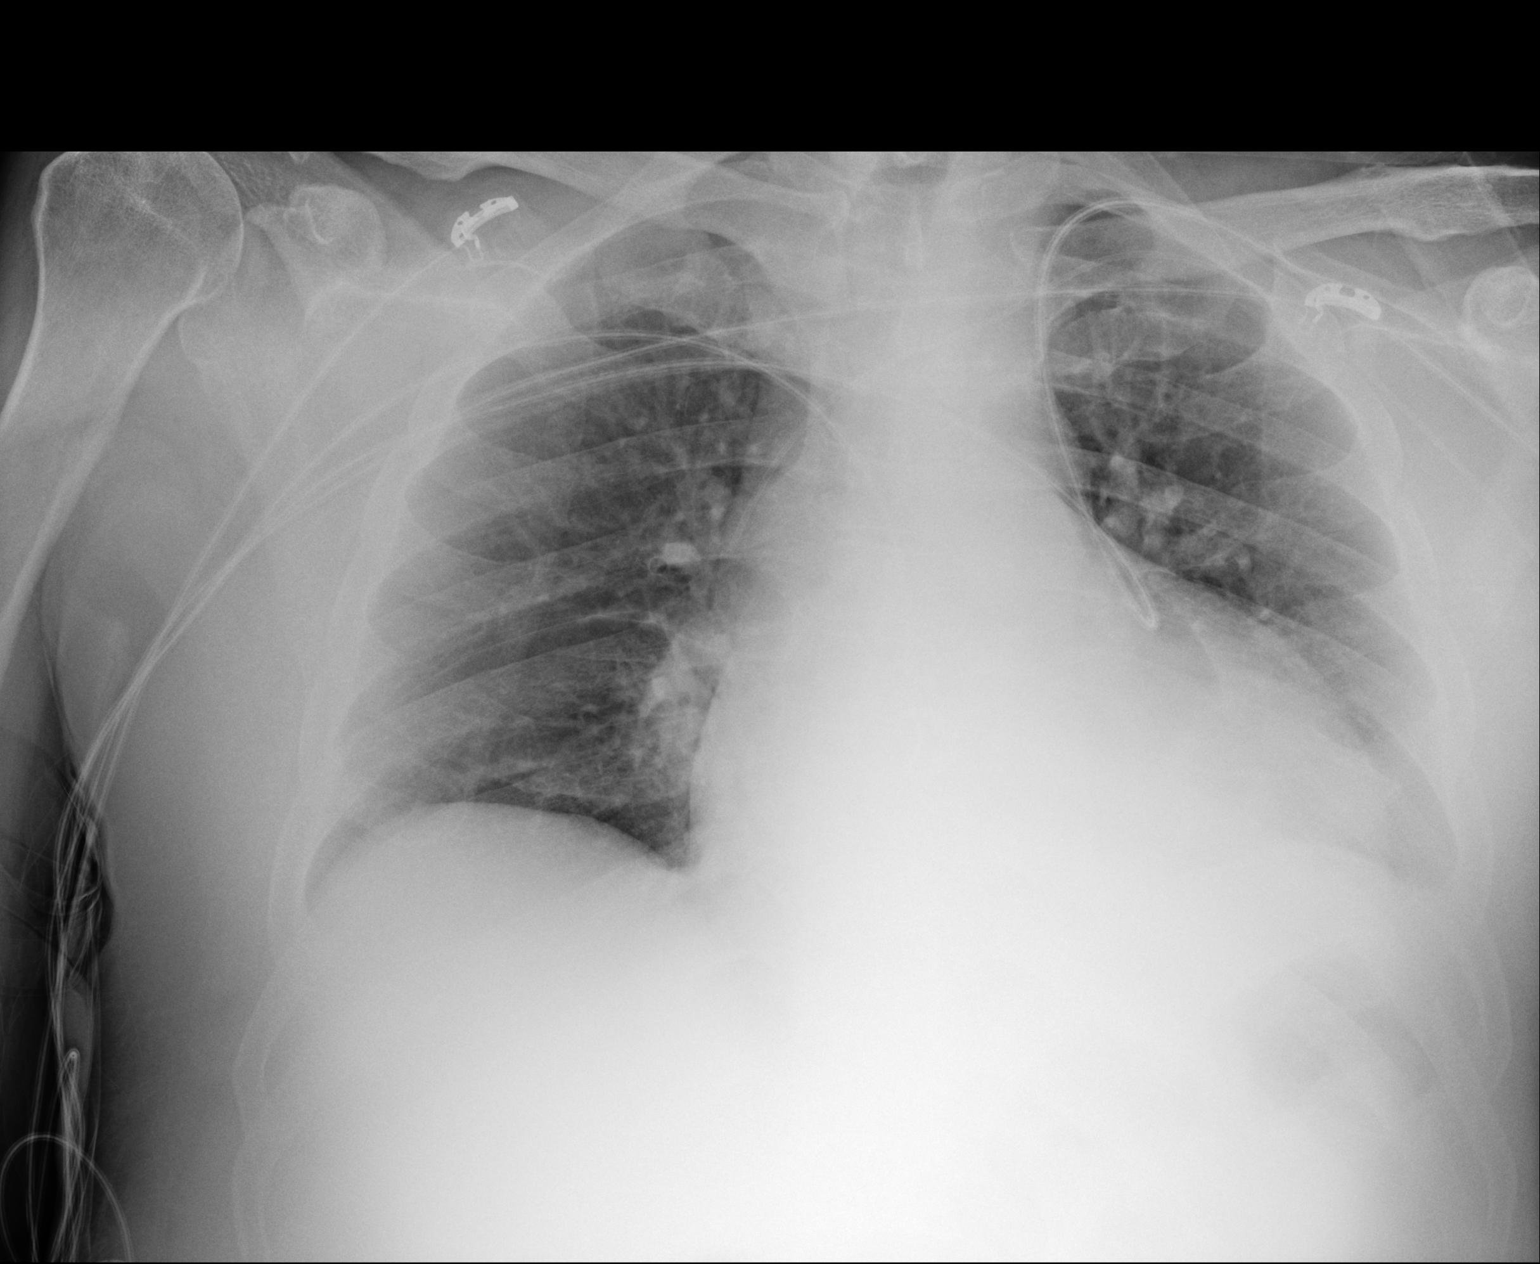

[1 of 1 positions shown; findings below may reference images not displayed]

FINDINGS: Endotracheal tube has been removed. Mild enlargement of the cardiac
silhouette is noted without evidence for edema. Trace bilateral
pleural effusions are noted with persistent retrocardiac opacity.
Right lung is grossly clear.
IMPRESSION: Persistent hypoaeration with trace bilateral pleural effusions and
retrocardiac opacity.

## 2016-10-12 IMAGING — CR DG CHEST 1V PORT
1 series · 1 of 1 positions shown · non-contrast
Comparison: 04/22/2014

CLINICAL DATA: Shortness of breath and edema

EXAM:
PORTABLE CHEST - 1 VIEW

[portable]
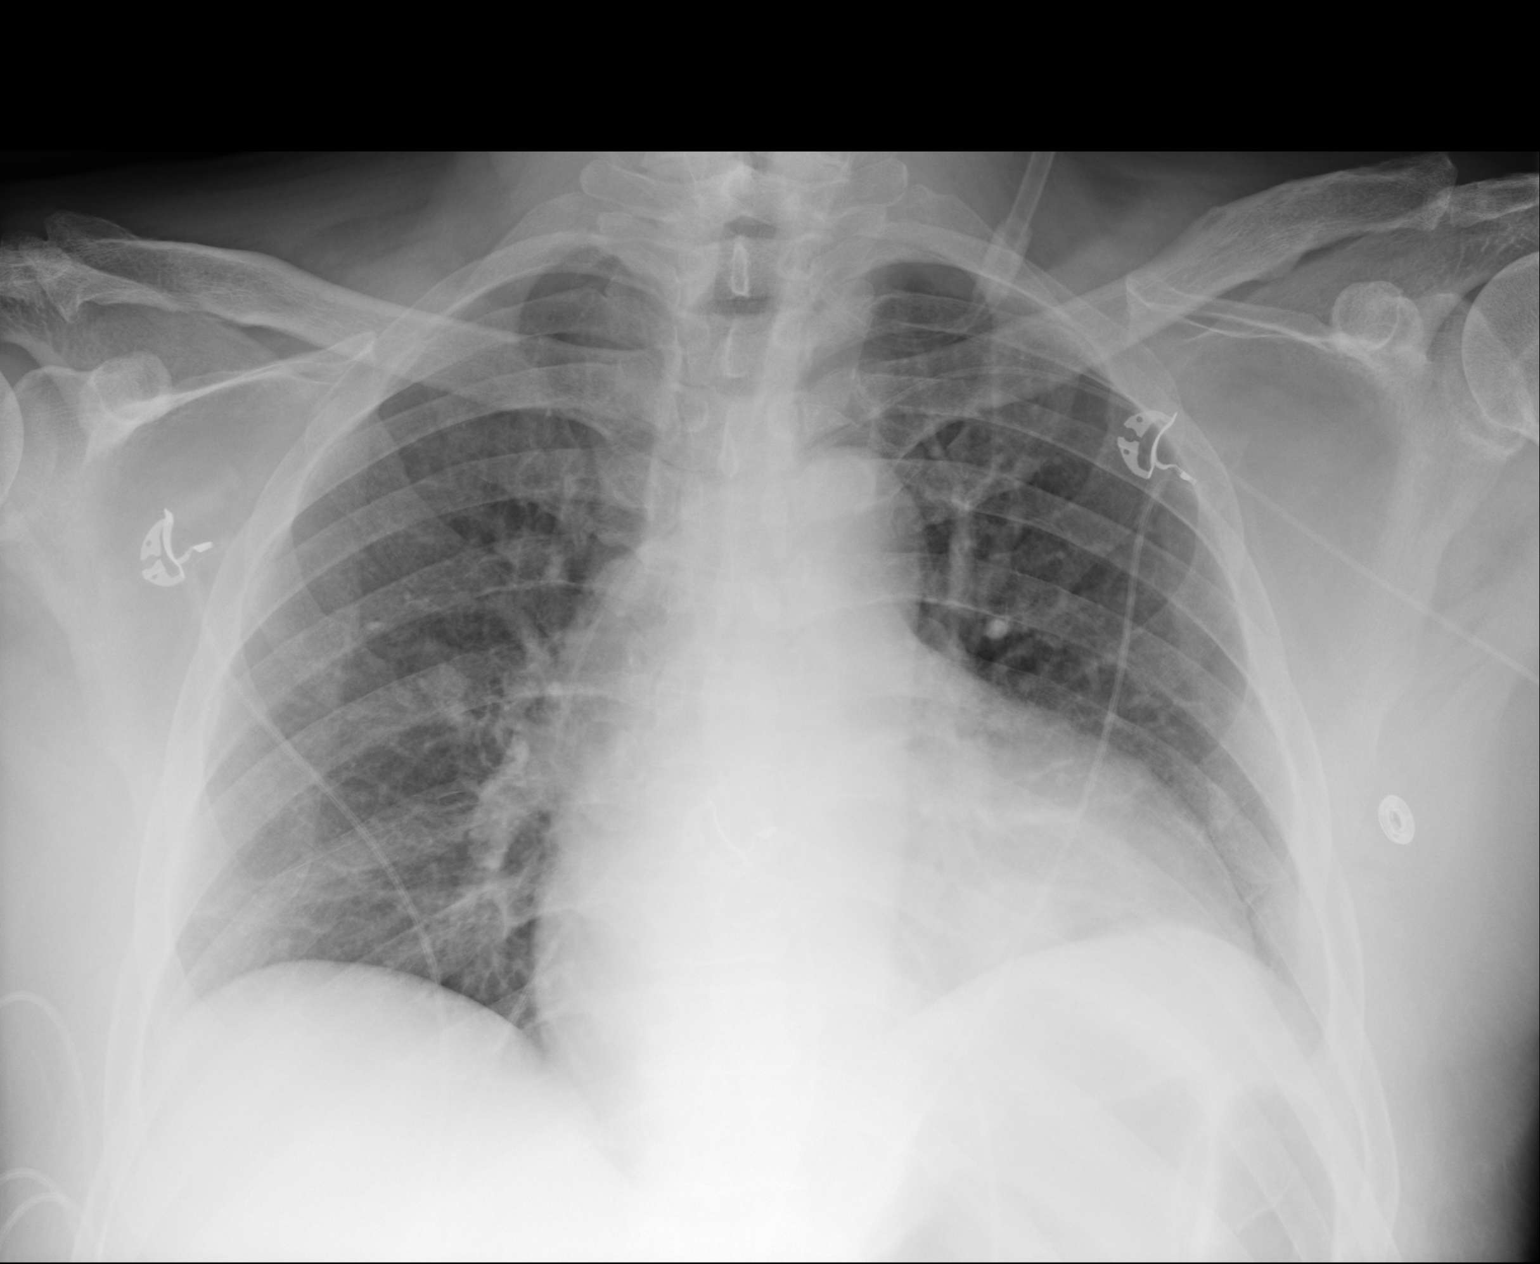

[1 of 1 positions shown; findings below may reference images not displayed]

FINDINGS: Cardiac shadow is mildly enlarged but stable. A left PICC line is
again seen in the mid superior vena cava. No focal infiltrate or
sizable effusion is seen. No bony abnormality is noted.
IMPRESSION: No acute abnormality seen.

## 2016-10-16 ENCOUNTER — Other Ambulatory Visit: Payer: Self-pay

## 2016-10-16 IMAGING — CR DG HUMERUS 2V *R*
1 series · 2 of 2 positions shown · non-contrast
Comparison: None.

CLINICAL DATA: Right arm bruising and pain. Recent attempted PICC
line placement with broken guidewire and possible foreign body.

EXAM:
RIGHT HUMERUS - 2+ VIEW

[Series 2: ap · 0.17mm/px · 2 of 2 slices shown]
[im 1/2]
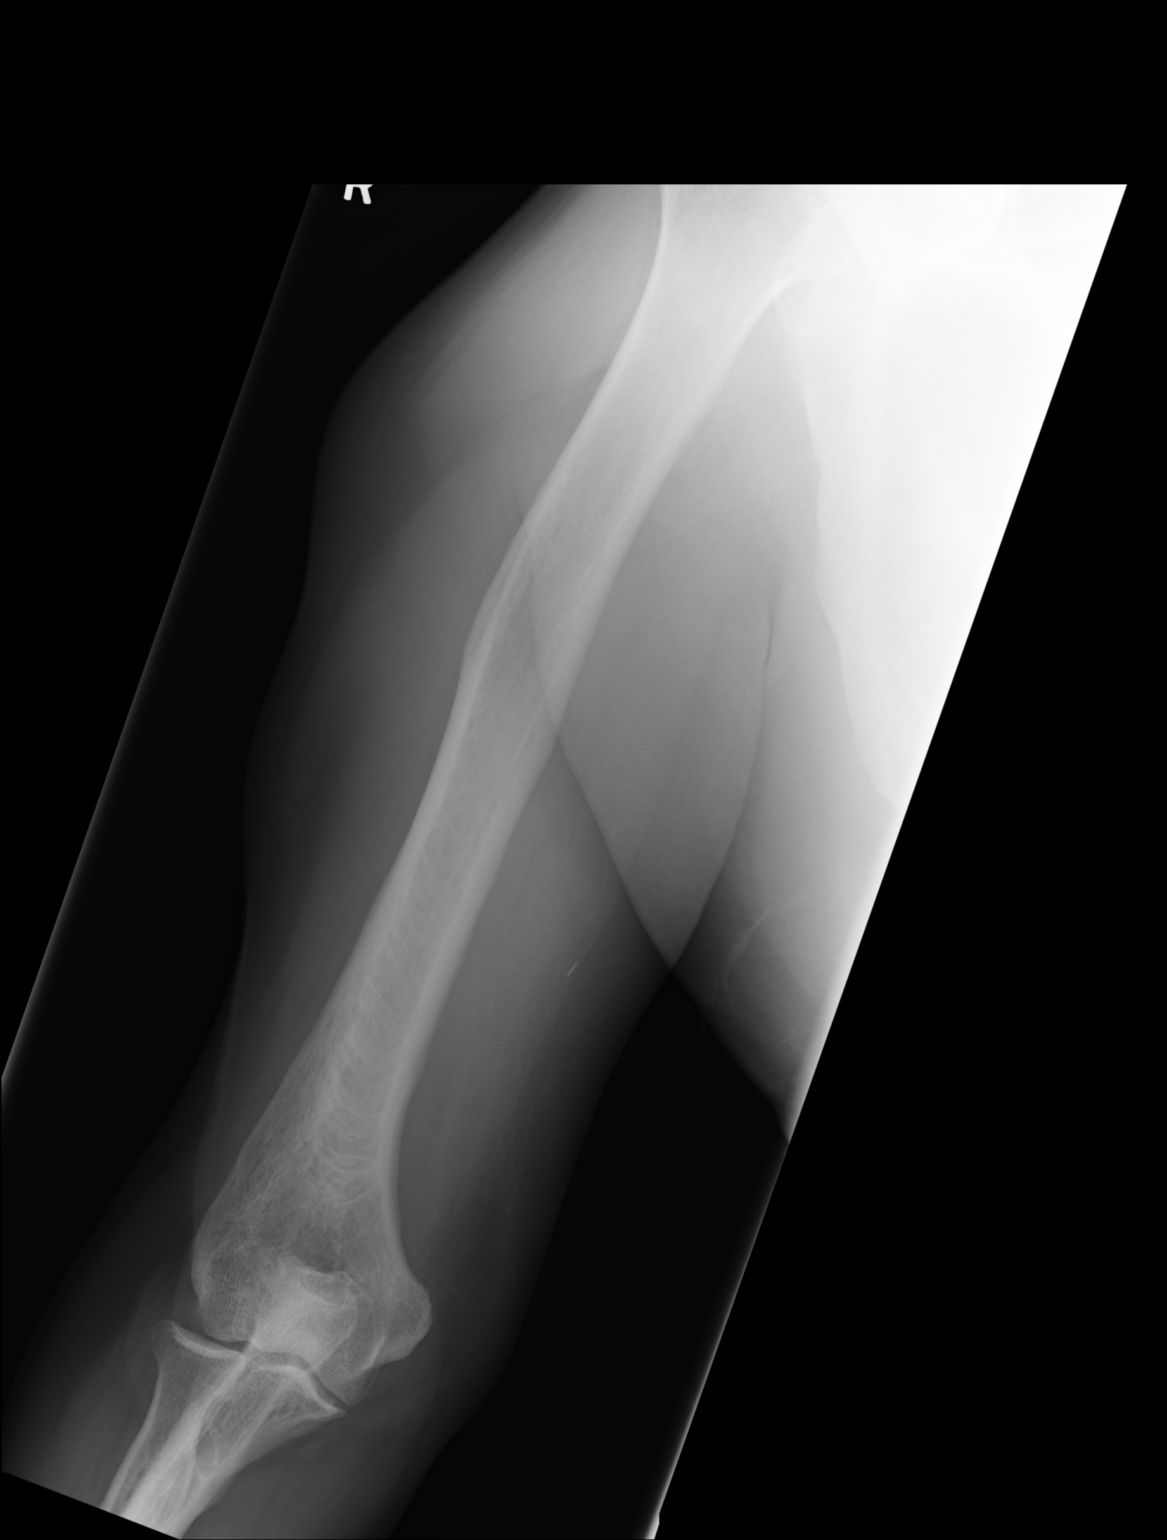
[im 2/2]
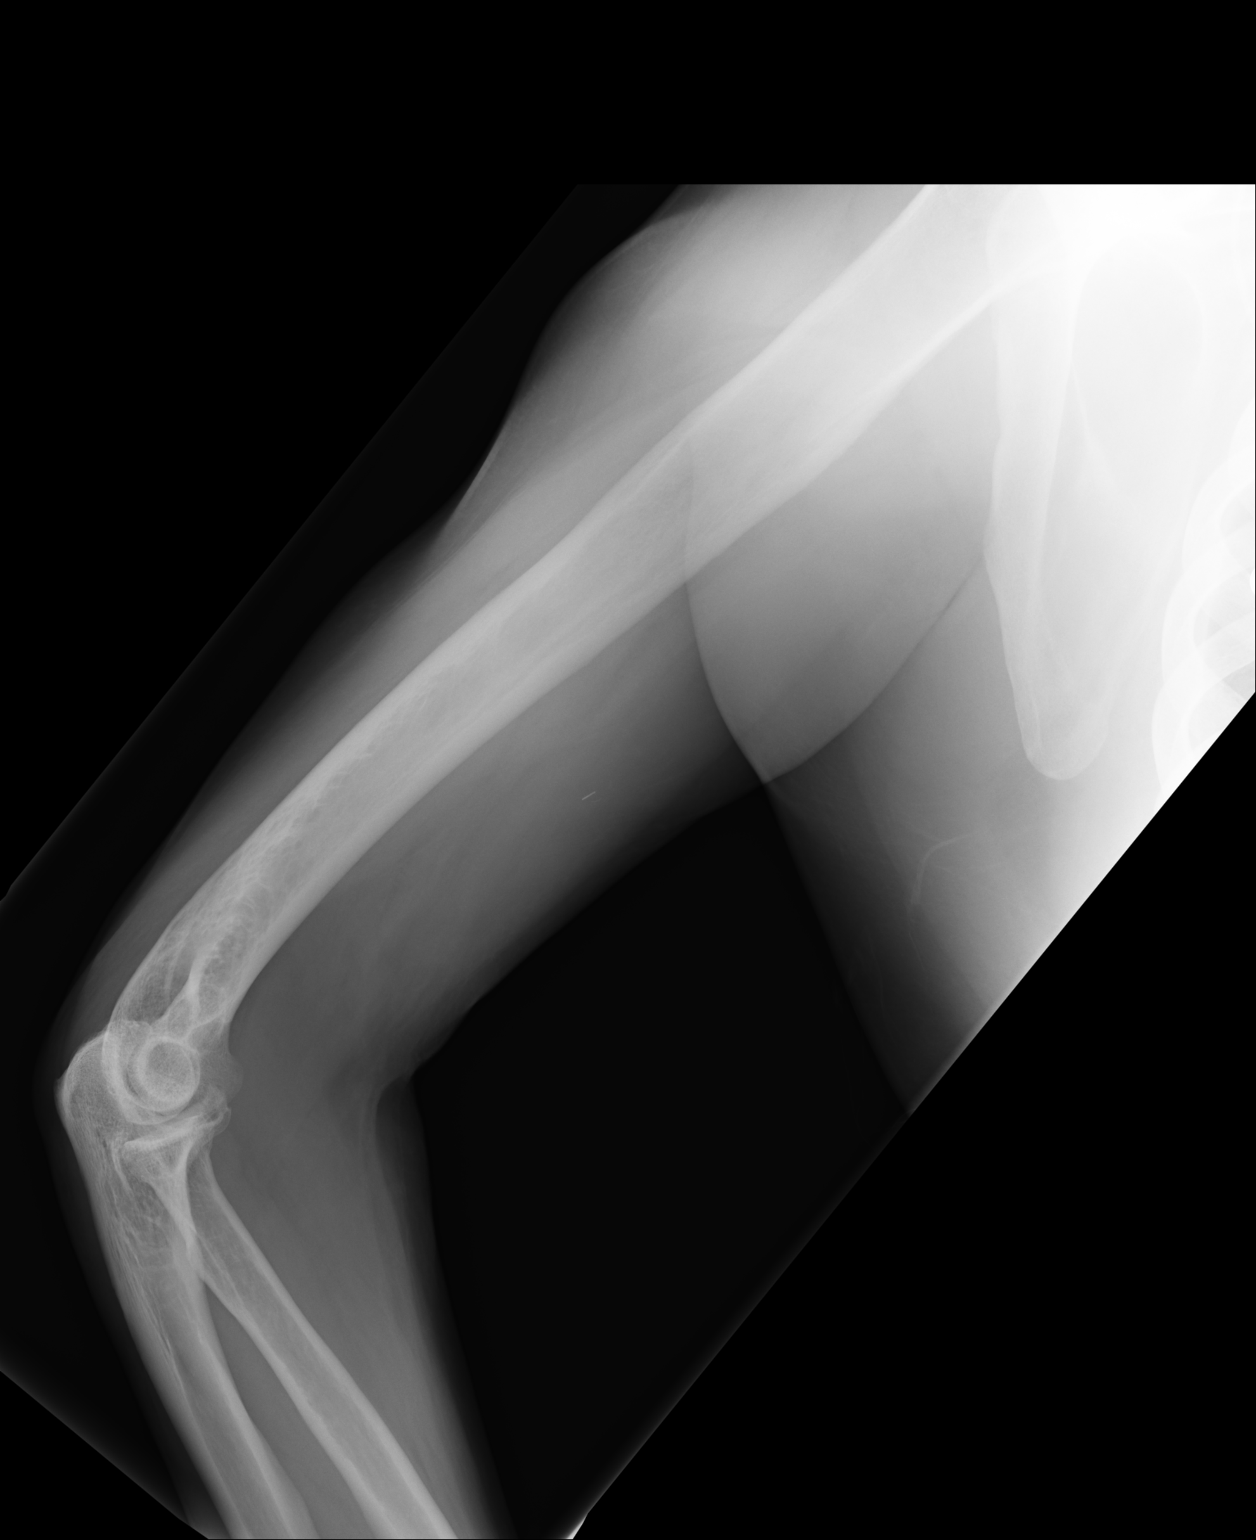

[2 of 2 positions shown; findings below may reference images not displayed]

FINDINGS: There is no evidence of fracture or other focal bone lesions.

A small linear radiopaque foreign body measuring approximately 4 mm
in length is seen in the anterior and medial soft tissues along the
mid to distal humeral diaphysis.
IMPRESSION: 4 mm linear radiopaque foreign body in anterior and medial soft
tissues at the level of the mid to distal humeral diaphysis.

No osseous abnormality.

## 2016-10-16 MED ORDER — CARVEDILOL 25 MG PO TABS: 25.0000 mg | ORAL_TABLET | Freq: Two times a day (BID) | ORAL | 0 refills | Status: DC

## 2016-10-19 ENCOUNTER — Other Ambulatory Visit: Payer: Self-pay

## 2016-10-19 MED ORDER — CARVEDILOL 25 MG PO TABS: 25.0000 mg | ORAL_TABLET | Freq: Two times a day (BID) | ORAL | 0 refills | Status: DC

## 2016-10-19 NOTE — Telephone Encounter (Signed)
3 rd notice,need fu apt, messaged pharmacy, refilled 2 week supply

## 2016-10-26 DIAGNOSIS — I82401 Acute embolism and thrombosis of unspecified deep veins of right lower extremity: Secondary | ICD-10-CM | POA: Diagnosis not present

## 2016-10-26 DIAGNOSIS — K7030 Alcoholic cirrhosis of liver without ascites: Secondary | ICD-10-CM | POA: Diagnosis not present

## 2016-10-26 DIAGNOSIS — I10 Essential (primary) hypertension: Secondary | ICD-10-CM | POA: Diagnosis not present

## 2016-10-26 DIAGNOSIS — Z0000 Encounter for general adult medical examination without abnormal findings: Secondary | ICD-10-CM | POA: Diagnosis not present

## 2016-10-26 DIAGNOSIS — I5022 Chronic systolic (congestive) heart failure: Secondary | ICD-10-CM | POA: Diagnosis not present

## 2016-10-26 DIAGNOSIS — E1165 Type 2 diabetes mellitus with hyperglycemia: Secondary | ICD-10-CM | POA: Diagnosis not present

## 2016-10-26 DIAGNOSIS — Z1389 Encounter for screening for other disorder: Secondary | ICD-10-CM | POA: Diagnosis not present

## 2017-04-22 ENCOUNTER — Emergency Department (HOSPITAL_COMMUNITY): Payer: Medicare Other

## 2017-04-22 ENCOUNTER — Encounter (HOSPITAL_COMMUNITY): Payer: Self-pay | Admitting: Emergency Medicine

## 2017-04-22 ENCOUNTER — Emergency Department (HOSPITAL_COMMUNITY)
Admission: EM | Admit: 2017-04-22 | Discharge: 2017-04-22 | Disposition: A | Discharge status: Home / Self Care | Payer: Medicare Other | Attending: Emergency Medicine | Admitting: Emergency Medicine

## 2017-04-22 DIAGNOSIS — I5041 Acute combined systolic (congestive) and diastolic (congestive) heart failure: Secondary | ICD-10-CM | POA: Diagnosis not present

## 2017-04-22 DIAGNOSIS — Z79899 Other long term (current) drug therapy: Secondary | ICD-10-CM | POA: Diagnosis not present

## 2017-04-22 DIAGNOSIS — M1000 Idiopathic gout, unspecified site: Secondary | ICD-10-CM

## 2017-04-22 DIAGNOSIS — E119 Type 2 diabetes mellitus without complications: Secondary | ICD-10-CM | POA: Diagnosis not present

## 2017-04-22 DIAGNOSIS — Z794 Long term (current) use of insulin: Secondary | ICD-10-CM | POA: Diagnosis not present

## 2017-04-22 DIAGNOSIS — S99912A Unspecified injury of left ankle, initial encounter: Secondary | ICD-10-CM | POA: Diagnosis not present

## 2017-04-22 DIAGNOSIS — M25572 Pain in left ankle and joints of left foot: Secondary | ICD-10-CM | POA: Diagnosis not present

## 2017-04-22 DIAGNOSIS — I110 Hypertensive heart disease with heart failure: Secondary | ICD-10-CM | POA: Insufficient documentation

## 2017-04-22 DIAGNOSIS — R0602 Shortness of breath: Secondary | ICD-10-CM | POA: Diagnosis not present

## 2017-04-22 DIAGNOSIS — M10072 Idiopathic gout, left ankle and foot: Secondary | ICD-10-CM | POA: Diagnosis not present

## 2017-04-22 LAB — CBC WITH DIFFERENTIAL/PLATELET
Basophils Absolute: 0 10*3/uL (ref 0.0–0.1)
Basophils Relative: 0 %
Eosinophils Absolute: 0.2 10*3/uL (ref 0.0–0.7)
Eosinophils Relative: 3 %
HEMATOCRIT: 39.5 % (ref 39.0–52.0)
Hemoglobin: 13.3 g/dL (ref 13.0–17.0)
LYMPHS PCT: 26 %
Lymphs Abs: 1.8 10*3/uL (ref 0.7–4.0)
MCH: 31.2 pg (ref 26.0–34.0)
MCHC: 33.7 g/dL (ref 30.0–36.0)
MCV: 92.7 fL (ref 78.0–100.0)
MONOS PCT: 7 %
Monocytes Absolute: 0.5 10*3/uL (ref 0.1–1.0)
NEUTROS ABS: 4.5 10*3/uL (ref 1.7–7.7)
NEUTROS PCT: 64 %
Platelets: 146 10*3/uL — ABNORMAL LOW (ref 150–400)
RBC: 4.26 MIL/uL (ref 4.22–5.81)
RDW: 12.6 % (ref 11.5–15.5)
WBC: 7 10*3/uL (ref 4.0–10.5)

## 2017-04-22 LAB — BASIC METABOLIC PANEL
Anion gap: 12 (ref 5–15)
BUN: 27 mg/dL — AB (ref 6–20)
CHLORIDE: 101 mmol/L (ref 101–111)
CO2: 21 mmol/L — AB (ref 22–32)
CREATININE: 1.96 mg/dL — AB (ref 0.61–1.24)
Calcium: 9.2 mg/dL (ref 8.9–10.3)
GFR calc Af Amer: 40 mL/min — ABNORMAL LOW (ref >60)
GFR calc non Af Amer: 34 mL/min — ABNORMAL LOW (ref >60)
GLUCOSE: 186 mg/dL — AB (ref 65–99)
POTASSIUM: 4.5 mmol/L (ref 3.5–5.1)
SODIUM: 134 mmol/L — AB (ref 135–145)

## 2017-04-22 LAB — URIC ACID: Uric Acid, Serum: 11.5 mg/dL — ABNORMAL HIGH (ref 4.4–7.6)

## 2017-04-22 MED ORDER — DEXAMETHASONE 4 MG PO TABS: 4.0000 mg | ORAL_TABLET | Freq: Two times a day (BID) | ORAL | 0 refills | Status: AC

## 2017-04-22 MED ORDER — HYDROCODONE-ACETAMINOPHEN 5-325 MG PO TABS: 1.0000 | ORAL_TABLET | ORAL | 0 refills | Status: AC | PRN

## 2017-04-22 MED ORDER — COLCHICINE 0.6 MG PO TABS
1.2000 mg | ORAL_TABLET | Freq: Once | ORAL | Status: AC
  Administered 2017-04-22: 1.2 mg via ORAL
  Filled 2017-04-22: qty 2

## 2017-04-22 MED ORDER — COLCHICINE 0.6 MG PO TABS: 0.6000 mg | ORAL_TABLET | Freq: Every day | ORAL | 0 refills | Status: AC

## 2017-04-22 MED ORDER — DEXAMETHASONE SODIUM PHOSPHATE 10 MG/ML IJ SOLN
10.0000 mg | Freq: Once | INTRAMUSCULAR | Status: AC
  Administered 2017-04-22: 10 mg via INTRAMUSCULAR
  Filled 2017-04-22: qty 1

## 2017-04-22 NOTE — Discharge Instructions (Addendum)
Your examination suggest that you have gout arthritis in your ankle.  Please use colchicine daily with food.  Use Decadron 2 times daily with food.  Use Norco every 4 hours as needed for pain. This medication may cause drowsiness. Please do not drink, drive, or participate in activity that requires concentration while taking this medication.  Please see Dr. Felecia ShellingFanta next week for follow-up of your gout.

## 2017-04-22 NOTE — ED Notes (Signed)
Pt returned from xray

## 2017-04-22 NOTE — ED Triage Notes (Signed)
Pt reports left ankle pain and swelling for a week.  Fall 3 weeks ago.

## 2017-04-22 NOTE — ED Provider Notes (Addendum)
[facility] EMERGENCY DEPARTMENT Provider Note   CSN: [id number] Arrival date & time: 04/22/17  [id number]     History   Chief Complaint No chief complaint on file.   HPI Gabriel Reynolds is a 62 y.o. male.  Onset of soreness 3 weeks ago.  The history is provided by the patient.  Ankle Pain   The incident occurred more than 1 week ago. The incident occurred at home. The injury mechanism was a fall. The pain is present in the left ankle. The quality of the pain is described as aching. The pain is moderate. The pain has been worsening since onset. Associated symptoms include inability to bear weight. He reports no foreign bodies present. Exacerbated by: touching the left ankle. He has tried nothing for the symptoms.    Past Medical History:  Diagnosis Date  . Acute combined systolic and diastolic CHF, NYHA class 4 (HCC) 04/13/2014  . Alcohol abuse 04/28/2014  . Angioedema 04/28/2014  . Atrial flutter (HCC) 04/28/2014  . Bacteremia due to Streptococcus 04/28/2014  . Diabetes mellitus   . DVT (deep venous thrombosis) (HCC)   . Foreign body (FB) in soft tissue 04/30/2014   Right upper arm soft tissue, failed PICC line placement, retained guide-wire fragment.   . Hepatic cirrhosis (HCC) 04/28/2014  . Hypertension   . Streptococcus viridans infection 04/28/2014    Patient Active Problem List   Diagnosis Date Noted  . Foreign body (FB) in soft tissue 04/30/2014  . Atrial flutter (HCC) 04/28/2014  . AKI (acute kidney injury) (HCC) 04/28/2014  . Streptococcus viridans infection 04/28/2014  . Bacteremia due to Streptococcus 04/28/2014  . Hepatic cirrhosis (HCC) 04/28/2014  . Alcohol abuse 04/28/2014  . Angioedema 04/28/2014  . DVT (deep venous thrombosis) (HCC)   . Acute combined systolic and diastolic CHF, NYHA class 4 (HCC) 04/13/2014    Past Surgical History:  Procedure Laterality Date  . HAND SURGERY          Home Medications    Prior to Admission medications   Medication Sig Start  Date End Date Taking? Authorizing Provider  b complex vitamins tablet Take 1 tablet by mouth daily.    [provider]  carvedilol (COREG) 25 MG tablet Take 1 tablet (25 mg total) by mouth 2 (two) times daily. 10/19/16   Antoine PocheBranch, Jonathan F, MD  ELIQUIS 5 MG TABS tablet TAKE 1 TABLET(5 MG) BY MOUTH TWICE DAILY 05/28/16   Antoine PocheBranch, Jonathan F, MD  folic acid (FOLVITE) 1 MG tablet Take 1 tablet (1 mg total) by mouth daily. 04/30/14   Standley BrookingGoodrich, Daniel P, MD  insulin detemir (LEVEMIR) 100 UNIT/ML injection Inject 0.05 mLs (5 Units total) into the skin daily. Patient taking differently: Inject 50 Units into the skin daily.  04/30/14   Standley BrookingGoodrich, Daniel P, MD  potassium chloride (KLOR-CON) 20 MEQ packet Take 20 mEq by mouth daily.    [provider]  torsemide (DEMADEX) 20 MG tablet TAKE 1 TABLET(20 MG) BY MOUTH DAILY 08/13/16   Antoine PocheBranch, Jonathan F, MD    Family History Family History  Problem Relation Age of Onset  . Diabetes Mother     Social History Social History   Tobacco Use  . Smoking status: Never Smoker  . Smokeless tobacco: Never Used  Substance Use Topics  . Alcohol use: Yes    Alcohol/week: 4.2 oz    Types: 7 Cans of beer per week  . Drug use: No     Allergies   Ace inhibitors  and Other   Review of Systems Review of Systems  Constitutional: Negative for activity change.       All ROS Neg except as noted in HPI  HENT: Negative for nosebleeds.   Eyes: Negative for photophobia and discharge.  Respiratory: Positive for shortness of breath. Negative for cough and wheezing.   Cardiovascular: Negative for chest pain, palpitations and leg swelling.  Gastrointestinal: Negative for abdominal pain and blood in stool.  Genitourinary: Negative for dysuria, frequency and hematuria.  Musculoskeletal: Positive for arthralgias. Negative for back pain and neck pain.  Skin: Negative.   Neurological: Negative for dizziness, seizures and speech difficulty.    Psychiatric/Behavioral: Negative for confusion and hallucinations.     Physical Exam Updated Vital Signs BP 104/61 (BP Location: Right Arm)   Pulse 90   Temp 98.5 F (36.9 C) (Oral)   Resp 16   SpO2 98%   Physical Exam  Constitutional: He is oriented to person, place, and time. He appears well-developed and well-nourished.  Non-toxic appearance.  HENT:  Head: Normocephalic.  Right Ear: Tympanic membrane and external ear normal.  Left Ear: Tympanic membrane and external ear normal.  Eyes: Pupils are equal, round, and reactive to light. EOM and lids are normal.  Neck: Normal range of motion. Neck supple. Carotid bruit is not present.  Cardiovascular: Normal rate, regular rhythm, normal heart sounds, intact distal pulses and normal pulses.  Pulmonary/Chest: Breath sounds normal. No respiratory distress.  Abdominal: Soft. Bowel sounds are normal. There is no tenderness. There is no guarding.  Musculoskeletal:       Left ankle: He exhibits decreased range of motion. Tenderness. Medial malleolus tenderness found. Achilles tendon normal.  Dorsalis pedis is 2+.  Capillary refill is less than 2 seconds.  Patient has a extremely tender area of the medial malleolus.  Mild swelling present.  No red streaks noted.  No broken skin area appreciated.  Lymphadenopathy:       Head (right side): No submandibular adenopathy present.       Head (left side): No submandibular adenopathy present.    He has no cervical adenopathy.  Neurological: He is alert and oriented to person, place, and time. He has normal strength. No cranial nerve deficit or sensory deficit.  Skin: Skin is warm and dry.  Psychiatric: He has a normal mood and affect. His speech is normal.  Nursing note and vitals reviewed.    ED Treatments / Results  Labs (all labs ordered are listed, but only abnormal results are displayed) Labs Reviewed - No data to display  EKG None  Radiology No results  found.  Procedures Procedures (including critical care time)  Medications Ordered in ED Medications - No data to display   Initial Impression / Assessment and Plan / ED Course  I have reviewed the triage vital signs and the nursing notes.  Pertinent labs & imaging results that were available during my care of the patient were reviewed by me and considered in my medical decision making (see chart for details).      Final Clinical Impressions(s) / ED Diagnoses MDM  Vital signs within normal limits.  Pulse oximetry is 100% on room air.  Complete blood count is well within normal limits.  Uric acid is elevated at 11.5. X-ray of the left ankle shows evidence of remote trauma involving the interosseous membrane.  There is no acute fracture noted.  There is degenerative changes noted of the midfoot, and extensive vascular calcification.  I suspect the patient  is having a gout attack.  Given the patient's current list of medications, will use colchicine and Norco.  Patient is to follow-up with Dr. Felecia Shelling for additional evaluation and management.   Final diagnoses:  Acute idiopathic gout, unspecified site    ED Discharge Orders        Ordered    colchicine 0.6 MG tablet  Daily     04/22/17 1135    HYDROcodone-acetaminophen (NORCO/VICODIN) 5-325 MG tablet  Every 4 hours PRN     04/22/17 1135    dexamethasone (DECADRON) 4 MG tablet  2 times daily with meals     04/22/17 1135       Ivery Quale, PA-C 04/23/17 0942    Ivery Quale, PA-C 04/23/17 9528    Vanetta Mulders, MD 04/29/17 [phone]

## 2017-04-28 DIAGNOSIS — K7030 Alcoholic cirrhosis of liver without ascites: Secondary | ICD-10-CM | POA: Diagnosis not present

## 2017-04-28 DIAGNOSIS — E1165 Type 2 diabetes mellitus with hyperglycemia: Secondary | ICD-10-CM | POA: Diagnosis not present

## 2017-04-28 DIAGNOSIS — I10 Essential (primary) hypertension: Secondary | ICD-10-CM | POA: Diagnosis not present

## 2017-04-28 DIAGNOSIS — I5022 Chronic systolic (congestive) heart failure: Secondary | ICD-10-CM | POA: Diagnosis not present

## 2017-05-10 ENCOUNTER — Other Ambulatory Visit: Payer: Self-pay

## 2017-05-10 MED ORDER — TORSEMIDE 20 MG PO TABS
ORAL_TABLET | ORAL | 2 refills | Status: AC
Start: 2017-05-10 — End: ?

## 2017-05-28 DIAGNOSIS — I4891 Unspecified atrial fibrillation: Secondary | ICD-10-CM | POA: Diagnosis not present

## 2017-05-28 DIAGNOSIS — N183 Chronic kidney disease, stage 3 (moderate): Secondary | ICD-10-CM | POA: Diagnosis not present

## 2017-05-28 DIAGNOSIS — I10 Essential (primary) hypertension: Secondary | ICD-10-CM | POA: Diagnosis not present

## 2017-05-28 DIAGNOSIS — M109 Gout, unspecified: Secondary | ICD-10-CM | POA: Diagnosis not present

## 2017-05-28 DIAGNOSIS — E1129 Type 2 diabetes mellitus with other diabetic kidney complication: Secondary | ICD-10-CM | POA: Diagnosis not present

## 2017-06-01 ENCOUNTER — Other Ambulatory Visit: Payer: Self-pay | Admitting: Cardiology

## 2017-06-01 MED ORDER — CARVEDILOL 25 MG PO TABS: 25.0000 mg | ORAL_TABLET | Freq: Two times a day (BID) | ORAL | 1 refills | Status: AC

## 2017-06-01 NOTE — Telephone Encounter (Signed)
°*  STAT* If patient is at the pharmacy, call can be transferred to refill team.   1. Which medications need to be refilled? (please list name of each medication and dose if known) carvedilol (COREG) 25 MG tablet [[id number]]   2. Which pharmacy/location (including street and city if local pharmacy) is medication to be sent to? [facility]  3. Do they need a 30day or 90 day supply?  90 day  Pt has an apt w/ Dr. Wyline Mood, but not till August

## 2017-06-01 NOTE — Telephone Encounter (Signed)
escribed refill per request 

## 2017-06-26 DIAGNOSIS — 419620001 Death: Secondary | SNOMED CT | POA: Diagnosis not present

## 2017-06-26 DEATH — deceased

## 2017-07-21 ENCOUNTER — Encounter: Payer: Self-pay | Admitting: Cardiology

## 2017-09-03 ENCOUNTER — Ambulatory Visit: Payer: Medicare Other | Admitting: Cardiology
